# Patient Record
Sex: Female | Born: 1990 | Race: White | Hispanic: No | Marital: Single | Smoking: Former smoker
Health system: Southern US, Community
[De-identification: ages and names within clinical notes are randomized; demographics above are authoritative.]

## PROBLEM LIST (undated history)

## (undated) ENCOUNTER — Inpatient Hospital Stay (HOSPITAL_COMMUNITY): Payer: Self-pay

## (undated) DIAGNOSIS — G43909 Migraine, unspecified, not intractable, without status migrainosus: Secondary | ICD-10-CM

## (undated) DIAGNOSIS — N760 Acute vaginitis: Secondary | ICD-10-CM

## (undated) DIAGNOSIS — IMO0002 Reserved for concepts with insufficient information to code with codable children: Secondary | ICD-10-CM

## (undated) DIAGNOSIS — R87629 Unspecified abnormal cytological findings in specimens from vagina: Secondary | ICD-10-CM

## (undated) DIAGNOSIS — B9689 Other specified bacterial agents as the cause of diseases classified elsewhere: Secondary | ICD-10-CM

## (undated) DIAGNOSIS — A749 Chlamydial infection, unspecified: Secondary | ICD-10-CM

## (undated) DIAGNOSIS — N390 Urinary tract infection, site not specified: Secondary | ICD-10-CM

## (undated) DIAGNOSIS — Z8619 Personal history of other infectious and parasitic diseases: Secondary | ICD-10-CM

## (undated) HISTORY — DX: Urinary tract infection, site not specified: N39.0

## (undated) HISTORY — DX: Reserved for concepts with insufficient information to code with codable children: IMO0002

## (undated) HISTORY — DX: Chlamydial infection, unspecified: A74.9

## (undated) HISTORY — DX: Unspecified abnormal cytological findings in specimens from vagina: R87.629

---
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
## 2011-04-17 ENCOUNTER — Inpatient Hospital Stay (HOSPITAL_COMMUNITY)
Admission: AD | Admit: 2011-04-17 | Discharge: 2011-04-17 | Disposition: A | Discharge status: Home / Self Care | Payer: Medicaid Other | Source: Ambulatory Visit | Attending: Obstetrics & Gynecology | Admitting: Obstetrics & Gynecology

## 2011-04-17 ENCOUNTER — Encounter (HOSPITAL_COMMUNITY): Payer: Self-pay | Admitting: *Deleted

## 2011-04-17 ENCOUNTER — Inpatient Hospital Stay (HOSPITAL_COMMUNITY): Payer: Medicaid Other

## 2011-04-17 DIAGNOSIS — A499 Bacterial infection, unspecified: Secondary | ICD-10-CM

## 2011-04-17 DIAGNOSIS — B9689 Other specified bacterial agents as the cause of diseases classified elsewhere: Secondary | ICD-10-CM

## 2011-04-17 DIAGNOSIS — N760 Acute vaginitis: Secondary | ICD-10-CM

## 2011-04-17 DIAGNOSIS — O99891 Other specified diseases and conditions complicating pregnancy: Secondary | ICD-10-CM | POA: Insufficient documentation

## 2011-04-17 DIAGNOSIS — O98919 Unspecified maternal infectious and parasitic disease complicating pregnancy, unspecified trimester: Secondary | ICD-10-CM

## 2011-04-17 DIAGNOSIS — R109 Unspecified abdominal pain: Secondary | ICD-10-CM | POA: Insufficient documentation

## 2011-04-17 DIAGNOSIS — IMO0002 Reserved for concepts with insufficient information to code with codable children: Secondary | ICD-10-CM

## 2011-04-17 LAB — DIFFERENTIAL
Basophils Absolute: 0 10*3/uL (ref 0.0–0.1)
Basophils Relative: 0 % (ref 0–1)
Monocytes Relative: 10 % (ref 3–12)
Neutro Abs: 5.7 10*3/uL (ref 1.7–7.7)
Neutrophils Relative %: 54 % (ref 43–77)

## 2011-04-17 LAB — URINALYSIS, ROUTINE W REFLEX MICROSCOPIC
Bilirubin Urine: NEGATIVE
Ketones, ur: NEGATIVE mg/dL
Nitrite: NEGATIVE
pH: 6.5 (ref 5.0–8.0)

## 2011-04-17 LAB — HCG, QUANTITATIVE, PREGNANCY: hCG, Beta Chain, Quant, S: 17935 m[IU]/mL — ABNORMAL HIGH (ref <5)

## 2011-04-17 LAB — CBC
Hemoglobin: 14.2 g/dL (ref 12.0–15.0)
MCHC: 34.5 g/dL (ref 30.0–36.0)
RDW: 13.4 % (ref 11.5–15.5)

## 2011-04-17 LAB — WET PREP, GENITAL

## 2011-04-17 MED ORDER — METRONIDAZOLE 500 MG PO TABS: 500.0000 mg | ORAL_TABLET | Freq: Two times a day (BID) | ORAL | Status: AC

## 2011-04-17 MED ORDER — PROMETHAZINE HCL 25 MG PO TABS: 25.0000 mg | ORAL_TABLET | Freq: Four times a day (QID) | ORAL | Status: DC | PRN

## 2011-04-17 NOTE — Progress Notes (Signed)
Pt reports bleeding after intercourse 1 week ago. Pt also reports breast pain for 2 weeks and abdominal pain for 3 days.

## 2011-04-17 NOTE — ED Provider Notes (Signed)
History     CSN: [id number] Arrival date & time: 04/17/2011 12:16 AM  Chief Complaint  Patient presents with  . Abdominal Pain  . Breast Pain    HPI    HPI Courtney Strong is a 20 y.o. female who presents to [facility]U for abdominal pain in early pregnancy. She states she had a positive HPT and is concerned because of the amount of ETOH use she has had recently.  History reviewed. No pertinent past medical history.  Past Surgical History  Procedure Date  . Cesarean section     No family history on file.  History  Substance Use Topics  . Smoking status: Current Some Day Smoker -- 1.5 packs/day for 10 years    Types: Cigarettes  . Smokeless tobacco: Not on file  . Alcohol Use: Yes     social drinker, has been drinking recently - prior to knowing about pregnancy    OB History    Grav Para Term Preterm Abortions TAB SAB Ect Mult Living   2 1        1       Review of Systems  Review of Systems  Constitutional: Negative for fever, chills, diaphoresis and fatigue.  HENT: Negative for ear pain, congestion, sore throat, facial swelling, neck pain, neck stiffness, dental problem and sinus pressure.   Eyes: Negative for photophobia, pain and discharge.  Respiratory: Negative for cough, chest tightness and wheezing.   Gastrointestinal: Positive for nausea and abdominal pain. Negative for vomiting, diarrhea, constipation and abdominal distention.  Genitourinary: Positive for frequency and vaginal discharge. Negative for dysuria, flank pain, vaginal bleeding and difficulty urinating.  Musculoskeletal: Negative for myalgias, back pain and gait problem.  Skin: Negative for color change and rash.  Neurological: Negative for dizziness, speech difficulty, weakness, light-headedness, numbness and headaches.  Psychiatric/Behavioral: Negative for confusion and agitation.    Allergies  Review of patient's allergies indicates no known allergies.  Home Medications  No current outpatient  prescriptions on file.  Physical Exam    BP 131/65  Pulse 85  Temp(Src) 98.3 F (36.8 C) (Oral)  Resp 16  Ht 5\' 6"  (1.676 m)  Wt 150 lb 3.2 oz (68.13 kg)  BMI 24.24 kg/m2  LMP 01/10/2011  Breastfeeding? Unknown  Physical Exam  Nursing note and vitals reviewed. Constitutional: She is oriented to person, place, and time. She appears well-developed and well-nourished.  HENT:  Head: Normocephalic.  Eyes: EOM are normal.  Neck: Neck supple.  Pulmonary/Chest: Effort normal.  Abdominal: Soft. There is no tenderness.  Genitourinary:       Frothy vaginal discharge. Cervix closed, inflamed, no CMT, no adnexal tenderness. Uterus slightly enlarged.   Musculoskeletal: Normal range of motion.  Neurological: She is alert and oriented to person, place, and time. No cranial nerve deficit.  Skin: Skin is warm and dry.  Psychiatric: She has a normal mood and affect.    ED Course  Procedures  Results for orders placed during the hospital encounter of 04/17/11 (from the past 24 hour(s))  URINALYSIS, ROUTINE W REFLEX MICROSCOPIC     Status: Abnormal   Collection Time   04/17/11 12:53 AM      Component Value Range   Color, Urine YELLOW  YELLOW    Appearance CLEAR  CLEAR    Specific Gravity, Urine <1.005 (*) 1.005 - 1.030    pH 6.5  5.0 - 8.0    Glucose, UA NEGATIVE  NEGATIVE (mg/dL)   Hgb urine dipstick NEGATIVE  NEGATIVE  Bilirubin Urine NEGATIVE  NEGATIVE    Ketones, ur NEGATIVE  NEGATIVE (mg/dL)   Protein, ur NEGATIVE  NEGATIVE (mg/dL)   Urobilinogen, UA 0.2  0.0 - 1.0 (mg/dL)   Nitrite NEGATIVE  NEGATIVE    Leukocytes, UA NEGATIVE  NEGATIVE   POCT PREGNANCY, URINE     Status: Normal   Collection Time   04/17/11 12:55 AM      Component Value Range   Preg Test, Ur POSITIVE    CBC     Status: Abnormal   Collection Time   04/17/11  1:35 AM      Component Value Range   WBC 10.6 (*) 4.0 - 10.5 (K/uL)   RBC 4.71  3.87 - 5.11 (MIL/uL)   Hemoglobin 14.2  12.0 - 15.0 (g/dL)   HCT  16.1  09.6 - 04.5 (%)   MCV 87.3  78.0 - 100.0 (fL)   MCH 30.1  26.0 - 34.0 (pg)   MCHC 34.5  30.0 - 36.0 (g/dL)   RDW 40.9  81.1 - 91.4 (%)   Platelets 209  150 - 400 (K/uL)  DIFFERENTIAL     Status: Normal   Collection Time   04/17/11  1:35 AM      Component Value Range   Neutrophils Relative 54  43 - 77 (%)   Neutro Abs 5.7  1.7 - 7.7 (K/uL)   Lymphocytes Relative 34  12 - 46 (%)   Lymphs Abs 3.6  0.7 - 4.0 (K/uL)   Monocytes Relative 10  3 - 12 (%)   Monocytes Absolute 1.0  0.1 - 1.0 (K/uL)   Eosinophils Relative 2  0 - 5 (%)   Eosinophils Absolute 0.2  0.0 - 0.7 (K/uL)   Basophils Relative 0  0 - 1 (%)   Basophils Absolute 0.0  0.0 - 0.1 (K/uL)  HCG, QUANTITATIVE, PREGNANCY     Status: Abnormal   Collection Time   04/17/11  1:35 AM      Component Value Range   hCG, Beta Chain, Quant, S 17935 (*) <5 (mIU/mL)      *RADIOLOGY REPORT*  Clinical Data: Pelvic pain.  OBSTETRIC <14 WK [place] AND TRANSVAGINAL OB US  Technique: Both transabdominal and transvaginal ultrasound  examinations were performed for complete evaluation of the  gestation as well as the maternal uterus, adnexal regions, and  pelvic cul-de-sac. Transvaginal technique was performed to assess  early pregnancy.  Comparison: None.  Intrauterine gestational sac: Visualized/normal in shape.  Yolk sac: Yes  Embryo: Yes  Cardiac Activity: Yes  Heart Rate: 111 bpm  CRL: 4.4 mm 6 w 1 d [place] EDC: 12/10/2011  Maternal uterus/adnexae:  A small to moderate amount of subchorionic hemorrhage is noted.  The uterus otherwise unremarkable in appearance.  The ovaries are unremarkable in appearance. The right ovary  measures 2.9 x 1.9 x 1.7 cm, while the left ovary measures 2.8 x  2.3 x 1.9 cm. No suspicious adnexal masses are seen; there is no  evidence for ovarian torsion.  No free fluid is seen within the pelvic cul-de-sac.  IMPRESSION:  1. Single live intrauterine pregnancy, with a crown-rump length of  4.4 mm,  corresponding to a gestational age of [redacted] weeks 1 day. This  does not match the gestational age of [redacted] weeks 1 day by LMP, and  reflects a new estimated date of delivery of Dec 10, 2011.  2. Small to moderate amount of subchorionic hemorrhage noted.  Original Report Authenticated By: Tonia Ghent, M.D.  Assessment: Viable IUP  Plan:  Start prenatal care   Stop ETOH use          [place], [place] 04/17/11 [phone]

## 2011-04-17 NOTE — Progress Notes (Signed)
Pt G1 P1, unsure LMP, having lower abd x 4 days and breast pain 2-3 wks.

## 2011-04-18 LAB — GC/CHLAMYDIA PROBE AMP, GENITAL: Chlamydia, DNA Probe: NEGATIVE

## 2011-04-25 LAB — POCT URINALYSIS DIP (DEVICE)
Nitrite: NEGATIVE
Operator id: 29721
Protein, ur: 30 mg/dL — AB
Urobilinogen, UA: 0.2 mg/dL (ref 0.0–1.0)

## 2011-04-25 LAB — POCT PREGNANCY, URINE: Preg Test, Ur: NEGATIVE

## 2011-05-04 ENCOUNTER — Encounter (HOSPITAL_COMMUNITY): Payer: Self-pay | Admitting: *Deleted

## 2011-05-04 ENCOUNTER — Inpatient Hospital Stay (HOSPITAL_COMMUNITY)
Admission: AD | Admit: 2011-05-04 | Discharge: 2011-05-05 | Disposition: A | Discharge status: Home / Self Care | Payer: Medicaid Other | Source: Ambulatory Visit | Attending: Obstetrics & Gynecology | Admitting: Obstetrics & Gynecology

## 2011-05-04 DIAGNOSIS — M545 Low back pain: Secondary | ICD-10-CM

## 2011-05-04 DIAGNOSIS — T148XXA Other injury of unspecified body region, initial encounter: Secondary | ICD-10-CM

## 2011-05-04 DIAGNOSIS — O2340 Unspecified infection of urinary tract in pregnancy, unspecified trimester: Secondary | ICD-10-CM

## 2011-05-04 DIAGNOSIS — O2390 Unspecified genitourinary tract infection in pregnancy, unspecified trimester: Secondary | ICD-10-CM | POA: Insufficient documentation

## 2011-05-04 DIAGNOSIS — N390 Urinary tract infection, site not specified: Secondary | ICD-10-CM | POA: Insufficient documentation

## 2011-05-04 DIAGNOSIS — M549 Dorsalgia, unspecified: Secondary | ICD-10-CM | POA: Insufficient documentation

## 2011-05-04 MED ORDER — OXYCODONE-ACETAMINOPHEN 5-325 MG PO TABS
1.0000 | ORAL_TABLET | Freq: Once | ORAL | Status: AC
  Administered 2011-05-04: 1 via ORAL
  Filled 2011-05-04: qty 1

## 2011-05-04 MED ORDER — CYCLOBENZAPRINE HCL 10 MG PO TABS
10.0000 mg | ORAL_TABLET | Freq: Once | ORAL | Status: AC
  Administered 2011-05-04: 10 mg via ORAL
  Filled 2011-05-04: qty 1

## 2011-05-04 NOTE — Progress Notes (Signed)
PT SAYS  TODAY AT 1PM   WAS HELPING  HER DAD PICK UP A 75 LB TOOLBOX- MOVED AND NOW  LEFT LOWER BACK HURTS- HAS BEEN  USING HEATING PAD -  WITH NO RELIEF. NO PAIN MED.   WENT TO DR MARSHALL WITH LAST PREG-  PLANS TO MAKE APPOINTMENT  TOMORROW WITH [facility]

## 2011-05-04 NOTE — Progress Notes (Signed)
PT  WAS HERE IN SEPT- FOR PAIN- WE TOLD HER - PREG.

## 2011-05-04 NOTE — ED Provider Notes (Signed)
History     No chief complaint on file.  HPI G2P1 at [redacted]w[redacted]d. Picked up heavy tool box earlier, left lower back began hurting and twisting/bending movement became limited shortly thereafter. Pain seems to be directly related to lifting - no dysuria, fever, chills, nausea, vomiting.  OB History    Grav Para Term Preterm Abortions TAB SAB Ect Mult Living   2 1        1       History reviewed. No pertinent past medical history.  Past Surgical History  Procedure Date  . Cesarean section     History reviewed. No pertinent family history.  History  Substance Use Topics  . Smoking status: Former Smoker -- 1.5 packs/day for 10 years    Types: Cigarettes    Quit date: 05/03/2011  . Smokeless tobacco: Not on file  . Alcohol Use: Yes     social drinker, has been drinking in September - prior to knowing about pregnancy    Allergies: No Known Allergies  No prescriptions prior to admission    Review of Systems  Respiratory: Negative.   Cardiovascular: Negative.   Gastrointestinal: Negative.  Negative for nausea and vomiting.  Genitourinary: Negative.  Negative for dysuria, frequency and hematuria.  Musculoskeletal: Positive for back pain.  Neurological: Negative.   Psychiatric/Behavioral: Negative.    Physical Exam   Blood pressure 107/41, pulse 73, temperature 98.1 F (36.7 C), temperature source Oral, resp. rate 20, height 5\' 4"  (1.626 m), weight 155 lb 4 oz (70.421 kg), last menstrual period 01/10/2011, unknown if currently breastfeeding.  Physical Exam  Nursing note and vitals reviewed. Constitutional: She is oriented to person, place, and time. She appears well-developed and well-nourished. She appears distressed.  Cardiovascular: Normal rate.   Respiratory: Effort normal.  GI: There is no CVA tenderness.  Musculoskeletal:       Lumbar back: She exhibits decreased range of motion, tenderness, pain and spasm.       Back:  Neurological: She is alert and oriented to  person, place, and time.  Skin: Skin is warm and dry.  Psychiatric: She has a normal mood and affect.    MAU Course  Procedures  Results for orders placed during the hospital encounter of 05/04/11 (from the past 24 hour(s))  URINALYSIS, ROUTINE W REFLEX MICROSCOPIC     Status: Abnormal   Collection Time   05/04/11 11:25 PM      Component Value Range   Color, Urine YELLOW  YELLOW    Appearance HAZY (*) CLEAR    Specific Gravity, Urine 1.025  1.005 - 1.030    pH 6.0  5.0 - 8.0    Glucose, UA NEGATIVE  NEGATIVE (mg/dL)   Hgb urine dipstick NEGATIVE  NEGATIVE    Bilirubin Urine NEGATIVE  NEGATIVE    Ketones, ur NEGATIVE  NEGATIVE (mg/dL)   Protein, ur NEGATIVE  NEGATIVE (mg/dL)   Urobilinogen, UA 2.0 (*) 0.0 - 1.0 (mg/dL)   Nitrite POSITIVE (*) NEGATIVE    Leukocytes, UA NEGATIVE  NEGATIVE   URINE MICROSCOPIC-ADD ON     Status: Abnormal   Collection Time   05/04/11 11:25 PM      Component Value Range   Squamous Epithelial / LPF MANY (*) RARE    WBC, UA 3-6  <3 (WBC/hpf)   Bacteria, UA MANY (*) RARE    Urine-Other MUCOUS PRESENT      Improvement in pain after Flexeril, Percocet   Assessment and Plan  Back pain - no CVA  tenderness, likely due to muscle strain/spasm, rx flexeril and percocet, comfort measures rev'd UTI - rx Keflex, urine culture sent, precautions rev'd  Courtney Strong 05/05/2011, 12:11 AM

## 2011-05-05 LAB — URINE MICROSCOPIC-ADD ON

## 2011-05-05 LAB — URINALYSIS, ROUTINE W REFLEX MICROSCOPIC
Ketones, ur: NEGATIVE mg/dL
Leukocytes, UA: NEGATIVE
Nitrite: POSITIVE — AB
Protein, ur: NEGATIVE mg/dL

## 2011-05-05 MED ORDER — OXYCODONE-ACETAMINOPHEN 5-325 MG PO TABS: 1.0000 | ORAL_TABLET | Freq: Four times a day (QID) | ORAL | Status: DC | PRN

## 2011-05-05 MED ORDER — CYCLOBENZAPRINE HCL 10 MG PO TABS: 10.0000 mg | ORAL_TABLET | Freq: Three times a day (TID) | ORAL | Status: AC | PRN

## 2011-05-05 MED ORDER — CEPHALEXIN 500 MG PO CAPS: 500.0000 mg | ORAL_CAPSULE | Freq: Three times a day (TID) | ORAL | Status: AC

## 2011-05-05 NOTE — Progress Notes (Signed)
Pt  Admits to lifting heavy tool box earlier today. Pt states she has been laying in the bed on a heating pad. Since 1400. Pt states pain in back is "excruciating".

## 2011-05-06 NOTE — ED Provider Notes (Signed)
Agree with above note.  Courtney Strong H. 05/06/2011 6:49 AM

## 2011-08-07 ENCOUNTER — Inpatient Hospital Stay (HOSPITAL_COMMUNITY)
Admission: AD | Admit: 2011-08-07 | Discharge: 2011-08-08 | Disposition: A | Discharge status: Home / Self Care | Payer: Medicaid Other | Source: Ambulatory Visit | Attending: Obstetrics & Gynecology | Admitting: Obstetrics & Gynecology

## 2011-08-07 ENCOUNTER — Encounter (HOSPITAL_COMMUNITY): Payer: Self-pay | Admitting: *Deleted

## 2011-08-07 DIAGNOSIS — R109 Unspecified abdominal pain: Secondary | ICD-10-CM | POA: Insufficient documentation

## 2011-08-07 LAB — URINALYSIS, ROUTINE W REFLEX MICROSCOPIC
Bilirubin Urine: NEGATIVE
Nitrite: NEGATIVE
Specific Gravity, Urine: 1.015 (ref 1.005–1.030)
pH: 7 (ref 5.0–8.0)

## 2011-08-07 LAB — URINE MICROSCOPIC-ADD ON

## 2011-08-07 NOTE — Progress Notes (Signed)
Pt states she had a TAB at [facility] on Nov 27th,2012-states has been bleeding since then and has developed a sharp pain in her rt lower groin area

## 2011-08-08 NOTE — Progress Notes (Signed)
Pt called in lobby. No response 

## 2011-08-08 NOTE — Progress Notes (Signed)
Called pt name in lobby. No response.

## 2011-09-22 ENCOUNTER — Emergency Department (HOSPITAL_COMMUNITY)
Admission: EM | Admit: 2011-09-22 | Discharge: 2011-09-23 | Disposition: A | Discharge status: Home / Self Care | Payer: Medicaid Other | Attending: Emergency Medicine | Admitting: Emergency Medicine

## 2011-09-22 ENCOUNTER — Encounter (HOSPITAL_COMMUNITY): Payer: Self-pay | Admitting: *Deleted

## 2011-09-22 DIAGNOSIS — J029 Acute pharyngitis, unspecified: Secondary | ICD-10-CM | POA: Insufficient documentation

## 2011-09-22 DIAGNOSIS — R599 Enlarged lymph nodes, unspecified: Secondary | ICD-10-CM | POA: Insufficient documentation

## 2011-09-22 LAB — RAPID STREP SCREEN (MED CTR MEBANE ONLY): Streptococcus, Group A Screen (Direct): NEGATIVE

## 2011-09-22 NOTE — ED Notes (Signed)
Pt reports having a sore throat since yesterday.  Difficulty and painful swallowing.  Pt sleeping on RN assessment.  Throat noted to be red and inflammed.

## 2011-09-22 NOTE — ED Notes (Signed)
C/o sore throat, difficulty swallowing, R sided anterior neck pain, "like a golf ball" (noted/palpable), also bilateral ear pain, onset last night, (denies: fever, nvd). Has tried dayquil at 1100, aleve at 0600. 10/10 pain, throat moderately swollen and red, no obvious exudate.

## 2011-09-23 ENCOUNTER — Encounter (HOSPITAL_COMMUNITY): Payer: Self-pay | Admitting: *Deleted

## 2011-09-23 MED ORDER — CEPHALEXIN 500 MG PO CAPS: 500.0000 mg | ORAL_CAPSULE | Freq: Four times a day (QID) | ORAL | Status: AC

## 2011-09-23 MED ORDER — PREDNISONE 10 MG PO TABS: 20.0000 mg | ORAL_TABLET | Freq: Two times a day (BID) | ORAL | Status: DC

## 2011-09-23 NOTE — ED Provider Notes (Signed)
History     CSN: [id number]  Arrival date & time 09/22/11  2112   First MD Initiated Contact with Patient 09/23/11 0059      Chief Complaint  Patient presents with  . Sore Throat    (Consider location/radiation/quality/duration/timing/severity/associated sxs/prior treatment) Patient is a 21 y.o. female presenting with pharyngitis. The history is provided by the patient.  Sore Throat This is a new problem. The current episode started yesterday. The problem occurs constantly. The problem has been gradually worsening. The symptoms are aggravated by swallowing. The symptoms are relieved by nothing. She has tried nothing for the symptoms.    History reviewed. No pertinent past medical history.  Past Surgical History  Procedure Date  . Cesarean section     History reviewed. No pertinent family history.  History  Substance Use Topics  . Smoking status: Current Some Day Smoker -- 1.0 packs/day for 10 years    Types: Cigarettes    Last Attempt to Quit: 05/03/2011  . Smokeless tobacco: Not on file  . Alcohol Use: Yes     social drinker, has been drinking in September - prior to knowing about pregnancy    OB History    Grav Para Term Preterm Abortions TAB SAB Ect Mult Living   2 1        1       Review of Systems  All other systems reviewed and are negative.    Allergies  Review of patient's allergies indicates no known allergies.  Home Medications   Current Outpatient Rx  Name Route Sig Dispense Refill  . ASPIRIN-ACETAMINOPHEN-CAFFEINE 250-250-65 MG PO TABS Oral Take 1 tablet by mouth every 6 (six) hours as needed. For pain    . NAPROXEN SODIUM 220 MG PO TABS Oral Take 220 mg by mouth 2 (two) times daily with a meal. For pain      BP 117/68  Pulse 85  Temp(Src) 97.9 F (36.6 C) (Oral)  Resp 16  SpO2 98%  LMP 09/21/2011  Physical Exam  Nursing note and vitals reviewed. Constitutional: She is oriented to person, place, and time. She appears well-developed and  well-nourished. No distress.  HENT:  Head: Normocephalic and atraumatic.  Right Ear: External ear normal.  Left Ear: External ear normal.       The po is erythematous.  No exudates.  Neck: Normal range of motion. Neck supple.  Musculoskeletal: Normal range of motion.  Lymphadenopathy:    She has cervical adenopathy.  Neurological: She is alert and oriented to person, place, and time.  Skin: Skin is warm and dry. She is not diaphoretic.    ED Course  Procedures (including critical care time)   Labs Reviewed  RAPID STREP SCREEN   No results found.   No diagnosis found.    MDM          Geoffery Lyons, MD 09/23/11 [phone]

## 2012-01-02 ENCOUNTER — Inpatient Hospital Stay (HOSPITAL_COMMUNITY)
Admission: AD | Admit: 2012-01-02 | Discharge: 2012-01-03 | Disposition: A | Discharge status: Home / Self Care | Payer: Medicaid Other | Source: Ambulatory Visit | Attending: Family Medicine | Admitting: Family Medicine

## 2012-01-02 ENCOUNTER — Encounter (HOSPITAL_COMMUNITY): Payer: Self-pay

## 2012-01-02 DIAGNOSIS — O99891 Other specified diseases and conditions complicating pregnancy: Secondary | ICD-10-CM | POA: Insufficient documentation

## 2012-01-02 DIAGNOSIS — R197 Diarrhea, unspecified: Secondary | ICD-10-CM | POA: Insufficient documentation

## 2012-01-02 DIAGNOSIS — R109 Unspecified abdominal pain: Secondary | ICD-10-CM | POA: Insufficient documentation

## 2012-01-02 DIAGNOSIS — Z3490 Encounter for supervision of normal pregnancy, unspecified, unspecified trimester: Secondary | ICD-10-CM

## 2012-01-02 DIAGNOSIS — R1031 Right lower quadrant pain: Secondary | ICD-10-CM

## 2012-01-02 DIAGNOSIS — N912 Amenorrhea, unspecified: Secondary | ICD-10-CM | POA: Insufficient documentation

## 2012-01-02 LAB — URINE MICROSCOPIC-ADD ON

## 2012-01-02 LAB — URINALYSIS, ROUTINE W REFLEX MICROSCOPIC
Bilirubin Urine: NEGATIVE
Hgb urine dipstick: NEGATIVE
Ketones, ur: NEGATIVE mg/dL
Nitrite: NEGATIVE
pH: 6 (ref 5.0–8.0)

## 2012-01-02 NOTE — MAU Provider Note (Signed)
History     CSN: [id number]  Arrival date and time: 01/02/12 2249   First Provider Initiated Contact with Patient 01/02/12 2338      Chief Complaint  Patient presents with  . Diarrhea  . Abdominal Pain   HPI This is a 21 y.o. female at [redacted]w[redacted]d who presents with c/o abdominal pain, amenorrhea, and diarrhea for 2 weeks. States had a fever of 103 the other day but none since. Has not been evaluated for any of this.   OB History    Grav Para Term Preterm Abortions TAB SAB Ect Mult Living   3 1   1 1    1       History reviewed. No pertinent past medical history.  Past Surgical History  Procedure Date  . Cesarean section     No family history on file.  History  Substance Use Topics  . Smoking status: Current Some Day Smoker -- 1.0 packs/day for 10 years    Types: Cigarettes    Last Attempt to Quit: 05/03/2011  . Smokeless tobacco: Not on file  . Alcohol Use: Yes     social drinker, has been drinking in September - prior to knowing about pregnancy    Allergies: No Known Allergies  Prescriptions prior to admission  Medication Sig Dispense Refill  . loperamide (IMODIUM) 2 MG capsule Take 2 mg by mouth 4 (four) times daily as needed.      Marland Kitchen aspirin-acetaminophen-caffeine (EXCEDRIN MIGRAINE) 250-250-65 MG per tablet Take 1 tablet by mouth every 6 (six) hours as needed. For pain      . naproxen sodium (ANAPROX) 220 MG tablet Take 220 mg by mouth 2 (two) times daily with a meal. For pain      . predniSONE (DELTASONE) 10 MG tablet Take 2 tablets (20 mg total) by mouth 2 (two) times daily.  20 tablet  0    ROS As listed in HPI  Physical Exam   Blood pressure 123/65, pulse 73, temperature 98.5 F (36.9 C), temperature source Oral, resp. rate 18, height 5\' 6"  (1.676 m), weight 142 lb (64.411 kg), last menstrual period 11/20/2011, SpO2 98.00%.  Physical Exam  Constitutional: She is oriented to person, place, and time. She appears well-developed and well-nourished. No  distress.  HENT:  Head: Normocephalic.  Cardiovascular: Normal rate.   Respiratory: Effort normal.  GI: Soft. She exhibits no distension and no mass. There is no tenderness. There is no rebound and no guarding.  Genitourinary: Vagina normal and uterus normal. No vaginal discharge found.  Musculoskeletal: Normal range of motion.  Neurological: She is alert and oriented to person, place, and time.  Skin: Skin is warm and dry.  Psychiatric: She has a normal mood and affect.    MAU Course  Procedures  MDM Cultures done. Will check CBC and CMET due to prolonged diarrhea.  Results for orders placed during the hospital encounter of 01/02/12 (from the past 24 hour(s))  URINALYSIS, ROUTINE W REFLEX MICROSCOPIC     Status: Abnormal   Collection Time   01/02/12 11:10 PM      Component Value Range   Color, Urine YELLOW  YELLOW   APPearance CLEAR  CLEAR   Specific Gravity, Urine 1.020  1.005 - 1.030   pH 6.0  5.0 - 8.0   Glucose, UA NEGATIVE  NEGATIVE mg/dL   Hgb urine dipstick NEGATIVE  NEGATIVE   Bilirubin Urine NEGATIVE  NEGATIVE   Ketones, ur NEGATIVE  NEGATIVE mg/dL   Protein, ur  NEGATIVE  NEGATIVE mg/dL   Urobilinogen, UA 2.0 (*) 0.0 - 1.0 mg/dL   Nitrite NEGATIVE  NEGATIVE   Leukocytes, UA TRACE (*) NEGATIVE  URINE MICROSCOPIC-ADD ON     Status: Abnormal   Collection Time   01/02/12 11:10 PM      Component Value Range   Squamous Epithelial / LPF FEW (*) RARE   WBC, UA 3-6  <3 WBC/hpf   RBC / HPF 0-2  <3 RBC/hpf   Bacteria, UA RARE  RARE   Urine-Other MUCOUS PRESENT    POCT PREGNANCY, URINE     Status: Abnormal   Collection Time   01/02/12 11:23 PM      Component Value Range   Preg Test, Ur POSITIVE (*) NEGATIVE  WET PREP, GENITAL     Status: Abnormal   Collection Time   01/02/12 11:40 PM      Component Value Range   Yeast Wet Prep HPF POC NONE SEEN  NONE SEEN   Trich, Wet Prep NONE SEEN  NONE SEEN   Clue Cells Wet Prep HPF POC NONE SEEN  NONE SEEN   WBC, Wet Prep HPF POC  MODERATE (*) NONE SEEN  CBC     Status: Normal   Collection Time   01/02/12 11:50 PM      Component Value Range   WBC 8.9  4.0 - 10.5 K/uL   RBC 4.43  3.87 - 5.11 MIL/uL   Hemoglobin 13.2  12.0 - 15.0 g/dL   HCT 16.1  09.6 - 04.5 %   MCV 87.6  78.0 - 100.0 fL   MCH 29.8  26.0 - 34.0 pg   MCHC 34.0  30.0 - 36.0 g/dL   RDW 40.9  81.1 - 91.4 %   Platelets 221  150 - 400 K/uL  DIFFERENTIAL     Status: Normal   Collection Time   01/02/12 11:50 PM      Component Value Range   Neutrophils Relative 50  43 - 77 %   Neutro Abs 4.4  1.7 - 7.7 K/uL   Lymphocytes Relative 37  12 - 46 %   Lymphs Abs 3.2  0.7 - 4.0 K/uL   Monocytes Relative 10  3 - 12 %   Monocytes Absolute 0.9  0.1 - 1.0 K/uL   Eosinophils Relative 3  0 - 5 %   Eosinophils Absolute 0.3  0.0 - 0.7 K/uL   Basophils Relative 1  0 - 1 %   Basophils Absolute 0.0  0.0 - 0.1 K/uL  COMPREHENSIVE METABOLIC PANEL     Status: Abnormal   Collection Time   01/02/12 11:50 PM      Component Value Range   Sodium 137  135 - 145 mEq/L   Potassium 3.5  3.5 - 5.1 mEq/L   Chloride 103  96 - 112 mEq/L   CO2 27  19 - 32 mEq/L   Glucose, Bld 81  70 - 99 mg/dL   BUN 9  6 - 23 mg/dL   Creatinine, Ser 7.82  0.50 - 1.10 mg/dL   Calcium 9.5  8.4 - 95.6 mg/dL   Total Protein 6.8  6.0 - 8.3 g/dL   Albumin 3.9  3.5 - 5.2 g/dL   AST 14  0 - 37 U/L   ALT 11  0 - 35 U/L   Alkaline Phosphatase 57  39 - 117 U/L   Total Bilirubin 0.2 (*) 0.3 - 1.2 mg/dL   GFR calc non Af Amer >90  >  90 mL/min   GFR calc Af Amer >90  >90 mL/min  HCG, QUANTITATIVE, PREGNANCY     Status: Abnormal   Collection Time   01/02/12 11:50 PM      Component Value Range   hCG, Beta Chain, Quant, S 1975 (*) <5 mIU/mL   US Ob Comp Less 14 Wks  01/03/2012  *RADIOLOGY REPORT*  Clinical Data: Right lower quadrant pain/cramping.  OBSTETRIC <14 WK ULTRASOUND  Technique:  Transabdominal and transvaginal ultrasound was performed for evaluation of the gestation as well as the maternal  uterus and adnexal regions.  Comparison:  None.  Intrauterine gestational sac: Suggested/normal in shape. Yolk sac: Not identified Embryo: Not identified Cardiac Activity: Not applicable  MSD: 3.3 mm  4w  6d [place] EDC: 09/05/2012  Maternal uterus/Adnexae: Small subchorionic hemorrhage.  Normal sonographic appearance to the ovaries.    IMPRESSION: Suggestion of a single intrauterine gestational sac.  No yolk sac or embryo at this time which may be secondary to the early timing of the examination.  Recommend correlation with serial quantitative beta HCG and ultrasound follow-up as warranted.  There is a small subchorionic hemorrhage.  No free fluid or adnexal abnormality identified.  Original Report Authenticated By: Waneta Martins, M.D.     Assessment and Plan  A:  Pregnancy at 4.6 weeks       Unable to rule out ectopic yet       Diarrhea for 2 weeks, unknown etiology        P:  Discharge home      OBservation       Encouraged to seek further eval with her family doctor regarding diarrhea. Will need cultures.      Recommend repeat Quant in 48 hrs.       May need new [place] in 7-10 days.    [facility] 01/02/2012, 11:45 PM

## 2012-01-02 NOTE — MAU Note (Signed)
Pt reports painful diarrhea x 2 1/2 weeks, lower abd cramping. 2 weeks. LMP 11/20/2011

## 2012-01-03 ENCOUNTER — Inpatient Hospital Stay (HOSPITAL_COMMUNITY): Payer: Medicaid Other

## 2012-01-03 ENCOUNTER — Encounter (HOSPITAL_COMMUNITY): Payer: Self-pay

## 2012-01-03 DIAGNOSIS — R1031 Right lower quadrant pain: Secondary | ICD-10-CM

## 2012-01-03 LAB — DIFFERENTIAL
Basophils Absolute: 0 10*3/uL (ref 0.0–0.1)
Basophils Relative: 1 % (ref 0–1)
Eosinophils Absolute: 0.3 10*3/uL (ref 0.0–0.7)
Eosinophils Relative: 3 % (ref 0–5)
Lymphocytes Relative: 37 % (ref 12–46)
Lymphs Abs: 3.2 10*3/uL (ref 0.7–4.0)
Monocytes Absolute: 0.9 10*3/uL (ref 0.1–1.0)
Monocytes Relative: 10 % (ref 3–12)
Neutro Abs: 4.4 10*3/uL (ref 1.7–7.7)
Neutrophils Relative %: 50 % (ref 43–77)

## 2012-01-03 LAB — COMPREHENSIVE METABOLIC PANEL
ALT: 11 U/L (ref 0–35)
AST: 14 U/L (ref 0–37)
Albumin: 3.9 g/dL (ref 3.5–5.2)
Alkaline Phosphatase: 57 U/L (ref 39–117)
BUN: 9 mg/dL (ref 6–23)
CO2: 27 mEq/L (ref 19–32)
Calcium: 9.5 mg/dL (ref 8.4–10.5)
Chloride: 103 mEq/L (ref 96–112)
Creatinine, Ser: 0.76 mg/dL (ref 0.50–1.10)
GFR calc Af Amer: >90 mL/min (ref >90)
GFR calc non Af Amer: >90 mL/min (ref >90)
Glucose, Bld: 81 mg/dL (ref 70–99)
Potassium: 3.5 mEq/L (ref 3.5–5.1)
Sodium: 137 mEq/L (ref 135–145)
Total Bilirubin: 0.2 mg/dL — ABNORMAL LOW (ref 0.3–1.2)
Total Protein: 6.8 g/dL (ref 6.0–8.3)

## 2012-01-03 LAB — WET PREP, GENITAL
Clue Cells Wet Prep HPF POC: NONE SEEN
Trich, Wet Prep: NONE SEEN

## 2012-01-03 LAB — CBC
HCT: 38.8 % (ref 36.0–46.0)
Platelets: 221 10*3/uL (ref 150–400)
RDW: 12.9 % (ref 11.5–15.5)
WBC: 8.9 10*3/uL (ref 4.0–10.5)

## 2012-01-03 NOTE — MAU Provider Note (Signed)
Chart reviewed and agree with management and plan.  

## 2012-01-03 NOTE — Discharge Instructions (Signed)
Abdominal Pain During Pregnancy °Belly (abdominal) pain is common during pregnancy. Most of the time, it is not a serious problem. Other times, it can be a sign that something is wrong with the pregnancy. Always tell your doctor if you have belly pain. °HOME CARE °For mild pain: °· Do not have sex (intercourse) or put anything in your vagina until you feel better.  °· Rest until your pain stops. If your pain lasts longer than 1 hour, call your doctor.  °· Drink clear fluids if you feel sick to your stomach (nauseous).  °· Do not eat solid food until you feel better.  °· Only take medicine as told by your doctor.  °· Keep all doctor visits as told.  °GET HELP RIGHT AWAY IF:  °· You are bleeding, leaking fluid, or pieces of tissue come out of your vagina.  °· You have more pain or cramping.  °· You keep throwing up (vomiting).  °· You have pain when you pee (urinate) or have blood in your pee.  °· You have a fever.  °· You do not feel your baby moving as much.  °· You feel very weak or feel like passing out.  °· You have trouble breathing, with or without belly pain.  °· You have a very bad headache and belly pain.  °· You have fluid leaking from your vagina and belly pain.  °· You keep having watery poop (diarrhea).  °· Your belly pain does not go away after resting, or the pain gets worse.  °MAKE SURE YOU:  °· Understand these instructions.  °· Will watch your condition.  °· Will get help right away if you are not doing well or get worse.  °Document Released: 06/28/2009 Document Revised: 06/29/2011 Document Reviewed: 02/03/2011 °[employer]® Patient Information ©2012 [employer]. °

## 2012-01-06 ENCOUNTER — Inpatient Hospital Stay (HOSPITAL_COMMUNITY): Payer: Medicaid Other

## 2012-01-06 ENCOUNTER — Inpatient Hospital Stay (HOSPITAL_COMMUNITY)
Admission: AD | Admit: 2012-01-06 | Discharge: 2012-01-06 | Disposition: A | Discharge status: Home / Self Care | Payer: Medicaid Other | Source: Ambulatory Visit | Attending: Obstetrics & Gynecology | Admitting: Obstetrics & Gynecology

## 2012-01-06 DIAGNOSIS — O039 Complete or unspecified spontaneous abortion without complication: Secondary | ICD-10-CM | POA: Insufficient documentation

## 2012-01-06 LAB — CBC
Hemoglobin: 13.7 g/dL (ref 12.0–15.0)
MCH: 29.8 pg (ref 26.0–34.0)
MCHC: 33.7 g/dL (ref 30.0–36.0)
Platelets: 210 10*3/uL (ref 150–400)
RBC: 4.59 MIL/uL (ref 3.87–5.11)

## 2012-01-06 LAB — HCG, QUANTITATIVE, PREGNANCY: hCG, Beta Chain, Quant, S: 1573 m[IU]/mL — ABNORMAL HIGH (ref <5)

## 2012-01-06 NOTE — Discharge Instructions (Signed)
SOMEONE WILL CALL YOU FROM THE GYN OFFICE TO SCHEDULE YOU FOR A FOLLOW UP APPOINTMENT.  Miscarriage An early pregnancy loss or spontaneous abortion (miscarriage) is a common problem. This usually happens when the pregnancy is not developing normally. It is very unlikely that you or your partner did anything to cause this, although cigarette smoking, a sexually transmitted disease, excessive alcohol use, or drug abuse can increase the risk. Other causes are:  Abnormalities of the uterus.   Hormone or medical problems.   Trauma or genetic (chromosome) problems.  Having a miscarriage does not change your chances of having a normal pregnancy in the future. Your caregiver will advise you when it is safe to try to get pregnant again. AFTER A MISCARRIAGE  A miscarriage is inevitable when there is continual, heavy vaginal bleeding; cramping; dilation of the cervix; or passing of any pregnancy tissue. Bleeding and cramping will usually continue until all the tissue has been removed from the womb (uterus).   Often the uterus does not clean itself out completely. A medication or a D&C procedure is needed to loosen or remove the pregnancy tissue from the uterus. A D&C scrapes or suctions the tissue out.   If you are RH negative, you may need to have Rh immune globulin to avoid Rh problems.   You may be given medication to fight an infection if the miscarriage was due to an infection.  HOME CARE INSTRUCTIONS   You should rest in bed for the next 2 to 3 days.   Do not take tub baths or put anything in your vagina, including tampons or a douche.   Do not have sex until your caregiver approves.   Avoid exercise or heavy activities until directed by your caregiver.   Save any vaginal discharge that looks like tissue. Ask your caregiver if he or she wants to inspect the discharge.   If you and your partner are having problems with guilt or grieving, talk to your caregiver or get counseling to help you  understand and cope with your pregnancy loss.   Allow enough time to grieve before trying to get pregnant again.  SEEK IMMEDIATE MEDICAL CARE IF:   You have persistent heavy bleeding or a bad smelling vaginal discharge.   You have continued abdominal or pelvic pain.   You have an oral temperature above 102 F (38.9 C), not controlled by medicine.   You have severe weakness, fainting, or keep throwing up (vomiting).   You develop chills.   You are experiencing domestic violence.  MAKE SURE YOU:   Understand these instructions.   Will watch your condition.   Will get help right away if you are not doing well or get worse.  Document Released: 08/17/2004 Document Revised: 06/29/2011 Document Reviewed: 07/02/2008 [facility] Patient Information 2012 [place], [place].

## 2012-01-06 NOTE — MAU Note (Signed)
Onset of bleeding shortly before presenting to MAU small to moderate amount of bleeding noted patient states she had blood all in the toilet and cramping in lower abdomen.

## 2012-01-06 NOTE — MAU Note (Signed)
Pt states awoke about 30 mins ago and went to [facility]. Was having vag bleeding and just came straight to hosp. States blood filled the toilet. Pt teary. Cramping

## 2012-01-06 NOTE — MAU Provider Note (Signed)
History     CSN: [id number]  Arrival date and time: 01/06/12 0645   None     No chief complaint on file.  HPI Pt is G3 TAB1 SAB1 with vaginal bleeding.  She was seen on 6/11 with abd pain, diarrhea and amenorrhea with positive pregnancy test and US revealing possible IUGS [redacted]w[redacted]d.  She was to return in 2 to 3 days for repeat HCG, which pt was planning on doing this morning.  Pt is A positive from review of previous labs.Pt started bleeding shortly before admission to MAU filling the toilet with then some cramping. Pt has not passed any tissue or clots.  No past medical history on file.  Past Surgical History  Procedure Date  . Cesarean section     No family history on file.  History  Substance Use Topics  . Smoking status: Current Some Day Smoker -- 1.0 packs/day for 10 years    Types: Cigarettes    Last Attempt to Quit: 05/03/2011  . Smokeless tobacco: Not on file  . Alcohol Use: Yes     social drinker, has been drinking in September - prior to knowing about pregnancy    Allergies: No Known Allergies  Prescriptions prior to admission  Medication Sig Dispense Refill  . loperamide (IMODIUM) 2 MG capsule Take 2 mg by mouth 4 (four) times daily as needed.        Review of Systems  Constitutional: Negative for fever and chills.  Gastrointestinal: Positive for abdominal pain. Negative for nausea, vomiting, diarrhea and constipation.  Genitourinary: Negative for dysuria, urgency and frequency.  Neurological: Negative for headaches.   Physical Exam   Last menstrual period 11/20/2011.  Physical Exam  Vitals reviewed. Constitutional: She is oriented to person, place, and time. She appears well-developed and well-nourished.  HENT:  Head: Normocephalic.  Eyes: Pupils are equal, round, and reactive to light.  Neck: Normal range of motion. Neck supple.  Cardiovascular: Normal rate.   Respiratory: Effort normal.  GI: Soft. There is tenderness. There is no rebound and no  guarding.  Genitourinary:       Mod amount of bright red blood in vault; cervix closed; no tissue or clots  Musculoskeletal: Normal range of motion.  Neurological: She is alert and oriented to person, place, and time.  Skin: Skin is warm and dry.  Psychiatric: She has a normal mood and affect.    Study Result     *RADIOLOGY REPORT*  Clinical Data: Vaginal bleeding. Decreasing beta HCG.  TRANSVAGINAL OB ULTRASOUND  Technique: Transvaginal ultrasound was performed for evaluation of  the gestation as well as the maternal uterus and adnexal regions.  Comparison: 01/03/2012.  Findings: Tiny intrauterine cystic structure is again present. On  today's examination, this measures 5 mm x 3 mm x 6 mm. Mean sac  diameter is 4.4 mm.  There is no yolk sac, embryo or cardiac activity.  There is a small subchorionic hemorrhage present. Compared to the  prior images, the location of the suspected gestational sac is now  in the lower uterine segment, likely representing abortion in  progress, particularly in the setting of decreasing quantitative  beta HCG.  Ovaries appear within normal limits with corpus luteum cyst on the  right.  IMPRESSION:  Constellation of findings highly suggestive of abortion in  progress/failed early pregnancy. Migration of previously seen  gestational sac to the lower uterine segment.  Original Report Authenticated By: Andreas Newport, M.D.   Results for orders placed during the hospital  encounter of 01/06/12 (from the past 24 hour(s))  CBC     Status: Normal   Collection Time   01/06/12  7:01 AM      Component Value Range   WBC 8.6  4.0 - 10.5 K/uL   RBC 4.59  3.87 - 5.11 MIL/uL   Hemoglobin 13.7  12.0 - 15.0 g/dL   HCT 16.1  09.6 - 04.5 %   MCV 88.5  78.0 - 100.0 fL   MCH 29.8  26.0 - 34.0 pg   MCHC 33.7  30.0 - 36.0 g/dL   RDW 40.9  81.1 - 91.4 %   Platelets 210  150 - 400 K/uL  HCG, QUANTITATIVE, PREGNANCY     Status: Abnormal   Collection Time   01/06/12   7:10 AM      Component Value Range   hCG, Beta Chain, Quant, S 1573 (*) <5 mIU/mL   Bhcg on 6/11 was 1975 MAU Course  Procedures CBC HCG [place] Care turned over to [facility], NP Pamelia Hoit, NP Assessment and Plan  01/06/2012, 7:04 AM  SAB in progress  Discussed with Dr. Macon Large and will do expectant management and have patient follow up in the GYN Clinic in 2 to 4 weeks. Patient will return here as needed.

## 2012-01-08 ENCOUNTER — Encounter: Payer: Self-pay | Admitting: *Deleted

## 2012-01-15 ENCOUNTER — Telehealth: Payer: Self-pay | Admitting: Advanced Practice Midwife

## 2012-01-17 NOTE — Telephone Encounter (Signed)
See telephone note.

## 2012-01-31 ENCOUNTER — Encounter: Payer: Medicaid Other | Admitting: Obstetrics & Gynecology

## 2012-05-22 ENCOUNTER — Encounter (HOSPITAL_COMMUNITY): Payer: Self-pay

## 2012-05-22 ENCOUNTER — Inpatient Hospital Stay (HOSPITAL_COMMUNITY): Payer: Medicaid Other

## 2012-05-22 ENCOUNTER — Inpatient Hospital Stay (HOSPITAL_COMMUNITY)
Admission: AD | Admit: 2012-05-22 | Discharge: 2012-05-22 | Disposition: A | Discharge status: Home / Self Care | Payer: Medicaid Other | Source: Ambulatory Visit | Attending: Obstetrics | Admitting: Obstetrics

## 2012-05-22 DIAGNOSIS — O26899 Other specified pregnancy related conditions, unspecified trimester: Secondary | ICD-10-CM

## 2012-05-22 DIAGNOSIS — R1031 Right lower quadrant pain: Secondary | ICD-10-CM | POA: Insufficient documentation

## 2012-05-22 DIAGNOSIS — O99891 Other specified diseases and conditions complicating pregnancy: Secondary | ICD-10-CM | POA: Insufficient documentation

## 2012-05-22 DIAGNOSIS — R109 Unspecified abdominal pain: Secondary | ICD-10-CM

## 2012-05-22 LAB — URINALYSIS, ROUTINE W REFLEX MICROSCOPIC
Bilirubin Urine: NEGATIVE
Ketones, ur: NEGATIVE mg/dL
Leukocytes, UA: NEGATIVE
Nitrite: NEGATIVE
Urobilinogen, UA: 0.2 mg/dL (ref 0.0–1.0)
pH: 5.5 (ref 5.0–8.0)

## 2012-05-22 LAB — CBC
HCT: 38.5 % (ref 36.0–46.0)
Hemoglobin: 13.2 g/dL (ref 12.0–15.0)
MCHC: 34.3 g/dL (ref 30.0–36.0)
MCV: 87.9 fL (ref 78.0–100.0)
WBC: 8 10*3/uL (ref 4.0–10.5)

## 2012-05-22 LAB — WET PREP, GENITAL
Clue Cells Wet Prep HPF POC: NONE SEEN
Yeast Wet Prep HPF POC: NONE SEEN

## 2012-05-22 LAB — HCG, QUANTITATIVE, PREGNANCY: hCG, Beta Chain, Quant, S: 280 m[IU]/mL — ABNORMAL HIGH (ref <5)

## 2012-05-22 NOTE — MAU Note (Signed)
Patient states she has has positive pregnancy tests at home. Started having right lower abdominal pain last night and continues this am. Denies any bleeding, discharge or vomiting.

## 2012-05-22 NOTE — MAU Provider Note (Signed)
History     CSN: [id number]  Arrival date and time: 05/22/12 1156   First Provider Initiated Contact with Patient 05/22/12 1244      Chief Complaint  Patient presents with  . Possible Pregnancy  . Abdominal Pain   HPI Courtney Strong 21 y.o. [redacted]w[redacted]d Began having RLQ pain today.  Positive home pregnancy test this week.  Previous history of miscarriage.  Worried about this pregnancy.  Has not yet started prenatal care.  OB History    Grav Para Term Preterm Abortions TAB SAB Ect Mult Living   4 1   1 1    1       Past Medical History  Diagnosis Date  . No pertinent past medical history     Past Surgical History  Procedure Date  . Cesarean section     Family History  Problem Relation Age of Onset  . Other Neg Hx     History  Substance Use Topics  . Smoking status: Current Some Day Smoker -- 1.0 packs/day for 10 years    Types: Cigarettes    Last Attempt to Quit: 05/03/2011  . Smokeless tobacco: Not on file  . Alcohol Use: Yes     social drinker, has been drinking in September - prior to knowing about pregnancy    Allergies: No Known Allergies  Prescriptions prior to admission  Medication Sig Dispense Refill  . loperamide (IMODIUM) 2 MG capsule Take 2 mg by mouth 4 (four) times daily as needed.        Review of Systems  Constitutional: Negative for fever.  Gastrointestinal: Positive for abdominal pain. Negative for nausea, vomiting, diarrhea and constipation.  Genitourinary:       No vaginal discharge. No vaginal bleeding. No dysuria.   Physical Exam   Blood pressure 126/65, pulse 79, temperature 98 F (36.7 C), temperature source Oral, resp. rate 16, height 5\' 4"  (1.626 m), weight 67.767 kg (149 lb 6.4 oz), last menstrual period 04/13/2012, SpO2 100.00%, unknown if currently breastfeeding.  Physical Exam  Nursing note and vitals reviewed. Constitutional: She is oriented to person, place, and time. She appears well-developed and well-nourished.    HENT:  Head: Normocephalic.  Eyes: EOM are normal.  Neck: Neck supple.  GI: Soft. There is tenderness. There is no rebound and no guarding.  Genitourinary:       Speculum exam: Vagina - Small amount of creamy discharge, no odor Cervix - No contact bleeding Bimanual exam: Cervix closed Uterus mildly tender, normal size Adnexa non tender, no masses bilaterally GC/Chlam, wet prep done Chaperone present for exam.  Musculoskeletal: Normal range of motion.  Neurological: She is alert and oriented to person, place, and time.  Skin: Skin is warm and dry.  Psychiatric: She has a normal mood and affect.    MAU Course  Procedures Results for orders placed during the hospital encounter of 05/22/12 (from the past 24 hour(s))  URINALYSIS, ROUTINE W REFLEX MICROSCOPIC     Status: Normal   Collection Time   05/22/12 12:15 PM      Component Value Range   Color, Urine YELLOW  YELLOW   APPearance CLEAR  CLEAR   Specific Gravity, Urine 1.020  1.005 - 1.030   pH 5.5  5.0 - 8.0   Glucose, UA NEGATIVE  NEGATIVE mg/dL   Hgb urine dipstick NEGATIVE  NEGATIVE   Bilirubin Urine NEGATIVE  NEGATIVE   Ketones, ur NEGATIVE  NEGATIVE mg/dL   Protein, ur NEGATIVE  NEGATIVE mg/dL  Urobilinogen, UA 0.2  0.0 - 1.0 mg/dL   Nitrite NEGATIVE  NEGATIVE   Leukocytes, UA NEGATIVE  NEGATIVE  POCT PREGNANCY, URINE     Status: Abnormal   Collection Time   05/22/12 12:32 PM      Component Value Range   Preg Test, Ur POSITIVE (*) NEGATIVE  HCG, QUANTITATIVE, PREGNANCY     Status: Abnormal   Collection Time   05/22/12  1:00 PM      Component Value Range   hCG, Beta Chain, Quant, S 280 (*) <5 mIU/mL  CBC     Status: Normal   Collection Time   05/22/12  1:00 PM      Component Value Range   WBC 8.0  4.0 - 10.5 K/uL   RBC 4.38  3.87 - 5.11 MIL/uL   Hemoglobin 13.2  12.0 - 15.0 g/dL   HCT 14.7  82.9 - 56.2 %   MCV 87.9  78.0 - 100.0 fL   MCH 30.1  26.0 - 34.0 pg   MCHC 34.3  30.0 - 36.0 g/dL   RDW 13.0   86.5 - 78.4 %   Platelets 214  150 - 400 K/uL    MDM   Unable to stay for results of wet prep.  Needs to meet children after school.  Assessment and Plan  Early pregnancy, no documented IUP  Plan Return on Friday morning for repeat quant. Return sooner for severe pain or bleeding. Ectopic precautions.   Haru Shaff 05/22/2012, 12:50 PM

## 2012-05-23 LAB — GC/CHLAMYDIA PROBE AMP, GENITAL: Chlamydia, DNA Probe: NEGATIVE

## 2012-05-24 ENCOUNTER — Inpatient Hospital Stay (HOSPITAL_COMMUNITY): Payer: Medicaid Other

## 2012-05-24 ENCOUNTER — Other Ambulatory Visit: Payer: Self-pay | Admitting: Advanced Practice Midwife

## 2012-05-24 ENCOUNTER — Inpatient Hospital Stay (HOSPITAL_COMMUNITY)
Admission: AD | Admit: 2012-05-24 | Discharge: 2012-05-24 | Disposition: A | Discharge status: Home / Self Care | Payer: Medicaid Other | Source: Ambulatory Visit | Attending: Obstetrics & Gynecology | Admitting: Obstetrics & Gynecology

## 2012-05-24 ENCOUNTER — Encounter (HOSPITAL_COMMUNITY): Payer: Self-pay

## 2012-05-24 DIAGNOSIS — Z3201 Encounter for pregnancy test, result positive: Secondary | ICD-10-CM

## 2012-05-24 DIAGNOSIS — O99891 Other specified diseases and conditions complicating pregnancy: Secondary | ICD-10-CM | POA: Insufficient documentation

## 2012-05-24 DIAGNOSIS — R109 Unspecified abdominal pain: Secondary | ICD-10-CM | POA: Insufficient documentation

## 2012-05-24 DIAGNOSIS — O26899 Other specified pregnancy related conditions, unspecified trimester: Secondary | ICD-10-CM

## 2012-05-24 NOTE — MAU Provider Note (Signed)
Will get [place] in 7-10 days.

## 2012-05-24 NOTE — MAU Note (Signed)
Pt her for repeat BHCG, pain is on r lower pelvic area, is better than last MAU visit. Pt states pain is worse after voiding. Denies abnormal vaginal discharge or bleeding.

## 2012-05-24 NOTE — Addendum Note (Signed)
Addended by: Sharen Counter A on: 05/24/2012 03:22 PM   Modules accepted: Orders

## 2012-05-24 NOTE — MAU Provider Note (Signed)
S: 21 y.o. [id number] @[redacted]w[redacted]d  presents to MAU for f/u quantitative HCG.  She had quant drawn on 10/30 with results of 260.  [place] on that date showed possible gestational sac only.  She denies abdominal pain, vaginal bleeding, vaginal itching/burning, urinary symptoms, h/a, dizziness, n/v, or fever/chills today.  O: BP 117/74  Pulse 103  Temp 97.1 F (36.2 C) (Oral)  Resp 16  Ht 5\' 4"  (1.626 m)  Wt 67.586 kg (149 lb)  BMI 25.58 kg/m2  LMP 04/13/2012  Breastfeeding? No  Results for orders placed during the hospital encounter of 05/24/12 (from the past 24 hour(s))  HCG, QUANTITATIVE, PREGNANCY     Status: Abnormal   Collection Time   05/24/12 10:33 AM      Component Value Range   hCG, Beta Chain, Quant, S 907 (*) <5 mIU/mL    A: Positive pregnancy test Appropriate rise in quantitative hcg in 48 hours  P: D/C home Return to MAU for repeat U/S 14 days from previous U/S, on 06/05/12 Ectopic precautions given Return to MAU as needed  Sharen Counter Certified Nurse-Midwife

## 2012-05-29 ENCOUNTER — Encounter (HOSPITAL_COMMUNITY): Payer: Self-pay | Admitting: *Deleted

## 2012-05-29 ENCOUNTER — Inpatient Hospital Stay (HOSPITAL_COMMUNITY)
Admission: AD | Admit: 2012-05-29 | Discharge: 2012-05-29 | Disposition: A | Discharge status: Home / Self Care | Payer: Medicaid Other | Source: Ambulatory Visit | Attending: Obstetrics & Gynecology | Admitting: Obstetrics & Gynecology

## 2012-05-29 ENCOUNTER — Ambulatory Visit (HOSPITAL_COMMUNITY)
Admission: RE | Admit: 2012-05-29 | Discharge: 2012-05-29 | Disposition: A | Discharge status: Home / Self Care | Payer: Medicaid Other | Source: Ambulatory Visit | Attending: Advanced Practice Midwife | Admitting: Advanced Practice Midwife

## 2012-05-29 DIAGNOSIS — Z1389 Encounter for screening for other disorder: Secondary | ICD-10-CM

## 2012-05-29 DIAGNOSIS — O99891 Other specified diseases and conditions complicating pregnancy: Secondary | ICD-10-CM | POA: Insufficient documentation

## 2012-05-29 DIAGNOSIS — R109 Unspecified abdominal pain: Secondary | ICD-10-CM

## 2012-05-29 DIAGNOSIS — Z3689 Encounter for other specified antenatal screening: Secondary | ICD-10-CM | POA: Insufficient documentation

## 2012-05-29 DIAGNOSIS — O3680X0 Pregnancy with inconclusive fetal viability, not applicable or unspecified: Secondary | ICD-10-CM | POA: Insufficient documentation

## 2012-05-29 DIAGNOSIS — Z3201 Encounter for pregnancy test, result positive: Secondary | ICD-10-CM

## 2012-05-29 DIAGNOSIS — Z3490 Encounter for supervision of normal pregnancy, unspecified, unspecified trimester: Secondary | ICD-10-CM

## 2012-05-29 NOTE — MAU Provider Note (Signed)
  History     CSN: [id number]  Arrival date and time: 05/29/12 1040   First Provider Initiated Contact with Patient 05/29/12 1112      Chief Complaint  Patient presents with  . Follow-up   HPI  pt is here for FU ultrasound to verify IUP. She denies any pain or bleeding.   Past Medical History  Diagnosis Date  . No pertinent past medical history     Past Surgical History  Procedure Date  . Cesarean section     Family History  Problem Relation Age of Onset  . Other Neg Hx   . Hearing loss Neg Hx     History  Substance Use Topics  . Smoking status: Former Smoker -- 1.0 packs/day for 10 years    Types: Cigarettes  . Smokeless tobacco: Never Used     Comment: with + preg  . Alcohol Use: Yes     Comment: social drinker, has been drinking in September - prior to knowing about pregnancy    Allergies: No Known Allergies  No prescriptions prior to admission    ROS Physical Exam   Blood pressure 109/61, pulse 86, temperature 97.9 F (36.6 C), temperature source Oral, resp. rate 18, last menstrual period 04/13/2012.  Physical Exam  Nursing note and vitals reviewed. Constitutional: She appears well-developed and well-nourished. No distress.  Psychiatric: She has a normal mood and affect.    MAU Course  Procedures  Ultrasound reveals IUP.Too early to see fetal pole or cardiac activity.   Assessment and Plan  Early IUP Pt declines repeat ultrasound. States "I live too far away to come back again." Given appropriate rise in HCG, and interval growth on ultrasound OK to not repeat ultrasound 1st trimester danger signs reviewed Knows to return to MAU with any concerns Plans to start prenatal care with Dr. Gaynell Face.   Tawnya Crook 05/29/2012, 11:13 AM

## 2012-05-29 NOTE — MAU Note (Signed)
Had received call from Misty Stanley to come back today, wanting to make sure it wasn't an ectopic. Pt states is feeling much better,  Explained to pt is still to early to see anything on [place].

## 2012-06-28 ENCOUNTER — Inpatient Hospital Stay (HOSPITAL_COMMUNITY)
Admission: AD | Admit: 2012-06-28 | Discharge: 2012-06-28 | Disposition: A | Discharge status: Home / Self Care | Payer: Medicaid Other | Source: Ambulatory Visit | Attending: Obstetrics & Gynecology | Admitting: Obstetrics & Gynecology

## 2012-06-28 ENCOUNTER — Encounter (HOSPITAL_COMMUNITY): Payer: Self-pay | Admitting: Family

## 2012-06-28 DIAGNOSIS — G43909 Migraine, unspecified, not intractable, without status migrainosus: Secondary | ICD-10-CM | POA: Insufficient documentation

## 2012-06-28 DIAGNOSIS — O211 Hyperemesis gravidarum with metabolic disturbance: Secondary | ICD-10-CM | POA: Insufficient documentation

## 2012-06-28 DIAGNOSIS — O99891 Other specified diseases and conditions complicating pregnancy: Secondary | ICD-10-CM | POA: Insufficient documentation

## 2012-06-28 DIAGNOSIS — R42 Dizziness and giddiness: Secondary | ICD-10-CM | POA: Insufficient documentation

## 2012-06-28 DIAGNOSIS — E860 Dehydration: Secondary | ICD-10-CM | POA: Insufficient documentation

## 2012-06-28 DIAGNOSIS — O219 Vomiting of pregnancy, unspecified: Secondary | ICD-10-CM

## 2012-06-28 HISTORY — DX: Other specified bacterial agents as the cause of diseases classified elsewhere: B96.89

## 2012-06-28 HISTORY — DX: Acute vaginitis: N76.0

## 2012-06-28 LAB — URINALYSIS, ROUTINE W REFLEX MICROSCOPIC
Bilirubin Urine: NEGATIVE
Hgb urine dipstick: NEGATIVE
Protein, ur: NEGATIVE mg/dL
Urobilinogen, UA: 0.2 mg/dL (ref 0.0–1.0)

## 2012-06-28 MED ORDER — PROMETHAZINE HCL 25 MG PO TABS
12.5000 mg | ORAL_TABLET | Freq: Four times a day (QID) | ORAL | Status: DC | PRN
Start: 2012-06-28 — End: 2013-01-07

## 2012-06-28 MED ORDER — DEXAMETHASONE SODIUM PHOSPHATE 10 MG/ML IJ SOLN
10.0000 mg | INTRAMUSCULAR | Status: AC
  Administered 2012-06-28: 10 mg via INTRAVENOUS
  Filled 2012-06-28: qty 1

## 2012-06-28 MED ORDER — PROMETHAZINE HCL 25 MG PO TABS: 12.5000 mg | ORAL_TABLET | Freq: Four times a day (QID) | ORAL | Status: DC | PRN

## 2012-06-28 MED ORDER — LACTATED RINGERS IV BOLUS (SEPSIS)
1000.0000 mL | Freq: Once | INTRAVENOUS | Status: AC
  Administered 2012-06-28: 1000 mL via INTRAVENOUS

## 2012-06-28 MED ORDER — DIPHENHYDRAMINE HCL 50 MG/ML IJ SOLN
25.0000 mg | INTRAMUSCULAR | Status: AC
  Administered 2012-06-28: 25 mg via INTRAVENOUS
  Filled 2012-06-28: qty 1

## 2012-06-28 MED ORDER — METOCLOPRAMIDE HCL 5 MG/ML IJ SOLN
10.0000 mg | INTRAMUSCULAR | Status: AC
  Administered 2012-06-28: 10 mg via INTRAVENOUS
  Filled 2012-06-28: qty 2

## 2012-06-28 NOTE — MAU Provider Note (Signed)
History     CSN: [id number]  Arrival date and time: 06/28/12 3086   First Provider Initiated Contact with Patient 06/28/12 0935      Chief Complaint  Patient presents with  . Dizziness   HPI Comments: DWANDA TUFANO is a 21 y.o. G60P1021 female with an estimated gestational age of [redacted]w[redacted]d estimated by LMP 04/13/12 complaining of a migraine like headache, dizziness and suprapubic pressure. She woke up this morning at 5:30 am and ate breakfast, went back to sleep and woke back up around 8 am feeling dizzy, with blurry vision and a severe headache. Got so dizzy she was falling over, but was caught by husband, did not fall, no trauma. Headache is behind her eyes, throbbing around her temples and in the back of her head. Has not taken anything for her headache. Now reports feeling nauseas. No vomiting. She also reports a few day history of suprapubic pressure and feeling uncomfortable, worse when sitting up and lying on her back. Better when she is on her side in the fetal position. Denies vaginal or abdominal pain or vaginal bleeding.  No personal history of low blood pressure, anemia or migraines. Had a c-section with first child, but an uncomplicated pregnancy.     Past Medical History  Diagnosis Date  . No pertinent past medical history   . Bacterial vaginosis     Past Surgical History  Procedure Date  . Cesarean section     Family History  Problem Relation Age of Onset  . Other Neg Hx   . Hearing loss Neg Hx     History  Substance Use Topics  . Smoking status: Former Smoker -- 1.0 packs/day for 10 years    Types: Cigarettes  . Smokeless tobacco: Never Used     Comment: with + preg  . Alcohol Use: Yes     Comment: social drinker, has been drinking in September - prior to knowing about pregnancy    Allergies: No Known Allergies  No prescriptions prior to admission    Review of Systems  Constitutional: Negative for fever and chills.  HENT:       Temples  throbbing, pain behind eyes and in back of head   Eyes: Positive for blurred vision. Negative for double vision.       Positive for "seeing stars"  Gastrointestinal: Positive for nausea. Negative for heartburn, vomiting, abdominal pain, diarrhea and constipation.  Genitourinary: Negative for dysuria and hematuria.       Negative for pelvic pain, vaginal bleeding Suprapubic "pressure" and tender to palpation in that area  Neurological: Positive for dizziness, weakness and headaches.   Physical Exam   Blood pressure 105/50, pulse 78, temperature 97.6 F (36.4 C), temperature source Oral, resp. rate 16, height 5\' 4"  (1.626 m), weight 70.398 kg (155 lb 3.2 oz), last menstrual period 04/13/2012.  Physical Exam  Constitutional: She is oriented to person, place, and time. She appears well-developed and well-nourished. No distress.  GI: Soft. She exhibits no distension. There is tenderness (central suprapubic tenderness to light palpation) in the suprapubic area.    Musculoskeletal: Normal range of motion.  Neurological: She is alert and oriented to person, place, and time.  Skin: Skin is warm and dry.  Psychiatric: She has a normal mood and affect. Her behavior is normal.    Results for orders placed during the hospital encounter of 06/28/12 (from the past 24 hour(s))  URINALYSIS, ROUTINE W REFLEX MICROSCOPIC     Status: Abnormal  Collection Time   06/28/12  9:00 AM      Component Value Range   Color, Urine YELLOW  YELLOW   APPearance CLEAR  CLEAR   Specific Gravity, Urine >1.030 (*) 1.005 - 1.030   pH 5.5  5.0 - 8.0   Glucose, UA NEGATIVE  NEGATIVE mg/dL   Hgb urine dipstick NEGATIVE  NEGATIVE   Bilirubin Urine NEGATIVE  NEGATIVE   Ketones, ur NEGATIVE  NEGATIVE mg/dL   Protein, ur NEGATIVE  NEGATIVE mg/dL   Urobilinogen, UA 0.2  0.0 - 1.0 mg/dL   Nitrite NEGATIVE  NEGATIVE   Leukocytes, UA NEGATIVE  NEGATIVE    MAU Course  Procedures  Given IV fluid, benadryl, decadron,  reglan    Assessment and Plan  KERLY RIGSBEE is a [id number] 21 y.o. female with an estimated gestational age of [redacted]w[redacted]d by LMP 04/13/12: 1. Migraine/dehydration   Plan: LR Bolus  Benadryl 25 mg  Decadron 10 mg  Reglan 10 mg    Marnee Spring 06/28/2012, 9:47 AM   I have seen this patient and agree with the above PA student's note with the following additions. Pt reported complete relief of all symptoms following IV fluids and medication.   1. Episode of dizziness   2. Nausea/vomiting in pregnancy    Prescription for phenergan 12.5 mg PO Q6 hours sent to pharmacy.   Pt has initial appointment next week with Dr Gaynell Face.  Return to MAU as needed.  LEFTWICH-KIRBY, Efosa Treichler Certified Nurse-Midwife

## 2012-06-28 NOTE — MAU Provider Note (Signed)
Attestation of Attending Supervision of Advanced Practitioner (CNM/NP): Evaluation and management procedures were performed by the Advanced Practitioner under my supervision and collaboration.  I have reviewed the Advanced Practitioner's note and chart, and I agree with the management and plan.  UGONNA  ANYANWU, MD, FACOG Attending Obstetrician & Gynecologist Faculty Practice, [facility]  

## 2012-06-28 NOTE — MAU Note (Signed)
Pt reports awoke to eat and felt fine; went back to sleep and when she got up at 0800, started to feel dizzy and seeing bright stars. Now reports having migraine behind eyes and at temples.

## 2012-07-10 ENCOUNTER — Encounter (HOSPITAL_COMMUNITY): Payer: Self-pay

## 2012-07-24 NOTE — L&D Delivery Note (Signed)
Delivery Note At 6:08 PM a viable female was delivered via Vaginal, Spontaneous Delivery (Presentation: ; Occiput Anterior).  APGAR: 9, 9; weight - pending.  Placenta status: Intact, Spontaneous.  Cord: 3 vessels with the following complications: None.   Anesthesia: Epidural  Episiotomy: None Lacerations: 1st degree perineal; periurethral abrasion. Suture Repair: 3.0 vicryl rapide Est. Blood Loss (mL): 450 mL  Mom to postpartum.  Baby to nursery-stable.  [name] Other 01/19/2013, 6:25 PM

## 2012-07-24 NOTE — L&D Delivery Note (Signed)
I was present for delivery and agree with above resident note. Napoleon Form, MD

## 2012-08-19 ENCOUNTER — Encounter (HOSPITAL_COMMUNITY): Payer: Self-pay

## 2012-08-19 ENCOUNTER — Inpatient Hospital Stay (HOSPITAL_COMMUNITY)
Admission: AD | Admit: 2012-08-19 | Discharge: 2012-08-19 | Disposition: A | Discharge status: Home / Self Care | Payer: Medicaid Other | Source: Ambulatory Visit | Attending: Obstetrics | Admitting: Obstetrics

## 2012-08-19 DIAGNOSIS — O26859 Spotting complicating pregnancy, unspecified trimester: Secondary | ICD-10-CM | POA: Insufficient documentation

## 2012-08-19 DIAGNOSIS — O209 Hemorrhage in early pregnancy, unspecified: Secondary | ICD-10-CM

## 2012-08-19 DIAGNOSIS — R109 Unspecified abdominal pain: Secondary | ICD-10-CM | POA: Insufficient documentation

## 2012-08-19 DIAGNOSIS — N949 Unspecified condition associated with female genital organs and menstrual cycle: Secondary | ICD-10-CM

## 2012-08-19 LAB — URINALYSIS, ROUTINE W REFLEX MICROSCOPIC
Bilirubin Urine: NEGATIVE
Glucose, UA: NEGATIVE mg/dL
Hgb urine dipstick: NEGATIVE
Ketones, ur: NEGATIVE mg/dL
pH: 6.5 (ref 5.0–8.0)

## 2012-08-19 LAB — WET PREP, GENITAL

## 2012-08-19 NOTE — MAU Provider Note (Signed)
History     CSN: [id number]  Arrival date and time: 08/19/12 1323    Chief Complaint  Patient presents with  . Vaginal Discharge  . Abdominal Pain   HPI Ms. NICOYA FRIEL is a 22 y.o. [id number] at [redacted]w[redacted]d who presents to MAU today with complaint of vaginal spotting. The patient states that she had intercourse last night and afterwards noticed a small amount of blood when she wiped. She wore a pad to bed and did not have anymore bleeding. This morning she noticed some bleeding when she wiped again and came in for evaluation. The patient states that she has had some very mild lower abdominal pain that she rates at a 2/10 at its worst. It is not present right now. The patient denies abnormal discharge, contractions or fever. She has had N/V but this has been consistent throughout her pregnancy.   OB History    Grav Para Term Preterm Abortions TAB SAB Ect Mult Living   4 1 1  2 1 1   1       Past Medical History  Diagnosis Date  . No pertinent past medical history   . Bacterial vaginosis     Past Surgical History  Procedure Date  . Cesarean section     Family History  Problem Relation Age of Onset  . Other Neg Hx   . Hearing loss Neg Hx     History  Substance Use Topics  . Smoking status: Former Smoker -- 1.0 packs/day for 10 years    Types: Cigarettes  . Smokeless tobacco: Never Used     Comment: with + preg  . Alcohol Use: Yes     Comment: social drinker, has been drinking in September - prior to knowing about pregnancy    Allergies: No Known Allergies  Prescriptions prior to admission  Medication Sig Dispense Refill  . promethazine (PHENERGAN) 25 MG tablet Take 1 tablet (25 mg total) by mouth every 6 (six) hours as needed for nausea.  15 tablet  0  . promethazine (PHENERGAN) 25 MG tablet Take 0.5 tablets (12.5 mg total) by mouth every 6 (six) hours as needed for nausea.  30 tablet  2    ROS All negative unless otherwise noted in HPI Physical Exam   Blood  pressure 112/72, pulse 83, temperature 97.8 F (36.6 C), temperature source Oral, resp. rate 16, height 5\' 4"  (1.626 m), weight 163 lb 9.6 oz (74.208 kg), last menstrual period 04/13/2012, SpO2 100.00%.  Physical Exam  Constitutional: She is oriented to person, place, and time. She appears well-developed and well-nourished.  HENT:  Head: Normocephalic.  Cardiovascular: Normal rate, regular rhythm and normal heart sounds.   Respiratory: Breath sounds normal. No respiratory distress.  GI: Soft. Bowel sounds are normal. She exhibits no distension and no mass. There is no tenderness. There is no rebound and no guarding.  Genitourinary: Vagina normal. Uterus is enlarged (appropriate for gestational age). Uterus is not tender. Cervix exhibits friability. Cervix exhibits no motion tenderness and no discharge (physiologic discharge, no blood noted on exam). Right adnexum displays no mass and no tenderness. Left adnexum displays no mass and no tenderness.  Neurological: She is alert and oriented to person, place, and time.  Skin: Skin is warm and dry. No erythema.  Psychiatric: She has a normal mood and affect.   FHT - 160 bpm  Results for orders placed during the hospital encounter of 08/19/12 (from the past 24 hour(s))  URINALYSIS, ROUTINE W REFLEX MICROSCOPIC  Status: Normal   Collection Time   08/19/12  1:45 PM      Component Value Range   Color, Urine YELLOW  YELLOW   APPearance CLEAR  CLEAR   Specific Gravity, Urine 1.025  1.005 - 1.030   pH 6.5  5.0 - 8.0   Glucose, UA NEGATIVE  NEGATIVE mg/dL   Hgb urine dipstick NEGATIVE  NEGATIVE   Bilirubin Urine NEGATIVE  NEGATIVE   Ketones, ur NEGATIVE  NEGATIVE mg/dL   Protein, ur NEGATIVE  NEGATIVE mg/dL   Urobilinogen, UA 0.2  0.0 - 1.0 mg/dL   Nitrite NEGATIVE  NEGATIVE   Leukocytes, UA NEGATIVE  NEGATIVE  WET PREP, GENITAL     Status: Abnormal   Collection Time   08/19/12  2:35 PM      Component Value Range   Yeast Wet Prep HPF POC  NONE SEEN  NONE SEEN   Trich, Wet Prep NONE SEEN  NONE SEEN   Clue Cells Wet Prep HPF POC NONE SEEN  NONE SEEN   WBC, Wet Prep HPF POC FEW (*) NONE SEEN    MAU Course  Procedures None  MDM Patient is most likely experiencing some mild round ligament pain and spotting secondary to cervical friability and intercourse.   Assessment and Plan  A: Round ligament pain Vaginal bleeding in pregnancy  P: Discharge home Bleeding precautions discussed Patient will keep follow-up appointment with Dr. Gaynell Face as scheduled Patient may return to MAU as needed or if condition should change or worsen  Freddi Starr, PA-C 08/19/2012, 2:16 PM

## 2012-08-19 NOTE — MAU Note (Signed)
Patient states she has slight spotting yesterday after intercourse. Had one spot today with slight cramping. Not wearing a pad.

## 2012-08-20 LAB — GC/CHLAMYDIA PROBE AMP
CT Probe RNA: POSITIVE — AB
GC Probe RNA: NEGATIVE

## 2012-10-02 ENCOUNTER — Other Ambulatory Visit (HOSPITAL_COMMUNITY): Payer: Self-pay | Admitting: Nurse Practitioner

## 2012-10-02 LAB — OB RESULTS CONSOLE RPR: RPR: NONREACTIVE

## 2012-10-02 LAB — HEMOGLOBIN: Hemoglobin: 12.5 g/dL (ref 12.0–16.0)

## 2012-10-02 LAB — OB RESULTS CONSOLE GC/CHLAMYDIA
Chlamydia: NEGATIVE
Gonorrhea: NEGATIVE

## 2012-10-02 LAB — OB RESULTS CONSOLE ANTIBODY SCREEN: Antibody Screen: POSITIVE

## 2012-10-02 LAB — OB RESULTS CONSOLE HEPATITIS B SURFACE ANTIGEN: Hepatitis B Surface Ag: NEGATIVE

## 2012-10-02 LAB — CULTURE, OB URINE: Urine Culture, OB: NO GROWTH

## 2012-10-03 ENCOUNTER — Ambulatory Visit (HOSPITAL_COMMUNITY)
Admission: RE | Admit: 2012-10-03 | Discharge: 2012-10-03 | Disposition: A | Discharge status: Home / Self Care | Payer: Medicaid Other | Source: Ambulatory Visit | Attending: Nurse Practitioner | Admitting: Nurse Practitioner

## 2012-10-03 DIAGNOSIS — O09299 Supervision of pregnancy with other poor reproductive or obstetric history, unspecified trimester: Secondary | ICD-10-CM | POA: Insufficient documentation

## 2012-10-03 DIAGNOSIS — Z363 Encounter for antenatal screening for malformations: Secondary | ICD-10-CM | POA: Insufficient documentation

## 2012-10-03 DIAGNOSIS — O358XX0 Maternal care for other (suspected) fetal abnormality and damage, not applicable or unspecified: Secondary | ICD-10-CM | POA: Insufficient documentation

## 2012-10-03 DIAGNOSIS — O34219 Maternal care for unspecified type scar from previous cesarean delivery: Secondary | ICD-10-CM | POA: Insufficient documentation

## 2012-10-22 LAB — PRENATAL ANTIBODY IDENTIFICATION: Antibody Identification: NEGATIVE

## 2012-11-18 ENCOUNTER — Encounter: Payer: Self-pay | Admitting: *Deleted

## 2012-11-20 DIAGNOSIS — Z23 Encounter for immunization: Secondary | ICD-10-CM | POA: Insufficient documentation

## 2012-11-20 DIAGNOSIS — O0930 Supervision of pregnancy with insufficient antenatal care, unspecified trimester: Secondary | ICD-10-CM | POA: Insufficient documentation

## 2012-11-20 DIAGNOSIS — IMO0002 Reserved for concepts with insufficient information to code with codable children: Secondary | ICD-10-CM | POA: Insufficient documentation

## 2012-11-20 DIAGNOSIS — O34219 Maternal care for unspecified type scar from previous cesarean delivery: Secondary | ICD-10-CM | POA: Insufficient documentation

## 2012-11-20 DIAGNOSIS — O0932 Supervision of pregnancy with insufficient antenatal care, second trimester: Secondary | ICD-10-CM

## 2012-11-20 DIAGNOSIS — A568 Sexually transmitted chlamydial infection of other sites: Secondary | ICD-10-CM
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
## 2012-11-21 ENCOUNTER — Ambulatory Visit (INDEPENDENT_AMBULATORY_CARE_PROVIDER_SITE_OTHER): Payer: Medicaid Other | Admitting: Obstetrics & Gynecology

## 2012-11-21 ENCOUNTER — Encounter: Payer: Self-pay | Admitting: Obstetrics & Gynecology

## 2012-11-21 VITALS — BP 112/56 | Wt 173.8 lb

## 2012-11-21 DIAGNOSIS — O0932 Supervision of pregnancy with insufficient antenatal care, second trimester: Secondary | ICD-10-CM

## 2012-11-21 DIAGNOSIS — O0930 Supervision of pregnancy with insufficient antenatal care, unspecified trimester: Secondary | ICD-10-CM

## 2012-11-21 DIAGNOSIS — R894 Abnormal immunological findings in specimens from other organs, systems and tissues: Secondary | ICD-10-CM

## 2012-11-21 DIAGNOSIS — O34219 Maternal care for unspecified type scar from previous cesarean delivery: Secondary | ICD-10-CM

## 2012-11-21 DIAGNOSIS — R760 Raised antibody titer: Secondary | ICD-10-CM | POA: Insufficient documentation

## 2012-11-21 LAB — POCT URINALYSIS DIP (DEVICE)
Hgb urine dipstick: NEGATIVE
Nitrite: NEGATIVE
Protein, ur: NEGATIVE mg/dL
Specific Gravity, Urine: 1.025 (ref 1.005–1.030)
Urobilinogen, UA: 0.2 mg/dL (ref 0.0–1.0)
pH: 6 (ref 5.0–8.0)

## 2012-11-21 NOTE — Patient Instructions (Signed)
Vaginal Birth After Cesarean Delivery  Vaginal birth after Cesarean delivery (VBAC) is giving birth vaginally after previously delivering a baby by a cesarean. In the past, if a woman had a Cesarean delivery, all births afterwards would be done by Cesarean delivery. This is no longer true. It can be safe for the mother to try a vaginal delivery after having a Cesarean. The final decision to have a VBAC or repeat Cesarean delivery should be between the patient and her caregiver. The risks and benefits can be discussed relative to the reason for, and the type of the previous Cesarean delivery.  WOMEN WHO PLAN TO HAVE A VBAC SHOULD CHECK WITH THEIR DOCTOR TO BE SURE THAT:  · The previous Cesarean was done with a low transverse uterine incision (not a vertical classical incision).  · The birth canal is big enough for the baby.  · There were no other operations on the uterus.  · They will have an electronic fetal monitor (EFM) on at all times during labor.  · An operating room would be available and ready in case an emergency Cesarean is needed.  · A doctor and surgical nursing staff would be available at all times during labor to be ready to do an emergency Cesarean if necessary.  · An anesthesiologist would be present in case an emergency Cesarean is needed.  · The nursery is prepared and has adequate personnel and necessary equipment available to care for the baby in case of an emergency Cesarean.  BENEFITS OF VBAC:  · Shorter stay in the hospital.  · Lower delivery, nursery and hospital costs.  · Less blood loss and need for blood transfusions.  · Less fever and discomfort from major surgery.  · Lower risk of blood clots.  · Lower risk of infection.  · Shorter recovery after going home.  · Lower risk of other surgical complications, such as opening of the incision or hernia in the incision.  · Decreased risk of injury to other organs.  · Decreased risk for having to remove the uterus (hysterectomy).  · Decreased risk  for the placenta to completely or partially cover the opening of the uterus (placenta previa) with a future pregnancy.  · Ability to have a larger family if desired.  RISKS OF A VBAC:  · Rupture of the uterus.  · Having to remove the uterus (hysterectomy) if it ruptures.  · All the complications of major surgery and/or injury to other organs.  · Excessive bleeding, blood clots and infection.  · Lower Apgar scores (method to evaluate the newborn based on appearance, pulse, grimace, activity, and respiration) and more risks to the baby.  · There is a higher risk of uterine rupture if you induce or augment labor.  · There is a higher risk of uterine rupture if you use medications to ripen the cervix.  VBAC SHOULD NOT BE DONE IF:  · The previous Cesarean was done with a vertical (classical) or T-shaped incision, or you do not know what kind of an incision was made.  · You had a ruptured uterus.  · You had surgery on your uterus.  · You have medical or obstetrical problems.  · There are problems with the baby.  · There were two previous Cesarean deliveries and no vaginal deliveries.  OTHER FACTS TO KNOW ABOUT VBAC:  · It is safe to have an epidural anesthetic with VBAC.  · It is safe to turn the baby from a breech position (attempt an external   cephalic version).  · It is safe to try a VBAC with twins.  · Pregnancies later than 40 weeks have not been successful with VBAC.  · There is an increased failure rate of a VBAC in obese pregnant women.  · There is an increased failure rate with VABC if the baby weighs 8.8 pounds (4000 grams) or more.  · There is an increased failure rate if the time between the Cesarean and VBAC is less than 19 months.  · There is an increased failure rate if pre-eclampsia is present (high blood pressure, protein in the urine and swelling of face and extremities).  · VBAC is very successful if there was a previous vaginal birth.  · VBAC is very successful when the labor starts spontaneously before  the due date.  · Delivery of VBAC is similar to having a normal spontaneous vaginal delivery.  It is important to discuss VBAC with your caregiver early in the pregnancy so you can understand the risks, benefits and options. It will give you time to decide what is best in your particular case relevant to the reason for your previous Cesarean delivery. It should be understood that medical changes in the mother or pregnancy may occur during the pregnancy, which make it necessary to change you or your caregiver's initial decision. The counseling, concerns and decisions should be documented in the medical record and signed by all parties.  Document Released: 12/31/2006 Document Revised: 10/02/2011 Document Reviewed: 08/21/2008  Exit[employer]® Patient Information ©2013 [employer].

## 2012-11-21 NOTE — Progress Notes (Signed)
Nutrition note: 1st visit consult Pt has gained 33.8# @ [redacted]w[redacted]d, which is > expected. Pt reports eating 3 meals & 3-5 snacks/d.  Pt is taking PNV.  Pt reports no N&V but has some heartburn. NKFA. Pt received verbal & written education on general nutrition during pregnancy. Disc tips to decrease heartburn. Encouraged healthy snacks. Disc wt gain goals of 25-35# or 1#/wk. Pt agrees to cont taking PNV. Pt has WIC & plans to BF. F/u if referred Blondell Reveal, MS, RD, LDN

## 2012-11-21 NOTE — Progress Notes (Signed)
Transferred from [facility] with anti c antibody on 4/2. EDC 7/7 by early [place], considering repeat CS and BTL, but is too young for BTL. Counseled re VBAC  Or RCS, will sign consent next visit. Repeat titer today

## 2012-11-21 NOTE — Progress Notes (Signed)
P=88, Here for initial ob.  C/o trace edema in feet, pelvic pressure, c/o cramping in her sides when she walks a lot, Given new patient information and discussed appropriate weight gain- will see nutritionist today. Declines TDAp.

## 2012-11-22 NOTE — Progress Notes (Deleted)
Transferred

## 2012-11-22 NOTE — Progress Notes (Signed)
transferrred from [facility] with positive antibody screen, anti c, titer 4. No h/o transfusion. Prenatal record reviewed. Will check titers today and if continued low then [place], MCA dopplers not needed.  Explained to patient.

## 2012-11-22 NOTE — Addendum Note (Signed)
Addended by: Adam Phenix on: 11/22/2012 02:57 PM   Modules accepted: Level of Service

## 2012-11-25 ENCOUNTER — Encounter: Payer: Self-pay | Admitting: *Deleted

## 2012-12-05 ENCOUNTER — Ambulatory Visit (INDEPENDENT_AMBULATORY_CARE_PROVIDER_SITE_OTHER): Payer: Medicaid Other | Admitting: Family Medicine

## 2012-12-05 VITALS — BP 122/74 | Temp 96.9°F | Wt 174.6 lb

## 2012-12-05 DIAGNOSIS — O0933 Supervision of pregnancy with insufficient antenatal care, third trimester: Secondary | ICD-10-CM

## 2012-12-05 DIAGNOSIS — O34219 Maternal care for unspecified type scar from previous cesarean delivery: Secondary | ICD-10-CM

## 2012-12-05 DIAGNOSIS — O0930 Supervision of pregnancy with insufficient antenatal care, unspecified trimester: Secondary | ICD-10-CM

## 2012-12-05 LAB — POCT URINALYSIS DIP (DEVICE)
Bilirubin Urine: NEGATIVE
Glucose, UA: NEGATIVE mg/dL
Hgb urine dipstick: NEGATIVE
Ketones, ur: NEGATIVE mg/dL
pH: 6 (ref 5.0–8.0)

## 2012-12-05 NOTE — Progress Notes (Signed)
Ok for [employer]. Had some contractions

## 2012-12-05 NOTE — Progress Notes (Signed)
Patient reports painful contractions.

## 2012-12-12 ENCOUNTER — Inpatient Hospital Stay (HOSPITAL_COMMUNITY)
Admission: AD | Admit: 2012-12-12 | Discharge: 2012-12-12 | Disposition: A | Discharge status: Home / Self Care | Payer: Medicaid Other | Source: Ambulatory Visit | Attending: Obstetrics & Gynecology | Admitting: Obstetrics & Gynecology

## 2012-12-12 ENCOUNTER — Telehealth: Payer: Self-pay | Admitting: *Deleted

## 2012-12-12 ENCOUNTER — Encounter (HOSPITAL_COMMUNITY): Payer: Self-pay

## 2012-12-12 DIAGNOSIS — O2390 Unspecified genitourinary tract infection in pregnancy, unspecified trimester: Secondary | ICD-10-CM | POA: Insufficient documentation

## 2012-12-12 DIAGNOSIS — O99891 Other specified diseases and conditions complicating pregnancy: Secondary | ICD-10-CM | POA: Insufficient documentation

## 2012-12-12 DIAGNOSIS — B9689 Other specified bacterial agents as the cause of diseases classified elsewhere: Secondary | ICD-10-CM | POA: Insufficient documentation

## 2012-12-12 DIAGNOSIS — A499 Bacterial infection, unspecified: Secondary | ICD-10-CM

## 2012-12-12 DIAGNOSIS — N760 Acute vaginitis: Secondary | ICD-10-CM

## 2012-12-12 DIAGNOSIS — O4700 False labor before 37 completed weeks of gestation, unspecified trimester: Secondary | ICD-10-CM | POA: Insufficient documentation

## 2012-12-12 LAB — URINALYSIS, ROUTINE W REFLEX MICROSCOPIC
Bilirubin Urine: NEGATIVE
Hgb urine dipstick: NEGATIVE
Nitrite: POSITIVE — AB
Specific Gravity, Urine: 1.01 (ref 1.005–1.030)
Urobilinogen, UA: 0.2 mg/dL (ref 0.0–1.0)
pH: 6 (ref 5.0–8.0)

## 2012-12-12 LAB — URINE MICROSCOPIC-ADD ON

## 2012-12-12 MED ORDER — METRONIDAZOLE 500 MG PO TABS: 500.0000 mg | ORAL_TABLET | Freq: Once | ORAL | Status: DC

## 2012-12-12 MED ORDER — METRONIDAZOLE 500 MG PO TABS: 500.0000 mg | ORAL_TABLET | Freq: Two times a day (BID) | ORAL | Status: DC

## 2012-12-12 NOTE — MAU Provider Note (Signed)
Attestation of Attending Supervision of Advanced Practitioner (PA/CNM/NP): Evaluation and management procedures were performed by the Advanced Practitioner under my supervision and collaboration.  I have reviewed the Advanced Practitioner's note and chart, and I agree with the management and plan.  Moneka Mcquinn, MD, FACOG Attending Obstetrician & Gynecologist Faculty Practice, [facility]  

## 2012-12-12 NOTE — Telephone Encounter (Signed)
Patient called and stated that she is having fluid leaking for the past few days. She said when she sat down and got back up there was a watery place on her bed. I advised her to go to MAU to be evaluated.

## 2012-12-12 NOTE — MAU Provider Note (Signed)
None     Chief Complaint:  Rupture of Membranes   Courtney Strong is  22 y.o. [id number] at [redacted]w[redacted]d presents complaining of Rupture of Membranes .  She states none contractions are associated with none vaginal bleeding.  Pt states that she has been having fluid leaking for the past few days.  She said that when she sat down and got back up there was a wet spot on the bed.  Obstetrical/Gynecological History: Menstrual History: OB History   Grav Para Term Preterm Abortions TAB SAB Ect Mult Living   4 1 1  2 1 1   1        Patient's last menstrual period was 04/12/2012.     Past Medical History: Past Medical History  Diagnosis Date  . No pertinent past medical history   . Bacterial vaginosis   . UTI (lower urinary tract infection)   . Chlamydia     x3 , treated   . Abnormal Pap smear 2007    ASC-US     Past Surgical History: Past Surgical History  Procedure Laterality Date  . Cesarean section      Family History: Family History  Problem Relation Age of Onset  . Other Neg Hx   . Hearing loss Neg Hx     Social History: History  Substance Use Topics  . Smoking status: Former Smoker -- 1.00 packs/day for 10 years    Types: Cigarettes  . Smokeless tobacco: Never Used     Comment: with + preg  . Alcohol Use: Yes     Comment: social drinker, has been drinking in September - prior to knowing about pregnancy    Allergies: No Known Allergies  Meds:  Prescriptions prior to admission  Medication Sig Dispense Refill  . acetaminophen (TYLENOL) 500 MG tablet Take 500 mg by mouth every 6 (six) hours as needed for pain.      Marland Kitchen PRENATAL VITAMINS PO Take 1 tablet by mouth daily.      . promethazine (PHENERGAN) 25 MG tablet Take 0.5 tablets (12.5 mg total) by mouth every 6 (six) hours as needed for nausea.  30 tablet  2    Review of Systems   Constitutional: Negative for fever and chills Eyes: Negative for visual disturbances Respiratory: Negative for shortness of  breath, dyspnea Cardiovascular: Negative for chest pain or palpitations  Gastrointestinal: Negative for vomiting, diarrhea and constipation Genitourinary: Negative for dysuria and urgency Musculoskeletal: Negative for back pain, joint pain, myalgias  Neurological: Negative for dizziness and headaches       Physical Exam  Height 5\' 5"  (1.651 m), weight 79.833 kg (176 lb), last menstrual period 04/12/2012. GENERAL: Well-developed, well-nourished female in no acute distress.  LUNGS: Clear to auscultation bilaterally.  HEART: Regular rate and rhythm. ABDOMEN: Soft, nontender, nondistended, gravid.  EXTREMITIES: Nontender, no edema, 2+ distal pulses. SSE:  No pooling; mucus discharge along with some thin white discharge with an amine odor.  Fern and valsalva negative.  Wet Prep:  Moderate clue cells, no WBC or Trich CERVICAL EXAM: Dilatation 0cm   Effacement 0%   Station -3   Presentation: cephalic FHT:  Baseline rate 140 bpm   Variability moderate  Accelerations present   Decelerations none Contractions: Every 0 mins   Labs: No results found for this or any previous visit (from the past 24 hour(s)). Imaging Studies:  No results found.  Assessment: Courtney Strong is  22 y.o. [phone] at [redacted]w[redacted]d presents with BV, no evidence of ROM.  Plan: Flagyl 500mg  PO BID X 7  CRESENZO-DISHMAN,Dazja Houchin 5/22/20147:32 PM

## 2012-12-12 NOTE — MAU Note (Signed)
Pt states she noticed some watery discharge and thinks her water may have broken. Denies vaginal bleeding. States good fetal movement.

## 2012-12-19 ENCOUNTER — Encounter: Payer: Medicaid Other | Admitting: Obstetrics & Gynecology

## 2012-12-22 ENCOUNTER — Inpatient Hospital Stay (HOSPITAL_COMMUNITY)
Admission: AD | Admit: 2012-12-22 | Discharge: 2012-12-23 | Disposition: A | Discharge status: Home / Self Care | Payer: Medicaid Other | Source: Ambulatory Visit | Attending: Obstetrics and Gynecology | Admitting: Obstetrics and Gynecology

## 2012-12-22 DIAGNOSIS — Z0371 Encounter for suspected problem with amniotic cavity and membrane ruled out: Secondary | ICD-10-CM

## 2012-12-22 DIAGNOSIS — O4290 Premature rupture of membranes, unspecified as to length of time between rupture and onset of labor, unspecified weeks of gestation: Secondary | ICD-10-CM | POA: Insufficient documentation

## 2012-12-23 ENCOUNTER — Encounter (HOSPITAL_COMMUNITY): Payer: Self-pay

## 2012-12-23 DIAGNOSIS — O4290 Premature rupture of membranes, unspecified as to length of time between rupture and onset of labor, unspecified weeks of gestation: Secondary | ICD-10-CM

## 2012-12-23 LAB — WET PREP, GENITAL: Yeast Wet Prep HPF POC: NONE SEEN

## 2012-12-23 NOTE — MAU Note (Signed)
Pt states at pool today and noticed a gush of fluid. Came home and noticed that it continued to leak out. Denies vaginal bleeding and UC. States good FM.

## 2012-12-23 NOTE — MAU Provider Note (Signed)
History     CSN: [id number]  Arrival date and time: 12/22/12 2349   None     Chief Complaint  Patient presents with  . Rupture of Membranes   HPI  Courtney Strong is a 22 y.o. [id number] at [redacted]w[redacted]d who presents today with ?LOF. She states that she was in the pool today and felt something warm, and has noticed an increased vaginal discahrge since then. She states that she has not needed to wear a pad or pantiliner. She denies vaginal bleeding. She denies any UCs.   Past Medical History  Diagnosis Date  . No pertinent past medical history   . Bacterial vaginosis   . UTI (lower urinary tract infection)   . Chlamydia     x3 , treated   . Abnormal Pap smear 2007    ASC-US     Past Surgical History  Procedure Laterality Date  . Cesarean section      Family History  Problem Relation Age of Onset  . Other Neg Hx   . Hearing loss Neg Hx     History  Substance Use Topics  . Smoking status: Former Smoker -- 1.00 packs/day for 10 years    Types: Cigarettes  . Smokeless tobacco: Never Used     Comment: with + preg  . Alcohol Use: Yes     Comment: social drinker, has been drinking in September - prior to knowing about pregnancy    Allergies: No Known Allergies  Prescriptions prior to admission  Medication Sig Dispense Refill  . acetaminophen (TYLENOL) 500 MG tablet Take 500 mg by mouth every 6 (six) hours as needed for pain.      . metroNIDAZOLE (FLAGYL) 500 MG tablet Take 1 tablet (500 mg total) by mouth every 12 (twelve) hours.  14 tablet  0  . naproxen sodium (ANAPROX) 220 MG tablet Take 220 mg by mouth 2 (two) times daily with a meal.      . PRENATAL VITAMINS PO Take 1 tablet by mouth daily.      . promethazine (PHENERGAN) 25 MG tablet Take 0.5 tablets (12.5 mg total) by mouth every 6 (six) hours as needed for nausea.  30 tablet  2    Review of Systems  Constitutional: Negative for fever.  Eyes: Negative for blurred vision.  Gastrointestinal: Negative for nausea,  vomiting, abdominal pain, diarrhea and constipation.  Genitourinary: Negative for dysuria, urgency and frequency.  Neurological: Negative for headaches.   Physical Exam   Blood pressure 110/54, pulse 97, temperature 97.8 F (36.6 C), temperature source Oral, resp. rate 18, height 5\' 5"  (1.651 m), weight 79.561 kg (175 lb 6.4 oz), last menstrual period 04/12/2012, SpO2 100.00%.  Physical Exam  Nursing note and vitals reviewed. Constitutional: She is oriented to person, place, and time. She appears well-developed and well-nourished. No distress.  Cardiovascular: Normal rate.   Respiratory: Effort normal.  GI: Soft. There is no tenderness.  Genitourinary:   External: no lesion, dry Vagina: no pooling, small amount of white discharge Cervix: no fluid seen with valsalva, closed/50/-3 Fern: negative Uterus: AGA  Neurological: She is alert and oriented to person, place, and time.  Skin: Skin is warm and dry.  Psychiatric: She has a normal mood and affect.   FHT: 140, moderate with 15x 15 accels, no decels Toco: No UCs  MAU Course  Procedures  Results for orders placed during the hospital encounter of 12/22/12 (from the past 24 hour(s))  WET PREP, GENITAL  Status: Abnormal   Collection Time    12/23/12 12:17 AM      Result Value Range   Yeast Wet Prep HPF POC NONE SEEN  NONE SEEN   Trich, Wet Prep NONE SEEN  NONE SEEN   Clue Cells Wet Prep HPF POC FEW (*) NONE SEEN   WBC, Wet Prep HPF POC FEW (*) NONE SEEN     Assessment and Plan   1. Encounter for suspected premature rupture of membranes, with rupture of membranes not found    Labor precautions FU as scheduled with the clinic  Tawnya Crook 12/23/2012, 12:18 AM

## 2012-12-26 ENCOUNTER — Ambulatory Visit (INDEPENDENT_AMBULATORY_CARE_PROVIDER_SITE_OTHER): Payer: Medicaid Other | Admitting: Obstetrics and Gynecology

## 2012-12-26 ENCOUNTER — Encounter: Payer: Self-pay | Admitting: Obstetrics and Gynecology

## 2012-12-26 VITALS — BP 95/66 | Wt 175.4 lb

## 2012-12-26 DIAGNOSIS — O0930 Supervision of pregnancy with insufficient antenatal care, unspecified trimester: Secondary | ICD-10-CM

## 2012-12-26 DIAGNOSIS — Z9149 Other personal history of psychological trauma, not elsewhere classified: Secondary | ICD-10-CM

## 2012-12-26 DIAGNOSIS — IMO0002 Reserved for concepts with insufficient information to code with codable children: Secondary | ICD-10-CM

## 2012-12-26 DIAGNOSIS — O34219 Maternal care for unspecified type scar from previous cesarean delivery: Secondary | ICD-10-CM

## 2012-12-26 DIAGNOSIS — Z23 Encounter for immunization: Secondary | ICD-10-CM

## 2012-12-26 LAB — POCT URINALYSIS DIP (DEVICE)
Glucose, UA: NEGATIVE mg/dL
Hgb urine dipstick: NEGATIVE
Ketones, ur: NEGATIVE mg/dL
Specific Gravity, Urine: 1.02 (ref 1.005–1.030)
Urobilinogen, UA: 0.2 mg/dL (ref 0.0–1.0)

## 2012-12-26 NOTE — Progress Notes (Signed)
Pulse: 109 Pt having lots of pelvic pain and pressure. Also has contractions. Feels like baby is low.

## 2012-12-26 NOTE — Progress Notes (Signed)
Patient doing well. Cultures collected today. FM/PTL precautions reviewed

## 2012-12-27 LAB — GC/CHLAMYDIA PROBE AMP
CT Probe RNA: NEGATIVE
GC Probe RNA: NEGATIVE

## 2012-12-29 LAB — CULTURE, BETA STREP (GROUP B ONLY)

## 2012-12-30 ENCOUNTER — Encounter: Payer: Self-pay | Admitting: Obstetrics and Gynecology

## 2012-12-31 NOTE — MAU Provider Note (Signed)
Attestation of Attending Supervision of Advanced Practitioner: Evaluation and management procedures were performed by the PA/NP/CNM/OB Fellow under my supervision/collaboration. Chart reviewed and agree with management and plan.  Kateri Balch V 12/31/2012 5:19 PM

## 2013-01-02 ENCOUNTER — Encounter: Payer: Self-pay | Admitting: Obstetrics & Gynecology

## 2013-01-02 ENCOUNTER — Ambulatory Visit (INDEPENDENT_AMBULATORY_CARE_PROVIDER_SITE_OTHER): Payer: Medicaid Other | Admitting: Obstetrics & Gynecology

## 2013-01-02 VITALS — BP 101/69 | Temp 97.3°F | Wt 175.6 lb

## 2013-01-02 DIAGNOSIS — O0930 Supervision of pregnancy with insufficient antenatal care, unspecified trimester: Secondary | ICD-10-CM

## 2013-01-02 DIAGNOSIS — O0933 Supervision of pregnancy with insufficient antenatal care, third trimester: Secondary | ICD-10-CM

## 2013-01-02 DIAGNOSIS — O34219 Maternal care for unspecified type scar from previous cesarean delivery: Secondary | ICD-10-CM

## 2013-01-02 LAB — POCT URINALYSIS DIP (DEVICE)
Bilirubin Urine: NEGATIVE
Glucose, UA: NEGATIVE mg/dL
Ketones, ur: NEGATIVE mg/dL
Nitrite: NEGATIVE

## 2013-01-02 NOTE — Patient Instructions (Signed)

## 2013-01-02 NOTE — Progress Notes (Signed)
Pulse-  108 Patient reports lower abdominal/pelvic pressure and some vaginal pain also reports contractions every day

## 2013-01-02 NOTE — Progress Notes (Signed)
Cervix no change. [place] confirms cephalic presentation

## 2013-01-07 ENCOUNTER — Encounter (HOSPITAL_COMMUNITY): Payer: Self-pay | Admitting: *Deleted

## 2013-01-07 ENCOUNTER — Inpatient Hospital Stay (HOSPITAL_COMMUNITY)
Admission: AD | Admit: 2013-01-07 | Discharge: 2013-01-08 | Disposition: A | Discharge status: Home / Self Care | Payer: Medicaid Other | Source: Ambulatory Visit | Attending: Family Medicine | Admitting: Family Medicine

## 2013-01-07 DIAGNOSIS — O34219 Maternal care for unspecified type scar from previous cesarean delivery: Secondary | ICD-10-CM

## 2013-01-07 DIAGNOSIS — O99891 Other specified diseases and conditions complicating pregnancy: Secondary | ICD-10-CM | POA: Insufficient documentation

## 2013-01-07 DIAGNOSIS — N949 Unspecified condition associated with female genital organs and menstrual cycle: Secondary | ICD-10-CM

## 2013-01-07 DIAGNOSIS — O0932 Supervision of pregnancy with insufficient antenatal care, second trimester: Secondary | ICD-10-CM

## 2013-01-07 DIAGNOSIS — R109 Unspecified abdominal pain: Secondary | ICD-10-CM | POA: Insufficient documentation

## 2013-01-07 DIAGNOSIS — A568 Sexually transmitted chlamydial infection of other sites: Secondary | ICD-10-CM

## 2013-01-07 DIAGNOSIS — Z23 Encounter for immunization: Secondary | ICD-10-CM

## 2013-01-07 DIAGNOSIS — IMO0002 Reserved for concepts with insufficient information to code with codable children: Secondary | ICD-10-CM

## 2013-01-07 NOTE — MAU Note (Signed)
Pt reports "period like cramps"  X 2 days. Denies dysuria.

## 2013-01-08 DIAGNOSIS — N949 Unspecified condition associated with female genital organs and menstrual cycle: Secondary | ICD-10-CM

## 2013-01-08 LAB — CBC
MCHC: 34.7 g/dL (ref 30.0–36.0)
Platelets: 171 10*3/uL (ref 150–400)
RDW: 14.3 % (ref 11.5–15.5)

## 2013-01-08 LAB — URINALYSIS, ROUTINE W REFLEX MICROSCOPIC
Ketones, ur: NEGATIVE mg/dL
Leukocytes, UA: NEGATIVE
Nitrite: NEGATIVE
Protein, ur: NEGATIVE mg/dL

## 2013-01-08 NOTE — MAU Provider Note (Signed)
  History     CSN: [id number]  Arrival date and time: 01/07/13 2245    Chief Complaint  Patient presents with  . Abdominal Pain   HPI  22 y.o. [id number] [redacted]w[redacted]d presents with right and left sided groin pain (right worse than left) for a two day duration.  The patient states that she otherwise feels well.  She denies any vaginal bleeding, vaginal discharge, or fluid leakage.  She reports good fetal movement.     Past Medical History  Diagnosis Date  . No pertinent past medical history   . Bacterial vaginosis   . UTI (lower urinary tract infection)   . Chlamydia     x3 , treated   . Abnormal Pap smear 2007    ASC-US     Past Surgical History  Procedure Laterality Date  . Cesarean section      Family History  Problem Relation Age of Onset  . Other Neg Hx   . Hearing loss Neg Hx     History  Substance Use Topics  . Smoking status: Former Smoker -- 1.00 packs/day for 10 years    Types: Cigarettes  . Smokeless tobacco: Never Used     Comment: with + preg  . Alcohol Use: Yes     Comment: social drinker, has been drinking in September - prior to knowing about pregnancy    Allergies: No Known Allergies  Prescriptions prior to admission  Medication Sig Dispense Refill  . PRENATAL VITAMINS PO Take 1 tablet by mouth daily.        Review of Systems  Constitutional: Negative for fever and chills.  Respiratory: Negative.   Cardiovascular: Negative.   Gastrointestinal: Positive for abdominal pain.  Genitourinary: Negative.   Skin: Negative.    Physical Exam   Blood pressure 119/61, pulse 84, resp. rate 20, height 5\' 5"  (1.651 m), weight 80.287 kg (177 lb), last menstrual period 04/12/2012, SpO2 100.00%.  Physical Exam  Constitutional: She is oriented to person, place, and time. She appears well-developed and well-nourished.  HENT:  Head: Normocephalic and atraumatic.  Neck: Neck supple.  GI: Soft. There is tenderness (Mild tenderness noted with deep palpation of  groin on L and R).  Genitourinary:  FHT - 140 bpm, moderate variability, + acels, no decels present  UC - Occasional  Neurological: She is alert and oriented to person, place, and time.  Skin: Skin is warm and dry.    MAU Course  Procedures  UA negative.  CBC within normal limits.     Assessment and Plan  22 y.o. [id number] [redacted]w[redacted]d presents with right and left sided groin pain (right worse than left) for a two day duration.  Infectious work-up negative.  Patient's symptoms most consistent with round ligament pain.  Patient counseled regarding the use of a maternity support belt which may assist in alleviating some of her pain symptoms.  Keep next scheduled visit at [facility].    Holly Stegall 01/08/2013, 1:21 AM   I have seen and examined this patient and I agree with the above. Cam Hai 1:55 AM 01/08/2013

## 2013-01-08 NOTE — MAU Provider Note (Signed)
Chart reviewed and agree with management and plan.  

## 2013-01-09 ENCOUNTER — Ambulatory Visit (INDEPENDENT_AMBULATORY_CARE_PROVIDER_SITE_OTHER): Payer: Medicaid Other | Admitting: Family

## 2013-01-09 VITALS — BP 116/69 | Temp 97.7°F | Wt 175.2 lb

## 2013-01-09 DIAGNOSIS — O0930 Supervision of pregnancy with insufficient antenatal care, unspecified trimester: Secondary | ICD-10-CM

## 2013-01-09 LAB — POCT URINALYSIS DIP (DEVICE)
Glucose, UA: NEGATIVE mg/dL
Nitrite: NEGATIVE

## 2013-01-09 NOTE — Progress Notes (Signed)
Pt left prior to getting anti-C antibody drawn; please obtain at next visit.

## 2013-01-09 NOTE — Progress Notes (Addendum)
I examined pt and agree with documentation above and PA-S plan of care. Courtney Strong,[name]  

## 2013-01-09 NOTE — Progress Notes (Signed)
Pulse- 102  Pain/pressure-"all over" Pt went to MAU on Tuesday

## 2013-01-09 NOTE — Progress Notes (Signed)
No questions or concerns. Reviewed signs of labor. Denies bleeding, leakage of fluids. Reports some irregular contractions.

## 2013-01-16 ENCOUNTER — Other Ambulatory Visit: Payer: Self-pay | Admitting: Family

## 2013-01-16 ENCOUNTER — Ambulatory Visit (INDEPENDENT_AMBULATORY_CARE_PROVIDER_SITE_OTHER): Payer: Medicaid Other | Admitting: Family

## 2013-01-16 VITALS — BP 118/70 | Temp 97.0°F | Wt 177.3 lb

## 2013-01-16 DIAGNOSIS — O0930 Supervision of pregnancy with insufficient antenatal care, unspecified trimester: Secondary | ICD-10-CM

## 2013-01-16 DIAGNOSIS — Z9149 Other personal history of psychological trauma, not elsewhere classified: Secondary | ICD-10-CM

## 2013-01-16 DIAGNOSIS — IMO0002 Reserved for concepts with insufficient information to code with codable children: Secondary | ICD-10-CM

## 2013-01-16 LAB — POCT URINALYSIS DIP (DEVICE)
Bilirubin Urine: NEGATIVE
Glucose, UA: NEGATIVE mg/dL
Leukocytes, UA: NEGATIVE
Nitrite: NEGATIVE
pH: 6 (ref 5.0–8.0)

## 2013-01-16 NOTE — Progress Notes (Signed)
Reports a lot of round ligament pain; no bleeding or leaking of fluid; membranes swept today; reviewed labor precautions. Obtained anti-c antibody today.

## 2013-01-16 NOTE — Progress Notes (Signed)
Pulse- 94  Pain/pressure- "it hurts all over" Pt requesting to have membranes scraped

## 2013-01-17 ENCOUNTER — Inpatient Hospital Stay (EMERGENCY_DEPARTMENT_HOSPITAL)
Admission: AD | Admit: 2013-01-17 | Discharge: 2013-01-18 | Disposition: A | Discharge status: Home / Self Care | Payer: Medicaid Other | Source: Ambulatory Visit | Attending: Obstetrics & Gynecology | Admitting: Obstetrics & Gynecology

## 2013-01-17 DIAGNOSIS — O479 False labor, unspecified: Secondary | ICD-10-CM

## 2013-01-17 DIAGNOSIS — O34219 Maternal care for unspecified type scar from previous cesarean delivery: Secondary | ICD-10-CM | POA: Diagnosis present

## 2013-01-17 LAB — ANTIBODY TITER (PRENATAL TITER): Ab Titer: 8

## 2013-01-17 NOTE — MAU Note (Signed)
Patient is in for labor eval and spotting (patient states that her membranes was stripped yesterday 2-3cm). Reports good fetal movement and denies lof.

## 2013-01-18 ENCOUNTER — Encounter (HOSPITAL_COMMUNITY): Payer: Self-pay | Admitting: Obstetrics and Gynecology

## 2013-01-18 ENCOUNTER — Inpatient Hospital Stay (HOSPITAL_COMMUNITY)
Admission: AD | Admit: 2013-01-18 | Discharge: 2013-01-19 | Disposition: A | Discharge status: Home / Self Care | Payer: Medicaid Other | Source: Ambulatory Visit | Attending: Obstetrics and Gynecology | Admitting: Obstetrics and Gynecology

## 2013-01-18 ENCOUNTER — Inpatient Hospital Stay (HOSPITAL_COMMUNITY)
Admission: AD | Admit: 2013-01-18 | Discharge: 2013-01-18 | Disposition: A | Discharge status: Home / Self Care | Payer: Medicaid Other | Source: Ambulatory Visit | Attending: Obstetrics and Gynecology | Admitting: Obstetrics and Gynecology

## 2013-01-18 DIAGNOSIS — O479 False labor, unspecified: Secondary | ICD-10-CM

## 2013-01-18 DIAGNOSIS — A568 Sexually transmitted chlamydial infection of other sites: Secondary | ICD-10-CM

## 2013-01-18 DIAGNOSIS — Z23 Encounter for immunization: Secondary | ICD-10-CM

## 2013-01-18 DIAGNOSIS — IMO0002 Reserved for concepts with insufficient information to code with codable children: Secondary | ICD-10-CM

## 2013-01-18 DIAGNOSIS — O34219 Maternal care for unspecified type scar from previous cesarean delivery: Secondary | ICD-10-CM

## 2013-01-18 DIAGNOSIS — O0932 Supervision of pregnancy with insufficient antenatal care, second trimester: Secondary | ICD-10-CM

## 2013-01-18 MED ORDER — MORPHINE SULFATE 4 MG/ML IJ SOLN
2.0000 mg | Freq: Once | INTRAMUSCULAR | Status: AC
  Administered 2013-01-18: 2 mg via INTRAMUSCULAR
  Filled 2013-01-18: qty 1

## 2013-01-18 MED ORDER — MORPHINE SULFATE 4 MG/ML IJ SOLN
4.0000 mg | Freq: Once | INTRAMUSCULAR | Status: AC
  Administered 2013-01-18: 4 mg via INTRAVENOUS
  Filled 2013-01-18: qty 1

## 2013-01-18 MED ORDER — ZOLPIDEM TARTRATE 5 MG PO TABS
5.0000 mg | ORAL_TABLET | Freq: Once | ORAL | Status: AC
  Administered 2013-01-18: 5 mg via ORAL
  Filled 2013-01-18: qty 1

## 2013-01-18 MED ORDER — OXYCODONE-ACETAMINOPHEN 5-325 MG PO TABS
1.0000 | ORAL_TABLET | Freq: Once | ORAL | Status: AC
  Administered 2013-01-18: 1 via ORAL
  Filled 2013-01-18: qty 1

## 2013-01-18 NOTE — Progress Notes (Signed)
Will monitor patient for an additional hour, and recheck her cervix.

## 2013-01-18 NOTE — MAU Note (Signed)
Pt reports she had had increasing ctx  Since yesterday. 1cm yesterday. Denies SROM or bleeding . Mucus plug came out. Good fetal movement reported.

## 2013-01-18 NOTE — MAU Provider Note (Signed)
  History     CSN: [id number]  Arrival date and time: 01/17/13 2335   None     Chief Complaint  Patient presents with  . Contractions  . Vaginal Bleeding   HPI Ms Massing is a 22yo [id number] at 38.5wks who presents for eval of contractions. Denies leak or bldg. Reports +FM. Her preg has been followed by the [facility] and has been remarkable for prev C/S with desire for VBAC this delivery.  OB History   Grav Para Term Preterm Abortions TAB SAB Ect Mult Living   4 1 1  2 1 1   1       Past Medical History  Diagnosis Date  . No pertinent past medical history   . Bacterial vaginosis   . UTI (lower urinary tract infection)   . Chlamydia     x3 , treated   . Abnormal Pap smear 2007    ASC-US     Past Surgical History  Procedure Laterality Date  . Cesarean section      Family History  Problem Relation Age of Onset  . Other Neg Hx   . Hearing loss Neg Hx     History  Substance Use Topics  . Smoking status: Former Smoker -- 1.00 packs/day for 10 years    Types: Cigarettes  . Smokeless tobacco: Never Used     Comment: with + preg  . Alcohol Use: Yes     Comment: social drinker, has been drinking in September - prior to knowing about pregnancy    Allergies: No Known Allergies  Prescriptions prior to admission  Medication Sig Dispense Refill  . PRENATAL VITAMINS PO Take 1 tablet by mouth daily.        ROS Physical Exam   Height 5\' 5"  (1.651 m), weight 80.91 kg (178 lb 6 oz), last menstrual period 04/12/2012.  Physical Exam  Constitutional: She is oriented to person, place, and time. She appears well-developed.  HENT:  Head: Normocephalic.  Neck: Normal range of motion.  Cardiovascular: Normal rate.   Respiratory: Effort normal.  GI:  FHR 130s +accels, no decels Ctx irreg, no pattern  Genitourinary: Vagina normal.  Musculoskeletal: Normal range of motion.  Neurological: She is alert and oriented to person, place, and time.  Skin: Skin is warm and dry.   Psychiatric: She has a normal mood and affect. Her behavior is normal. Thought content normal.    MAU Course  Procedures  Extended monitoring/ambulation- first exam at 2350 (1.5/50%), second at 0235 (unchanged)  Assessment and Plan  IUP at 38.5wks Prev C/S with desire for VBAC Braxton Hicks ctx  D/C home with labor precautions F/U as scheduled for next visit on 7/3 or sooner prn  SHAW, KIMBERLY 01/18/2013, 2:40 AM

## 2013-01-18 NOTE — Progress Notes (Signed)
Philipp Deputy cnm notified of patient, her tracing, ctx pattern and sve result. Plan to monitor for an hour and recheck.

## 2013-01-18 NOTE — MAU Note (Signed)
Pt presents with increasing contractions since 1900 this pm.  Pt was seen in MAU last evening.  Pt was 2.5 cm at that time.   + fm.  Pt denies pih symptoms ot srom.  Bloody show noted on tissues.

## 2013-01-19 ENCOUNTER — Encounter (HOSPITAL_COMMUNITY): Payer: Self-pay | Admitting: Anesthesiology

## 2013-01-19 ENCOUNTER — Inpatient Hospital Stay (HOSPITAL_COMMUNITY)
Admission: AD | Admit: 2013-01-19 | Discharge: 2013-01-21 | DRG: 775 | Disposition: A | Discharge status: Home / Self Care | Payer: Medicaid Other | Source: Ambulatory Visit | Attending: Obstetrics & Gynecology | Admitting: Obstetrics & Gynecology

## 2013-01-19 ENCOUNTER — Encounter: Payer: Self-pay | Admitting: Family

## 2013-01-19 ENCOUNTER — Encounter (HOSPITAL_COMMUNITY): Payer: Self-pay | Admitting: *Deleted

## 2013-01-19 ENCOUNTER — Inpatient Hospital Stay (HOSPITAL_COMMUNITY): Payer: Medicaid Other | Admitting: Anesthesiology

## 2013-01-19 DIAGNOSIS — O34219 Maternal care for unspecified type scar from previous cesarean delivery: Secondary | ICD-10-CM

## 2013-01-19 DIAGNOSIS — O0933 Supervision of pregnancy with insufficient antenatal care, third trimester: Secondary | ICD-10-CM

## 2013-01-19 DIAGNOSIS — IMO0002 Reserved for concepts with insufficient information to code with codable children: Secondary | ICD-10-CM

## 2013-01-19 DIAGNOSIS — A568 Sexually transmitted chlamydial infection of other sites: Secondary | ICD-10-CM

## 2013-01-19 DIAGNOSIS — Z23 Encounter for immunization: Secondary | ICD-10-CM

## 2013-01-19 LAB — CBC
MCH: 31 pg (ref 26.0–34.0)
MCV: 88.4 fL (ref 78.0–100.0)
Platelets: 155 10*3/uL (ref 150–400)
RBC: 4.55 MIL/uL (ref 3.87–5.11)

## 2013-01-19 LAB — PREPARE RBC (CROSSMATCH)

## 2013-01-19 MED ORDER — LIDOCAINE HCL (PF) 1 % IJ SOLN: 30.0000 mL | INTRAMUSCULAR | Status: DC | PRN

## 2013-01-19 MED ORDER — IBUPROFEN 600 MG PO TABS
600.0000 mg | ORAL_TABLET | Freq: Four times a day (QID) | ORAL | Status: DC
  Administered 2013-01-20 – 2013-01-21 (×7): 600 mg via ORAL
  Filled 2013-01-19 (×7): qty 1

## 2013-01-19 MED ORDER — LIDOCAINE HCL (PF) 1 % IJ SOLN
INTRAMUSCULAR | Status: DC | PRN
  Administered 2013-01-19 (×4): 4 mL

## 2013-01-19 MED ORDER — OXYTOCIN BOLUS FROM INFUSION: 500.0000 mL | INTRAVENOUS | Status: DC

## 2013-01-19 MED ORDER — CITRIC ACID-SODIUM CITRATE 334-500 MG/5ML PO SOLN: 30.0000 mL | ORAL | Status: DC | PRN

## 2013-01-19 MED ORDER — DIPHENHYDRAMINE HCL 25 MG PO CAPS: 25.0000 mg | ORAL_CAPSULE | Freq: Four times a day (QID) | ORAL | Status: DC | PRN

## 2013-01-19 MED ORDER — NALBUPHINE SYRINGE 5 MG/0.5 ML
10.0000 mg | INJECTION | INTRAMUSCULAR | Status: DC | PRN
  Administered 2013-01-19: 10 mg via INTRAVENOUS
  Filled 2013-01-19 (×2): qty 1

## 2013-01-19 MED ORDER — LACTATED RINGERS IV SOLN
500.0000 mL | INTRAVENOUS | Status: DC | PRN
Start: 2013-01-19 — End: 2013-01-19

## 2013-01-19 MED ORDER — OXYCODONE-ACETAMINOPHEN 5-325 MG PO TABS: 1.0000 | ORAL_TABLET | ORAL | Status: DC | PRN

## 2013-01-19 MED ORDER — ZOLPIDEM TARTRATE 5 MG PO TABS: 5.0000 mg | ORAL_TABLET | Freq: Every evening | ORAL | Status: DC | PRN

## 2013-01-19 MED ORDER — FLEET ENEMA 7-19 GM/118ML RE ENEM: 1.0000 | ENEMA | RECTAL | Status: DC | PRN

## 2013-01-19 MED ORDER — SENNOSIDES-DOCUSATE SODIUM 8.6-50 MG PO TABS
2.0000 | ORAL_TABLET | Freq: Every day | ORAL | Status: DC
  Administered 2013-01-19 – 2013-01-20 (×2): 2 via ORAL

## 2013-01-19 MED ORDER — ONDANSETRON HCL 4 MG PO TABS: 4.0000 mg | ORAL_TABLET | ORAL | Status: DC | PRN

## 2013-01-19 MED ORDER — IBUPROFEN 600 MG PO TABS: 600.0000 mg | ORAL_TABLET | Freq: Four times a day (QID) | ORAL | Status: DC | PRN

## 2013-01-19 MED ORDER — LACTATED RINGERS IV SOLN
INTRAVENOUS | Status: DC
  Administered 2013-01-19 (×2): via INTRAVENOUS

## 2013-01-19 MED ORDER — TETANUS-DIPHTH-ACELL PERTUSSIS 5-2.5-18.5 LF-MCG/0.5 IM SUSP: 0.5000 mL | Freq: Once | INTRAMUSCULAR | Status: DC

## 2013-01-19 MED ORDER — PHENYLEPHRINE 40 MCG/ML (10ML) SYRINGE FOR IV PUSH (FOR BLOOD PRESSURE SUPPORT)
80.0000 ug | PREFILLED_SYRINGE | INTRAVENOUS | Status: DC | PRN
  Filled 2013-01-19: qty 5
  Filled 2013-01-19: qty 2

## 2013-01-19 MED ORDER — OXYTOCIN 40 UNITS IN LACTATED RINGERS INFUSION - SIMPLE MED
1.0000 m[IU]/min | INTRAVENOUS | Status: DC
  Administered 2013-01-19: 1 m[IU]/min via INTRAVENOUS

## 2013-01-19 MED ORDER — PRENATAL MULTIVITAMIN CH
1.0000 | ORAL_TABLET | Freq: Every day | ORAL | Status: DC
  Administered 2013-01-20 – 2013-01-21 (×2): 1 via ORAL
  Filled 2013-01-19 (×2): qty 1

## 2013-01-19 MED ORDER — LACTATED RINGERS IV SOLN: 500.0000 mL | INTRAVENOUS | Status: DC | PRN

## 2013-01-19 MED ORDER — LACTATED RINGERS IV SOLN: INTRAVENOUS | Status: DC

## 2013-01-19 MED ORDER — FENTANYL 2.5 MCG/ML BUPIVACAINE 1/10 % EPIDURAL INFUSION (WH - ANES)
14.0000 mL/h | INTRAMUSCULAR | Status: DC | PRN
  Administered 2013-01-19 (×2): 14 mL/h via EPIDURAL
  Filled 2013-01-19 (×2): qty 125

## 2013-01-19 MED ORDER — EPHEDRINE 5 MG/ML INJ
10.0000 mg | INTRAVENOUS | Status: DC | PRN
  Filled 2013-01-19: qty 2
  Filled 2013-01-19: qty 4

## 2013-01-19 MED ORDER — ACETAMINOPHEN 325 MG PO TABS: 650.0000 mg | ORAL_TABLET | ORAL | Status: DC | PRN

## 2013-01-19 MED ORDER — OXYTOCIN 40 UNITS IN LACTATED RINGERS INFUSION - SIMPLE MED: 62.5000 mL/h | INTRAVENOUS | Status: DC

## 2013-01-19 MED ORDER — SIMETHICONE 80 MG PO CHEW: 80.0000 mg | CHEWABLE_TABLET | ORAL | Status: DC | PRN

## 2013-01-19 MED ORDER — LIDOCAINE HCL (PF) 1 % IJ SOLN
30.0000 mL | INTRAMUSCULAR | Status: DC | PRN
  Filled 2013-01-19: qty 30

## 2013-01-19 MED ORDER — ONDANSETRON HCL 4 MG/2ML IJ SOLN: 4.0000 mg | Freq: Four times a day (QID) | INTRAMUSCULAR | Status: DC | PRN

## 2013-01-19 MED ORDER — OXYTOCIN 40 UNITS IN LACTATED RINGERS INFUSION - SIMPLE MED
62.5000 mL/h | INTRAVENOUS | Status: DC
  Filled 2013-01-19: qty 1000

## 2013-01-19 MED ORDER — ONDANSETRON HCL 4 MG/2ML IJ SOLN
4.0000 mg | INTRAMUSCULAR | Status: DC | PRN
Start: 2013-01-19 — End: 2013-01-21

## 2013-01-19 MED ORDER — OXYCODONE-ACETAMINOPHEN 5-325 MG PO TABS
1.0000 | ORAL_TABLET | ORAL | Status: DC | PRN
  Administered 2013-01-20 – 2013-01-21 (×6): 1 via ORAL
  Filled 2013-01-19 (×6): qty 1

## 2013-01-19 MED ORDER — LANOLIN HYDROUS EX OINT: TOPICAL_OINTMENT | CUTANEOUS | Status: DC | PRN

## 2013-01-19 MED ORDER — BUPIVACAINE HCL (PF) 0.25 % IJ SOLN
INTRAMUSCULAR | Status: DC | PRN
  Administered 2013-01-19: 5 mL via EPIDURAL

## 2013-01-19 MED ORDER — BENZOCAINE-MENTHOL 20-0.5 % EX AERO
1.0000 "application " | INHALATION_SPRAY | CUTANEOUS | Status: DC | PRN
  Filled 2013-01-19: qty 56

## 2013-01-19 MED ORDER — EPHEDRINE 5 MG/ML INJ
10.0000 mg | INTRAVENOUS | Status: DC | PRN
  Filled 2013-01-19: qty 2

## 2013-01-19 MED ORDER — LACTATED RINGERS IV SOLN: 500.0000 mL | Freq: Once | INTRAVENOUS | Status: DC

## 2013-01-19 MED ORDER — ONDANSETRON HCL 4 MG/2ML IJ SOLN
4.0000 mg | Freq: Four times a day (QID) | INTRAMUSCULAR | Status: DC | PRN
  Administered 2013-01-19: 4 mg via INTRAVENOUS
  Filled 2013-01-19: qty 2

## 2013-01-19 MED ORDER — WITCH HAZEL-GLYCERIN EX PADS: 1.0000 "application " | MEDICATED_PAD | CUTANEOUS | Status: DC | PRN

## 2013-01-19 MED ORDER — PHENYLEPHRINE 40 MCG/ML (10ML) SYRINGE FOR IV PUSH (FOR BLOOD PRESSURE SUPPORT)
80.0000 ug | PREFILLED_SYRINGE | INTRAVENOUS | Status: DC | PRN
  Filled 2013-01-19: qty 2

## 2013-01-19 MED ORDER — TERBUTALINE SULFATE 1 MG/ML IJ SOLN: 0.2500 mg | Freq: Once | INTRAMUSCULAR | Status: DC | PRN

## 2013-01-19 MED ORDER — DIPHENHYDRAMINE HCL 50 MG/ML IJ SOLN: 12.5000 mg | INTRAMUSCULAR | Status: DC | PRN

## 2013-01-19 MED ORDER — DIBUCAINE 1 % RE OINT: 1.0000 "application " | TOPICAL_OINTMENT | RECTAL | Status: DC | PRN

## 2013-01-19 NOTE — MAU Note (Signed)
Pt reprots ctx stronger. Unable to sleep last night reports bloody show and good fetal movement

## 2013-01-19 NOTE — H&P (Signed)
Courtney Strong is a 22 y.o. female presenting for onset of labor. Pt has been contracting for a week, but ctx became more intense last night. Has been seen several times recently for labor checks.  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 Ultrasounds normal. Previous c-section for oligo and non-reassuring fetal heart tones (did not labor per note) at 37 wk. VBAC consent signed and on chart.  Maternal Medical History:  Reason for admission: Contractions.   Contractions: Onset was more than 2 days ago.   Frequency: regular.   Perceived severity is strong.    Fetal activity: Perceived fetal activity is normal.   Last perceived fetal movement was within the past hour.    Prenatal complications: no prenatal complications Prenatal Complications - Diabetes: none.    OB History   Grav Para Term Preterm Abortions TAB SAB Ect Mult Living   4 1 1  2 1 1   1      Past Medical History  Diagnosis Date  . No pertinent past medical history   . Bacterial vaginosis   . UTI (lower urinary tract infection)   . Chlamydia     x3 , treated   . Abnormal Pap smear 2007    ASC-US    Past Surgical History  Procedure Laterality Date  . Cesarean section     Family History: family history is negative for Other and Hearing loss. Social History:  reports that she has quit smoking. Her smoking use included Cigarettes. She has a 10 pack-year smoking history. She has never used smokeless tobacco. She reports that  drinks alcohol. She reports that she does not use illicit drugs.   Prenatal Transfer Tool  Maternal Diabetes: No Genetic Screening: Declined Maternal Ultrasounds/Referrals: Normal Fetal Ultrasounds or other Referrals:  None Maternal Substance Abuse:  No Significant Maternal Medications:  None Significant Maternal Lab Results:  Lab values include: Group B Strep negative Other Comments:  Anti-C antibodies with increasing titer  ROS  See HPI  Dilation: 5 Effacement (%):  80 Station: -2;-1 Exam by:: dr Gailya Tauer Height 5\' 5"  (1.651 m), weight 80.74 kg (178 lb), last menstrual period 04/12/2012.  Exam Physical Exam   GEN:  WNWD, no distress HEENT:  NCAT, EOMI, conjunctiva clear CV: Regular rate RESP:  No distress ABD:  Soft, non-tender, no guarding or rebound EXTREM:  Warm, well perfused, no edema or tenderness NEURO:  Alert, oriented, no focal deficits  FHTS:  140, mod var, accels present, no decels  Prenatal labs: ABO, Rh:   Antibody: POS (06/26 1021) Rubella: Immune (03/12 0000) RPR: Nonreactive (03/12 0000)  HBsAg: Negative (03/12 0000)  HIV: Non-reactive (03/12 0000)  GBS: Negative (06/08 0000)   Assessment/Plan: 21 y.o. [id number] at [redacted]w[redacted]d with SOL - GBS neg - TOLAC desired, consent signed and on chart - Epidural when desired - Anticipate successful VBAC   Napoleon Form 01/19/2013, 9:07 AM

## 2013-01-19 NOTE — Anesthesia Preprocedure Evaluation (Signed)
Anesthesia Evaluation  Patient identified by MRN, date of birth, ID band Patient awake    Reviewed: Allergy & Precautions, H&P , NPO status , Patient's Chart, lab work & pertinent test results, reviewed documented beta blocker date and time   History of Anesthesia Complications Negative for: history of anesthetic complications  Airway Mallampati: I TM Distance: >3 FB Neck ROM: full    Dental  (+) Teeth Intact   Pulmonary former smoker,  breath sounds clear to auscultation        Cardiovascular negative cardio ROS  Rhythm:regular Rate:Normal     Neuro/Psych negative neurological ROS  negative psych ROS   GI/Hepatic negative GI ROS, Neg liver ROS,   Endo/Other  negative endocrine ROS  Renal/GU negative Renal ROS     Musculoskeletal   Abdominal   Peds  Hematology negative hematology ROS (+)   Anesthesia Other Findings   Reproductive/Obstetrics (+) Pregnancy (h/o c/s x1, attempting VBAC)                           Anesthesia Physical Anesthesia Plan  ASA: II  Anesthesia Plan: Epidural   Post-op Pain Management:    Induction:   Airway Management Planned:   Additional Equipment:   Intra-op Plan:   Post-operative Plan:   Informed Consent: I have reviewed the patients History and Physical, chart, labs and discussed the procedure including the risks, benefits and alternatives for the proposed anesthesia with the patient or authorized representative who has indicated his/her understanding and acceptance.     Plan Discussed with:   Anesthesia Plan Comments:         Anesthesia Quick Evaluation

## 2013-01-19 NOTE — Anesthesia Procedure Notes (Signed)
Epidural Patient location during procedure: OB Start time: 01/19/2013 9:42 AM  Staffing Performed by: anesthesiologist   Preanesthetic Checklist Completed: patient identified, site marked, surgical consent, pre-op evaluation, timeout performed, IV checked, risks and benefits discussed and monitors and equipment checked  Epidural Patient position: sitting Prep: site prepped and draped and DuraPrep Patient monitoring: continuous pulse ox and blood pressure Approach: midline Injection technique: LOR air  Needle:  Needle type: Tuohy  Needle gauge: 17 G Needle length: 9 cm and 9 Needle insertion depth: 5 cm cm Catheter type: closed end flexible Catheter size: 19 Gauge Catheter at skin depth: 10 cm Test dose: negative  Assessment Events: blood not aspirated, injection not painful, no injection resistance, negative IV test and no paresthesia  Additional Notes Discussed risk of headache, infection, bleeding, nerve injury and failed or incomplete block.  Patient voices understanding and wishes to proceed.  Epidural placed easily on first attempt.  No paresthesia.  Patient tolerated procedure well with no apparent complications.  Jasmine December, MDReason for block:procedure for pain

## 2013-01-19 NOTE — Progress Notes (Signed)
Courtney Strong is a 22 y.o. [phone] at [redacted]w[redacted]d by ultrasound (05/29/12) admitted for active labor  Subjective: Comfortable with epidural.   Objective: BP 112/76  Pulse 96  Temp(Src) 98.1 F (36.7 C) (Oral)  Resp 18  Ht 5\' 5"  (1.651 m)  Wt 80.74 kg (178 lb)  BMI 29.62 kg/m2  SpO2 97%  LMP 04/12/2012      FHT:  FHR: 125 bpm, variability: moderate,  accelerations:  Present,  decelerations:  Absent UC:   Irregular, every 5 + mins SVE:   Dilation: 7 Effacement (%): 80 Station: -1 Exam by:: dr Adriana Simas  Labs: Lab Results  Component Value Date   WBC 15.5* 01/19/2013   HGB 14.1 01/19/2013   HCT 40.2 01/19/2013   MCV 88.4 01/19/2013   PLT 155 01/19/2013    Assessment / Plan: Spontaneous labor, progressing normally Making cervical change but slightly less than expected. Irregular contraction pattern. Will augment with pitocin.  Labor: Progressing normally; Will augment with pitocin Fetal Wellbeing:  Category I Pain Control:  Epidural Anticipated MOD:  NSVD  Courtney Strong Other 01/19/2013, 3:14 PM

## 2013-01-19 NOTE — Progress Notes (Addendum)
Courtney Strong is a 22 y.o. [phone] at [redacted]w[redacted]d by ultrasound (05/29/12) admitted for active labor  Subjective: Comfortable with epidural  Objective: BP 106/44  Pulse 115  Temp(Src) 98.1 F (36.7 C) (Oral)  Resp 20  Ht 5\' 5"  (1.651 m)  Wt 80.74 kg (178 lb)  BMI 29.62 kg/m2  SpO2 98%  LMP 04/12/2012      FHT:  FHR: 130 bpm, variability: moderate,  accelerations:  Present,  decelerations:  Absent UC:   Every 4-5 mins SVE:   Dilation: 5.5 Effacement (%): 80 Station: -1 Exam by:: dr Adriana Simas  Labs: Lab Results  Component Value Date   WBC 15.5* 01/19/2013   HGB 14.1 01/19/2013   HCT 40.2 01/19/2013   MCV 88.4 01/19/2013   PLT 155 01/19/2013    Assessment / Plan: Spontaneous labor, progressing normally AROM @ 1209  Labor: Progressing normally Fetal Wellbeing:  Category I Pain Control:  Epidural Anticipated MOD:  NSVD  [name] Other 01/19/2013, 12:12 PM

## 2013-01-20 LAB — CBC
MCH: 30.5 pg (ref 26.0–34.0)
MCHC: 34.5 g/dL (ref 30.0–36.0)
Platelets: 159 10*3/uL (ref 150–400)

## 2013-01-20 NOTE — Anesthesia Postprocedure Evaluation (Signed)
  Anesthesia Post-op Note  Patient: Courtney Strong  Procedure(s) Performed: * No procedures listed *  Patient Location: PACU and Mother/Baby  Anesthesia Type:Epidural  Level of Consciousness: awake  Airway and Oxygen Therapy: Patient Spontanous Breathing  Post-op Pain: 0 /10  Post-op Assessment: Patient's Cardiovascular Status Stable, Respiratory Function Stable, Patent Airway, No signs of Nausea or vomiting, Adequate PO intake, Pain level controlled, No headache, No backache, No residual numbness and No residual motor weakness  Post-op Vital Signs: Reviewed and stable  Complications: No apparent anesthesia complications

## 2013-01-20 NOTE — Progress Notes (Signed)
Ur chart review completed.  

## 2013-01-20 NOTE — Progress Notes (Signed)
Post Partum Day 1 Subjective: no complaints, up ad lib, voiding and tolerating PO  Objective: Blood pressure 101/63, pulse 78, temperature 97.4 F (36.3 C), temperature source Oral, resp. rate 18, height 5\' 5"  (1.651 m), weight 178 lb (80.74 kg), last menstrual period 04/12/2012, SpO2 97.00%, unknown if currently breastfeeding.  Physical Exam:  General: alert, cooperative and no distress Lochia: appropriate Uterine Fundus: firm  DVT Evaluation: No evidence of DVT seen on physical exam.    Recent Labs  01/19/13 0840 01/20/13 0552  HGB 14.1 12.4  HCT 40.2 35.9*    Assessment/Plan: Plan for discharge tomorrow, Breastfeeding and Contraception Plans on BTL in September, [place] as a bridge   LOS: 1 day   Gareld Obrecht H 01/20/2013, 7:24 AM

## 2013-01-21 LAB — TYPE AND SCREEN
DAT, IgG: NEGATIVE
PT AG Type: NEGATIVE
Unit division: 0

## 2013-01-21 LAB — POCT TRANSCUTANEOUS BILIRUBIN (TCB)
Age (hours): 20 hours
POCT Transcutaneous Bilirubin (TcB): 11.4

## 2013-01-21 MED ORDER — IBUPROFEN 600 MG PO TABS: 600.0000 mg | ORAL_TABLET | Freq: Four times a day (QID) | ORAL | Status: DC

## 2013-01-21 NOTE — Discharge Summary (Signed)
Attestation of Attending Supervision of Advanced Practitioner (CNM/NP): Evaluation and management procedures were performed by the Advanced Practitioner under my supervision and collaboration.  I have reviewed the Advanced Practitioner's note and chart, and I agree with the management and plan.  Jedediah Noda 01/21/2013 7:59 AM

## 2013-01-21 NOTE — Discharge Summary (Signed)
Obstetric Discharge Summary Reason for Admission: onset of labor Prenatal Procedures: ultrasound Intrapartum Procedures: spontaneous vaginal delivery Postpartum Procedures: none Complications-Operative and Postpartum: none Hemoglobin  Date Value Range Status  01/20/2013 12.4  12.0 - 15.0 g/dL Final     HCT  Date Value Range Status  01/20/2013 35.9* 36.0 - 46.0 % Final  10/02/2012 37   Final    Physical Exam:  General: alert, cooperative and no distress Lochia: appropriate Uterine Fundus: firm Incision: NA DVT Evaluation: No evidence of DVT seen on physical exam.  Discharge Diagnoses: Term Pregnancy-delivered  Discharge Information: Date: 01/21/2013 Activity: unrestricted Diet: routine Medications: Ibuprofen Condition: stable Instructions: refer to practice specific booklet Discharge to: home Follow-up Information   Follow up with [facility] In 6 weeks.   Contact information:   [address] [place] [address]       Newborn Data: Live born female  Birth Weight: 7 lb 11.5 oz (3500 g) APGAR: 9, 9  Home with mother.  Courtney Strong 01/21/2013, 7:46 AM

## 2013-01-23 ENCOUNTER — Encounter: Payer: Medicaid Other | Admitting: Advanced Practice Midwife

## 2013-06-01 ENCOUNTER — Emergency Department (HOSPITAL_COMMUNITY)
Admission: EM | Admit: 2013-06-01 | Discharge: 2013-06-01 | Disposition: A | Discharge status: Home / Self Care | Payer: Medicaid Other | Attending: Emergency Medicine | Admitting: Emergency Medicine

## 2013-06-01 ENCOUNTER — Encounter (HOSPITAL_COMMUNITY): Payer: Self-pay | Admitting: Emergency Medicine

## 2013-06-01 DIAGNOSIS — R05 Cough: Secondary | ICD-10-CM | POA: Insufficient documentation

## 2013-06-01 DIAGNOSIS — R059 Cough, unspecified: Secondary | ICD-10-CM | POA: Insufficient documentation

## 2013-06-01 DIAGNOSIS — H6690 Otitis media, unspecified, unspecified ear: Secondary | ICD-10-CM | POA: Insufficient documentation

## 2013-06-01 DIAGNOSIS — J069 Acute upper respiratory infection, unspecified: Secondary | ICD-10-CM

## 2013-06-01 DIAGNOSIS — R0989 Other specified symptoms and signs involving the circulatory and respiratory systems: Secondary | ICD-10-CM | POA: Insufficient documentation

## 2013-06-01 DIAGNOSIS — F17200 Nicotine dependence, unspecified, uncomplicated: Secondary | ICD-10-CM | POA: Insufficient documentation

## 2013-06-01 DIAGNOSIS — J3489 Other specified disorders of nose and nasal sinuses: Secondary | ICD-10-CM | POA: Insufficient documentation

## 2013-06-01 MED ORDER — ALBUTEROL SULFATE HFA 108 (90 BASE) MCG/ACT IN AERS
2.0000 | INHALATION_SPRAY | Freq: Four times a day (QID) | RESPIRATORY_TRACT | Status: DC
  Administered 2013-06-01: 2 via RESPIRATORY_TRACT
  Filled 2013-06-01: qty 6.7

## 2013-06-01 MED ORDER — ACETAMINOPHEN-CODEINE 120-12 MG/5ML PO SOLN: 10.0000 mL | ORAL | Status: DC | PRN

## 2013-06-01 MED ORDER — PREDNISONE 50 MG PO TABS: 50.0000 mg | ORAL_TABLET | Freq: Every day | ORAL | Status: DC

## 2013-06-01 MED ORDER — GUAIFENESIN ER 1200 MG PO TB12: 1.0000 | ORAL_TABLET | Freq: Two times a day (BID) | ORAL | Status: DC

## 2013-06-01 MED ORDER — AZITHROMYCIN 250 MG PO TABS: ORAL_TABLET | ORAL | Status: DC

## 2013-06-01 NOTE — ED Provider Notes (Signed)
Medical screening examination/treatment/procedure(s) were performed by non-physician practitioner and as supervising physician I was immediately available for consultation/collaboration.  EKG Interpretation   None         Ronique Simerly, MD 06/01/13 2233 

## 2013-06-01 NOTE — ED Provider Notes (Signed)
CSN: [id number]     Arrival date & time 06/01/13  1824 History   None     This chart was scribed for non-physician practitioner, Ebbie Ridge PA-C, working with Rolan Bucco, MD by Arlan Organ, ED Scribe. This patient was seen in room WTR8/WTR8 and the patient's care was started at 7:49 PM.   No chief complaint on file.  The history is provided by the patient. No language interpreter was used.   HPI Comments: Courtney Strong is a 22 y.o. female who presents to the Emergency Department complaining of gradual onset, gradually worsening, constant left ear pain that started 2 weeks ago. She also reports sinus pressure, chest congestion, and a productive cough consisting of yellow sputum. Pt describes the ear pain as "throbbing", and reports not taking anything OTC for the discomfort. Pt is currently a smoker.   Past Medical History  Diagnosis Date  . No pertinent past medical history   . Bacterial vaginosis   . UTI (lower urinary tract infection)   . Chlamydia     x3 , treated   . Abnormal Pap smear 2007    ASC-US    Past Surgical History  Procedure Laterality Date  . Cesarean section     Family History  Problem Relation Age of Onset  . Other Neg Hx   . Hearing loss Neg Hx    History  Substance Use Topics  . Smoking status: Former Smoker -- 1.00 packs/day for 10 years    Types: Cigarettes  . Smokeless tobacco: Never Used     Comment: with + preg  . Alcohol Use: Yes     Comment: social drinker, has been drinking in September - prior to knowing about pregnancy   OB History   Grav Para Term Preterm Abortions TAB SAB Ect Mult Living   4 2 2  2 1 1   2      Review of Systems A complete 10 system review of systems was obtained and all systems are negative except as noted in the HPI and PMH.   Allergies  Review of patient's allergies indicates no known allergies.  Home Medications   Current Outpatient Rx  Name  Route  Sig  Dispense  Refill  . ibuprofen  (ADVIL,MOTRIN) 600 MG tablet   Oral   Take 1 tablet (600 mg total) by mouth every 6 (six) hours.   30 tablet   0   . PRENATAL VITAMINS PO   Oral   Take 1 tablet by mouth daily.          BP 118/65  Pulse 103  Temp(Src) 99.1 F (37.3 C) (Oral)  Resp 18  SpO2 99% Physical Exam  Nursing note and vitals reviewed. Constitutional: She is oriented to person, place, and time. She appears well-developed and well-nourished.  HENT:  Head: Normocephalic and atraumatic.  Right Ear: Tympanic membrane normal.  Left Ear: Tympanic membrane is erythematous and bulging. A middle ear effusion is present. Decreased hearing is noted.  Nose: Mucosal edema and rhinorrhea present.  Mouth/Throat: Uvula is midline and oropharynx is clear and moist. No uvula swelling.  Eyes: EOM are normal. Pupils are equal, round, and reactive to light.  Neck: Normal range of motion.  Cardiovascular: Normal rate and normal heart sounds.  Exam reveals no gallop and no friction rub.   No murmur heard. Pulmonary/Chest: Effort normal. No respiratory distress. She has rhonchi.  Musculoskeletal: Normal range of motion.  Neurological: She is alert and oriented to person, place,  and time.  Skin: Skin is warm and dry.  Psychiatric: She has a normal mood and affect. Her behavior is normal.    ED Course  Procedures (including critical care time)  DIAGNOSTIC STUDIES: Oxygen Saturation is 99% on RA, Normal by my interpretation.    COORDINATION OF CARE: 7:49 PM-Discussed treatment plan with pt at bedside and pt agreed to plan.     I personally performed the services described in this documentation, which was scribed in my presence. The recorded information has been reviewed and is accurate.   Carlyle Dolly, PA-C 06/01/13 1955

## 2013-06-01 NOTE — ED Notes (Addendum)
Pt complain of left earache  For 2 weeks.  Sinus pressure and chest congestion with productive cough. Sputum is yellow

## 2013-07-24 DIAGNOSIS — Z8619 Personal history of other infectious and parasitic diseases: Secondary | ICD-10-CM

## 2013-07-24 HISTORY — DX: Personal history of other infectious and parasitic diseases: Z86.19

## 2013-08-21 ENCOUNTER — Encounter (HOSPITAL_COMMUNITY): Payer: Self-pay | Admitting: Emergency Medicine

## 2013-08-21 ENCOUNTER — Emergency Department (HOSPITAL_COMMUNITY)
Admission: EM | Admit: 2013-08-21 | Discharge: 2013-08-21 | Disposition: A | Discharge status: Home / Self Care | Payer: Medicaid Other | Attending: Emergency Medicine | Admitting: Emergency Medicine

## 2013-08-21 DIAGNOSIS — Z87440 Personal history of urinary (tract) infections: Secondary | ICD-10-CM | POA: Insufficient documentation

## 2013-08-21 DIAGNOSIS — Z87891 Personal history of nicotine dependence: Secondary | ICD-10-CM | POA: Insufficient documentation

## 2013-08-21 DIAGNOSIS — Z8619 Personal history of other infectious and parasitic diseases: Secondary | ICD-10-CM | POA: Insufficient documentation

## 2013-08-21 DIAGNOSIS — R6889 Other general symptoms and signs: Secondary | ICD-10-CM

## 2013-08-21 DIAGNOSIS — J111 Influenza due to unidentified influenza virus with other respiratory manifestations: Secondary | ICD-10-CM | POA: Insufficient documentation

## 2013-08-21 MED ORDER — OSELTAMIVIR PHOSPHATE 75 MG PO CAPS: 75.0000 mg | ORAL_CAPSULE | Freq: Two times a day (BID) | ORAL | Status: DC

## 2013-08-21 MED ORDER — IBUPROFEN 800 MG PO TABS: 800.0000 mg | ORAL_TABLET | Freq: Four times a day (QID) | ORAL | Status: DC | PRN

## 2013-08-21 MED ORDER — ACETAMINOPHEN 325 MG PO TABS
650.0000 mg | ORAL_TABLET | Freq: Once | ORAL | Status: AC
  Administered 2013-08-21: 650 mg via ORAL
  Filled 2013-08-21: qty 2

## 2013-08-21 MED ORDER — ACETAMINOPHEN 325 MG PO TABS: 650.0000 mg | ORAL_TABLET | Freq: Four times a day (QID) | ORAL | Status: DC | PRN

## 2013-08-21 NOTE — ED Provider Notes (Signed)
CSN: [id number]     Arrival date & time 08/21/13  3710 History   First MD Initiated Contact with Patient 08/21/13 1010    This chart was scribed for Baron Sane PA-C, a non-physician practitioner working with No att. providers found by Denice Bors, ED Scribe. This patient was seen in room TR09C/TR09C and the patient's care was started at 10:23 AM     Chief Complaint  Patient presents with  . Influenza   (Consider location/radiation/quality/duration/timing/severity/associated sxs/prior Treatment) The history is provided by the patient. No language interpreter was used.   HPI Comments: BRIANNE MAINA is a 23 y.o. female who presents to the Emergency Department complaining of constant worsening generalized myalgias onset last night. Reports associated headaches, sore throat, low grade fever, and chills. Denies trying any alleviated factors. Denies any aggravating factors. Denies associated nausea, emesis, and diarrhea. Denies sick contacts.  Past Medical History  Diagnosis Date  . No pertinent past medical history   . Bacterial vaginosis   . UTI (lower urinary tract infection)   . Chlamydia     x3 , treated   . Abnormal Pap smear 2007    ASC-US    Past Surgical History  Procedure Laterality Date  . Cesarean section     Family History  Problem Relation Age of Onset  . Other Neg Hx   . Hearing loss Neg Hx    History  Substance Use Topics  . Smoking status: Former Smoker -- 1.00 packs/day for 10 years    Types: Cigarettes  . Smokeless tobacco: Never Used     Comment: with + preg  . Alcohol Use: Yes     Comment: social drinker, has been drinking in September - prior to knowing about pregnancy   OB History   Grav Para Term Preterm Abortions TAB SAB Ect Mult Living   4 2 2  2 1 1   2      Review of Systems  Constitutional: Positive for fever (subjective) and chills (subjective).  HENT: Positive for sore throat.   Gastrointestinal: Negative for nausea,  vomiting and diarrhea.  Musculoskeletal: Positive for myalgias.  Neurological: Positive for headaches.  Psychiatric/Behavioral: Negative for confusion.    Allergies  Review of patient's allergies indicates no known allergies.  Home Medications   Current Outpatient Rx  Name  Route  Sig  Dispense  Refill  . acetaminophen (TYLENOL) 325 MG tablet   Oral   Take 2 tablets (650 mg total) by mouth every 6 (six) hours as needed for mild pain, moderate pain or fever.   30 tablet   0   . ibuprofen (ADVIL,MOTRIN) 800 MG tablet   Oral   Take 1 tablet (800 mg total) by mouth every 6 (six) hours as needed for fever, mild pain or moderate pain.   21 tablet   0   . oseltamivir (TAMIFLU) 75 MG capsule   Oral   Take 1 capsule (75 mg total) by mouth every 12 (twelve) hours.   10 capsule   0    BP 117/73  Pulse 94  Temp(Src) 100.2 F (37.9 C) (Oral)  Resp 18  SpO2 97% Physical Exam  Constitutional: She is oriented to person, place, and time. She appears well-developed and well-nourished. No distress.  HENT:  Head: Normocephalic and atraumatic.  Right Ear: External ear normal.  Left Ear: External ear normal.  Nose: Nose normal.  Mouth/Throat: Uvula is midline and mucous membranes are normal. No trismus in the jaw. Posterior oropharyngeal erythema  present. No oropharyngeal exudate, posterior oropharyngeal edema or tonsillar abscesses.  Eyes: Conjunctivae are normal.  Neck: Normal range of motion. Neck supple.  Cardiovascular: Normal rate, regular rhythm and normal heart sounds.   Pulmonary/Chest: Effort normal and breath sounds normal.  Abdominal: Soft. There is no tenderness.  Musculoskeletal: Normal range of motion.  Neurological: She is alert and oriented to person, place, and time. Gait normal. GCS eye subscore is 4. GCS verbal subscore is 5. GCS motor subscore is 6.  Moves all extremities without difficulty.   Skin: Skin is warm and dry. She is not diaphoretic.  Psychiatric: She  has a normal mood and affect.    ED Course  Procedures  COORDINATION OF CARE:  Nursing notes reviewed. Vital signs reviewed. Initial pt interview and examination performed.   10:57 AM-Discussed treatment plan with pt at bedside. Pt agrees with plan.   Treatment plan initiated: Medications  acetaminophen (TYLENOL) tablet 650 mg (650 mg Oral Given 08/21/13 1104)     Initial diagnostic testing ordered.     Labs Review Labs Reviewed - No data to display Imaging Review No results found.  EKG Interpretation   None       MDM   1. Flu-like symptoms     Filed Vitals:   08/21/13 1005  BP: 117/73  Pulse: 94  Temp: 100.2 F (37.9 C)  Resp: 18    Patient with symptoms consistent with influenza.  Vitals are stable, low-grade fever.  No signs of dehydration, tolerating PO's.  Lungs are clear. Due to patient's presentation and physical exam a chest x-ray was not ordered bc likely diagnosis of flu.  The patient understands that symptoms are within the recommended 24-48 hour window of treatment will write prescription .  Patient will be discharged with instructions to orally hydrate, rest, and use over-the-counter medications such as anti-inflammatories ibuprofen and Aleve for muscle aches and Tylenol for fever.  Return precautions discussed. Patient is agreeable to plan. Patient is stable at time of discharge    I personally performed the services described in this documentation, which was scribed in my presence. The recorded information has been reviewed and is accurate.     Harlow Mares, PA-C 08/21/13 1134

## 2013-08-21 NOTE — ED Provider Notes (Signed)
Medical screening examination/treatment/procedure(s) were performed by non-physician practitioner and as supervising physician I was immediately available for consultation/collaboration.  EKG Interpretation   None         Johm Pfannenstiel B. Draxton Luu, MD 08/21/13 1327 

## 2013-08-21 NOTE — Discharge Instructions (Signed)
Please follow up with your primary care physician in 1-2 days. If you do not have one please call the [facility] number listed above. Please alternate between Motrin and Tylenol every three hours for fevers, aches, and pains. Please take Tamiflu as prescribed. Please read all discharge instructions and return precautions.    Influenza, Adult Influenza ("the flu") is a viral infection of the respiratory tract. It occurs more often in winter months because people spend more time in close contact with one another. Influenza can make you feel very sick. Influenza easily spreads from person to person (contagious). CAUSES  Influenza is caused by a virus that infects the respiratory tract. You can catch the virus by breathing in droplets from an infected person's cough or sneeze. You can also catch the virus by touching something that was recently contaminated with the virus and then touching your mouth, nose, or eyes. SYMPTOMS  Symptoms typically last 4 to 10 days and may include:  Fever.  Chills.  Headache, body aches, and muscle aches.  Sore throat.  Chest discomfort and cough.  Poor appetite.  Weakness or feeling tired.  Dizziness.  Nausea or vomiting. DIAGNOSIS  Diagnosis of influenza is often made based on your history and a physical exam. A nose or throat swab test can be done to confirm the diagnosis. RISKS AND COMPLICATIONS You may be at risk for a more severe case of influenza if you smoke cigarettes, have diabetes, have chronic heart disease (such as heart failure) or lung disease (such as asthma), or if you have a weakened immune system. Elderly people and pregnant women are also at risk for more serious infections. The most common complication of influenza is a lung infection (pneumonia). Sometimes, this complication can require emergency medical care and may be life-threatening. PREVENTION  An annual influenza vaccination (flu shot) is the best way to avoid  getting influenza. An annual flu shot is now routinely recommended for all adults in the [place] TREATMENT  In mild cases, influenza goes away on its own. Treatment is directed at relieving symptoms. For more severe cases, your caregiver may prescribe antiviral medicines to shorten the sickness. Antibiotic medicines are not effective, because the infection is caused by a virus, not by bacteria. HOME CARE INSTRUCTIONS  Only take over-the-counter or prescription medicines for pain, discomfort, or fever as directed by your caregiver.  Use a cool mist humidifier to make breathing easier.  Get plenty of rest until your temperature returns to normal. This usually takes 3 to 4 days.  Drink enough fluids to keep your urine clear or pale yellow.  Cover your mouth and nose when coughing or sneezing, and wash your hands well to avoid spreading the virus.  Stay home from work or school until your fever has been gone for at least 1 full day. SEEK MEDICAL CARE IF:   You have chest pain or a deep cough that worsens or produces more mucus.  You have nausea, vomiting, or diarrhea. SEEK IMMEDIATE MEDICAL CARE IF:   You have difficulty breathing, shortness of breath, or your skin or nails turn bluish.  You have severe neck pain or stiffness.  You have a severe headache, facial pain, or earache.  You have a worsening or recurring fever.  You have nausea or vomiting that cannot be controlled. MAKE SURE YOU:  Understand these instructions.  Will watch your condition.  Will get help right away if you are not doing well or get worse. Document Released:  07/07/2000 Document Revised: 01/09/2012 Document Reviewed: 10/09/2011 Exit[employer] Patient Information 2014 [place].

## 2013-08-21 NOTE — ED Notes (Signed)
Reports having bodyaches, headache, sore throat, fever/chills that started last night. Denies n/v/d. Mask on pt at triage.

## 2014-01-16 ENCOUNTER — Other Ambulatory Visit: Payer: Self-pay

## 2014-01-19 LAB — CYTOLOGY - PAP

## 2014-02-07 ENCOUNTER — Encounter (HOSPITAL_COMMUNITY): Payer: Self-pay | Admitting: *Deleted

## 2014-02-07 ENCOUNTER — Inpatient Hospital Stay (HOSPITAL_COMMUNITY)
Admission: AD | Admit: 2014-02-07 | Discharge: 2014-02-08 | Disposition: A | Discharge status: Home / Self Care | Payer: Medicaid Other | Source: Ambulatory Visit | Attending: Obstetrics and Gynecology | Admitting: Obstetrics and Gynecology

## 2014-02-07 DIAGNOSIS — N949 Unspecified condition associated with female genital organs and menstrual cycle: Secondary | ICD-10-CM | POA: Diagnosis present

## 2014-02-07 DIAGNOSIS — D281 Benign neoplasm of vagina: Secondary | ICD-10-CM | POA: Diagnosis not present

## 2014-02-07 DIAGNOSIS — N9089 Other specified noninflammatory disorders of vulva and perineum: Secondary | ICD-10-CM

## 2014-02-07 DIAGNOSIS — N907 Vulvar cyst: Secondary | ICD-10-CM

## 2014-02-07 DIAGNOSIS — Z87891 Personal history of nicotine dependence: Secondary | ICD-10-CM | POA: Diagnosis not present

## 2014-02-07 LAB — POCT PREGNANCY, URINE: PREG TEST UR: NEGATIVE

## 2014-02-07 NOTE — MAU Note (Signed)
Feels a "lump" inside of her vagina, feels like it's the size of a quarter. First noticed lump 3 weeks ago, has grown & become more painful since then. Denies vaginal bleeding or discharge.

## 2014-02-08 LAB — URINALYSIS, ROUTINE W REFLEX MICROSCOPIC
Bilirubin Urine: NEGATIVE
Glucose, UA: NEGATIVE mg/dL
Hgb urine dipstick: NEGATIVE
Ketones, ur: NEGATIVE mg/dL
LEUKOCYTES UA: NEGATIVE
NITRITE: NEGATIVE
PH: 6 (ref 5.0–8.0)
Protein, ur: NEGATIVE mg/dL
SPECIFIC GRAVITY, URINE: 1.02 (ref 1.005–1.030)
Urobilinogen, UA: 0.2 mg/dL (ref 0.0–1.0)

## 2014-02-08 MED ORDER — DOXYCYCLINE HYCLATE 100 MG PO CAPS: 100.0000 mg | ORAL_CAPSULE | Freq: Two times a day (BID) | ORAL | Status: DC

## 2014-02-08 NOTE — MAU Provider Note (Signed)
History     CSN: [id number]  Arrival date and time: 02/07/14 2327   First Provider Initiated Contact with Patient 02/08/14 0015      Chief Complaint  Patient presents with  . Vaginal Pain   HPI Pt is not pregnant and was seen recently at [facility] for routine GYN and contraception with pap and screening for infections.  Pt states that about 6 days after her visit she noticed a small sore bump on her vagina.  Pt states that is has progressively gotten more painful.  Pt states she has not had this before.  Feels a "lump" inside of her vagina, feels like it's the size of a quarter. First noticed lump 3 weeks ago, has grown & become more painful since then. Denies vaginal bleeding or discharge  Past Medical History  Diagnosis Date  . No pertinent past medical history   . Bacterial vaginosis   . UTI (lower urinary tract infection)   . Chlamydia     x3 , treated   . Abnormal Pap smear 2007    ASC-US     Past Surgical History  Procedure Laterality Date  . Cesarean section      Family History  Problem Relation Age of Onset  . Other Neg Hx   . Hearing loss Neg Hx     History  Substance Use Topics  . Smoking status: Former Smoker -- 1.00 packs/day for 10 years    Types: Cigarettes  . Smokeless tobacco: Never Used     Comment: with + preg  . Alcohol Use: Yes     Comment: social drinker, has been drinking in September - prior to knowing about pregnancy    Allergies: No Known Allergies  Prescriptions prior to admission  Medication Sig Dispense Refill  . desogestrel-ethinyl estradiol (KARIVA,AZURETTE,MIRCETTE) 0.15-0.02/0.01 MG (21/5) tablet Take 1 tablet by mouth daily.      Marland Kitchen acetaminophen (TYLENOL) 325 MG tablet Take 2 tablets (650 mg total) by mouth every 6 (six) hours as needed for mild pain, moderate pain or fever.  30 tablet  0  . ibuprofen (ADVIL,MOTRIN) 800 MG tablet Take 1 tablet (800 mg total) by mouth every 6 (six) hours as needed for fever, mild pain  or moderate pain.  21 tablet  0  . oseltamivir (TAMIFLU) 75 MG capsule Take 1 capsule (75 mg total) by mouth every 12 (twelve) hours.  10 capsule  0    Review of Systems  Constitutional: Negative for fever and chills.  Gastrointestinal: Negative for nausea, vomiting, abdominal pain, diarrhea and constipation.  Genitourinary: Negative for dysuria.   Physical Exam   Blood pressure 124/81, pulse 72, temperature 98.8 F (37.1 C), temperature source Oral, resp. rate 16, height 5\' 5"  (1.651 m), weight 175 lb 12.8 oz (79.742 kg), last menstrual period 01/20/2014, SpO2 100.00%, not currently breastfeeding.  Physical Exam  Nursing note and vitals reviewed. Constitutional: She is oriented to person, place, and time. She appears well-developed and well-nourished. No distress.  HENT:  Head: Normocephalic.  Eyes: Pupils are equal, round, and reactive to light.  Neck: Normal range of motion. Neck supple.  Cardiovascular: Normal rate.   Respiratory: Effort normal.  GI: Soft.  Genitourinary:  Very small ~31mm round symmetrical tender cystic area between labia minora and majora on right side at 11 o'clock position- without erythema or drainage- no ulceration but very painful to touch  Musculoskeletal: Normal range of motion.  Neurological: She is alert and oriented to person, place, and  time.  Skin: Skin is warm and dry.  Psychiatric: She has a normal mood and affect.    MAU Course  Procedures Pt reassured this was an inclusion cyst - not cancer or contagious Advised warm soaks for 20 minutes 3 times daily Because of tenderness/pain- will Rx Doxycycline 100mg  BId for 10 days   Assessment and Plan  Inclusion cyst- warm compresses 75min TID Doxycycline 100mg  BID for 10 days F/u with [facility] if sx persist or worsen  Deshara Rossi 02/08/2014, 12:16 AM

## 2014-03-21 ENCOUNTER — Emergency Department (HOSPITAL_COMMUNITY): Payer: Medicaid Other

## 2014-03-21 ENCOUNTER — Emergency Department (HOSPITAL_COMMUNITY)
Admission: EM | Admit: 2014-03-21 | Discharge: 2014-03-21 | Disposition: A | Discharge status: Home / Self Care | Payer: Medicaid Other | Attending: Emergency Medicine | Admitting: Emergency Medicine

## 2014-03-21 ENCOUNTER — Encounter (HOSPITAL_COMMUNITY): Payer: Self-pay | Admitting: Emergency Medicine

## 2014-03-21 DIAGNOSIS — Z8619 Personal history of other infectious and parasitic diseases: Secondary | ICD-10-CM | POA: Diagnosis not present

## 2014-03-21 DIAGNOSIS — Z87891 Personal history of nicotine dependence: Secondary | ICD-10-CM | POA: Insufficient documentation

## 2014-03-21 DIAGNOSIS — S0993XA Unspecified injury of face, initial encounter: Secondary | ICD-10-CM | POA: Insufficient documentation

## 2014-03-21 DIAGNOSIS — S0990XA Unspecified injury of head, initial encounter: Secondary | ICD-10-CM | POA: Diagnosis not present

## 2014-03-21 DIAGNOSIS — Z3202 Encounter for pregnancy test, result negative: Secondary | ICD-10-CM | POA: Diagnosis not present

## 2014-03-21 DIAGNOSIS — Z87440 Personal history of urinary (tract) infections: Secondary | ICD-10-CM | POA: Insufficient documentation

## 2014-03-21 DIAGNOSIS — R6884 Jaw pain: Secondary | ICD-10-CM

## 2014-03-21 DIAGNOSIS — S199XXA Unspecified injury of neck, initial encounter: Principal | ICD-10-CM

## 2014-03-21 DIAGNOSIS — Z79899 Other long term (current) drug therapy: Secondary | ICD-10-CM | POA: Insufficient documentation

## 2014-03-21 LAB — POC URINE PREG, ED: PREG TEST UR: NEGATIVE

## 2014-03-21 MED ORDER — OXYCODONE-ACETAMINOPHEN 5-325 MG PO TABS: 1.0000 | ORAL_TABLET | Freq: Three times a day (TID) | ORAL | Status: DC | PRN

## 2014-03-21 MED ORDER — IBUPROFEN 400 MG PO TABS: 400.0000 mg | ORAL_TABLET | Freq: Four times a day (QID) | ORAL | Status: DC | PRN

## 2014-03-21 MED ORDER — OXYCODONE-ACETAMINOPHEN 5-325 MG PO TABS
1.0000 | ORAL_TABLET | Freq: Once | ORAL | Status: AC
  Administered 2014-03-21: 1 via ORAL
  Filled 2014-03-21: qty 1

## 2014-03-21 NOTE — Discharge Instructions (Signed)
Please call your doctor for a followup appointment within 24-48 hours. When you talk to your doctor please let them know that you were seen in the emergency department and have them acquire all of your records so that they can discuss the findings with you and formulate a treatment plan to fully care for your new and ongoing problems. Please call and set up an appointment with ear, nose, throat physician Please rest and stay hydrated Please apply ice to the face Please stay hydrated Please take medications as prescribed - while on pain medications there is to be no drinking alcohol, driving, operating any heavy machinery. If extra please dispose in a proper manner. Please do not take any extra Tylenol with this medication for this can lead to Tylenol overdose and liver issues.  Please continue to monitor symptoms closely and if symptoms are to worsen or change (fever greater than 101, chills, sweating, nausea, vomiting, chest pain, shortness of breathe, difficulty breathing, weakness, numbness, tingling, worsening or changes to pain pattern, blurred vision, sudden loss of vision, neck pain, neck stiffness, inability to swallow, fall, injury) please report back to the Emergency Department immediately.    Assault, General Assault includes any behavior, whether intentional or reckless, which results in bodily injury to another person and/or damage to property. Included in this would be any behavior, intentional or reckless, that by its nature would be understood (interpreted) by a reasonable person as intent to harm another person or to damage his/her property. Threats may be oral or written. They may be communicated through regular mail, computer, fax, or phone. These threats may be direct or implied. FORMS OF ASSAULT INCLUDE:  Physically assaulting a person. This includes physical threats to inflict physical harm as well  as:  Slapping.  Hitting.  Poking.  Kicking.  Punching.  Pushing.  Arson.  Sabotage.  Equipment vandalism.  Damaging or destroying property.  Throwing or hitting objects.  Displaying a weapon or an object that appears to be a weapon in a threatening manner.  Carrying a firearm of any kind.  Using a weapon to harm someone.  Using greater physical size/strength to intimidate another.  Making intimidating or threatening gestures.  Bullying.  Hazing.  Intimidating, threatening, hostile, or abusive language directed toward another person.  It communicates the intention to engage in violence against that person. And it leads a reasonable person to expect that violent behavior may occur.  Stalking another person. IF IT HAPPENS AGAIN:  Immediately call for emergency help (911 in [place]  If someone poses clear and immediate danger to you, seek legal authorities to have a protective or restraining order put in place.  Less threatening assaults can at least be reported to authorities. STEPS TO TAKE IF A SEXUAL ASSAULT HAS HAPPENED  Go to an area of safety. This may include a shelter or staying with a friend. Stay away from the area where you have been attacked. A large percentage of sexual assaults are caused by a friend, relative or associate.  If medications were given by your caregiver, take them as directed for the full length of time prescribed.  Only take over-the-counter or prescription medicines for pain, discomfort, or fever as directed by your caregiver.  If you have come in contact with a sexual disease, find out if you are to be tested again. If your caregiver is concerned about the HIV/AIDS virus, he/she may require you to have continued testing for several months.  For the protection of your privacy, test  results can not be given over the phone. Make sure you receive the results of your test. If your test results are not back during your visit, make an  appointment with your caregiver to find out the results. Do not assume everything is normal if you have not heard from your caregiver or the medical facility. It is important for you to follow up on all of your test results.  File appropriate papers with authorities. This is important in all assaults, even if it has occurred in a family or by a friend. SEEK MEDICAL CARE IF:  You have new problems because of your injuries.  You have problems that may be because of the medicine you are taking, such as:  Rash.  Itching.  Swelling.  Trouble breathing.  You develop belly (abdominal) pain, feel sick to your stomach (nausea) or are vomiting.  You begin to run a temperature.  You need supportive care or referral to a rape crisis center. These are centers with trained personnel who can help you get through this ordeal. SEEK IMMEDIATE MEDICAL CARE IF:  You are afraid of being threatened, beaten, or abused. In [place]., call 911.  You receive new injuries related to abuse.  You develop severe pain in any area injured in the assault or have any change in your condition that concerns you.  You faint or lose consciousness.  You develop chest pain or shortness of breath. Document Released: 07/10/2005 Document Revised: 10/02/2011 Document Reviewed: 02/26/2008 [facility] Patient Information 2015 [place], [place]. This information is not intended to replace advice given to you by your health care provider. Make sure you discuss any questions you have with your health care provider.   Emergency Department Resource Guide 1) Find a Doctor and Pay Out of Pocket Although you won't have to find out who is covered by your insurance plan, it is a good idea to ask around and get recommendations. You will then need to call the office and see if the doctor you have chosen will accept you as a new patient and what types of options they offer for patients who are self-pay. Some doctors offer discounts or will set  up payment plans for their patients who do not have insurance, but you will need to ask so you aren't surprised when you get to your appointment.  2) Contact Your Local Health Department Not all health departments have doctors that can see patients for sick visits, but many do, so it is worth a call to see if yours does. If you don't know where your local health department is, you can check in your phone book. The CDC also has a tool to help you locate your state's health department, and many state websites also have listings of all of their local health departments.  3) Find a [facility] If your illness is not likely to be very severe or complicated, you may want to try a walk in clinic. These are popping up all over the country in pharmacies, drugstores, and shopping centers. They're usually staffed by nurse practitioners or physician assistants that have been trained to treat common illnesses and complaints. They're usually fairly quick and inexpensive. However, if you have serious medical issues or chronic medical problems, these are probably not your best option.  No Primary Care Doctor: - Call Health Connect at  [phone] - they can help you locate a primary care doctor that  accepts your insurance, provides certain services, etc. - Physician Referral Service- [phone]  Chronic  Pain Problems: Organization         Address  Phone   Notes  [facility]  [phone] Patients need to be referred by their primary care doctor.   Medication Assistance: Organization         Address  Phone   Notes  [facility] Medication [facility] [place] [place], [place] [address] [phone] --Must be a resident of [facility] -- Must have NO insurance coverage whatsoever (no Medicaid/ Medicare, etc.) -- The pt. MUST have a primary care doctor that directs their care regularly and follows them in the community   MedAssist  [phone]    [employer]  [phone]    Agencies that provide inexpensive medical care: Organization         Address  Phone   Notes  [place]  [phone]   [facility]    [phone]   [facility] [place], [place] [address] [phone]   [place] [address] [place] [phone]   Planned Parenthood    [phone]   [facility]    [phone]   [place] and [address], [place] Phone:  [phone], Fax:  [phone] Hours of Operation:  9 am - 6 pm, M-F.  Also accepts Medicaid/Medicare and self-pay.  [facility] [place], [address], [place] Phone: [phone], Fax: [phone]. Hours of Operation:  8:30 am - 5:30 pm, M-F.  Also accepts Medicaid and self-pay.  [facility] [address] [place] Phone: [phone]   [place], [place], [place] [phone], Ext. [phone] Mondays & Thursdays: 7-9 AM.  First 15 patients are seen on a first come, first serve basis.    [place] Providers:  Organization         Address  Phone   Notes  [facility] [address], [place] [phone] Also accepts self-pay patients.  [facility] [address], [place]  [phone]   [place], [address], [place] [phone]   [facility] [address] [place] [phone]   Lucianne Lei [address], [place]   [phone] Only accepts [place] Access [place] patients after they have their name applied to their card.   Self-Pay (no insurance) in [facility]:  Organization         Address  Phone   Notes  Sickle Cell Patients, [facility] Internal Medicine [place] [phone]   [facility] [place] [phone]   [facility] [place]  [place], [place], [place] [phone]   [facility]/Dr. Osei-Bonsu  [address], [place] or [address], [place] [phone] Phone number for both [place] and [place] locations is the same.  Urgent Medical and [facility] [address] [place] [phone]   [facility] [address], [place] or [address] [phone]   [facility] [address] [place] [phone], phone; [phone], fax Sees patients  1st and 3rd Saturday of every month.  Must not qualify for public or private insurance (i.e. Medicaid, Medicare, [place] Health Choice, Veterans' Benefits)  Household income should be no more than 200% of the poverty level The clinic cannot treat you if you are pregnant or think you are pregnant  Sexually transmitted diseases are not treated at the clinic.    Dental Care: Organization         Address  Phone  Notes  [facility] Department of [facility] [place] [phone] Accepts children up to age 40 who are enrolled in [place] or [place]; pregnant women with a Medicaid card; and children who have applied for Medicaid or [place] Health Choice, but were declined, whose parents can pay a reduced fee at time of service.  [facility] Department of [facility]  [address], [place] [phone] Accepts children up to age 43 who are enrolled in [place] or [place]; pregnant women with a Medicaid card; and children who have applied for Medicaid or [place] Health Choice, but were declined, whose parents can pay a reduced fee at time of service.  [place] Adult Dental Access PROGRAM  [place] [phone] Patients are seen by appointment only. Walk-ins are not accepted. [place] will see patients 50 years of age and older. Monday - Tuesday (8am-5pm) Most Wednesdays (8:30-5pm) $30 per visit, cash only  [facility] Adult Dental Access PROGRAM  [address], [place] [facility] [phone] Patients are seen by appointment only. Walk-ins are not accepted. [place] will see patients 68 years of age and older. One Wednesday Evening (Monthly: Volunteer Based).  $30 per visit, cash only  [place]  [phone] for adults; Children under age 43, call Graduate Pediatric Dentistry at [phone]. Children aged 14-14, please call [phone] to request a pediatric application.  Dental services are provided in all areas of dental care including fillings, crowns and bridges, complete and partial dentures, implants, gum treatment, root canals, and extractions. Preventive care is also provided. Treatment is provided to both adults and children. Patients are selected via a lottery and there is often a waiting list.   [facility] [address], [place]  [phone] [email or web address]   [facility] [address] [place], [place] [phone], Ext. [phone] Second and Fourth Thursday of each month, opens at 6:30 AM; Clinic ends at 9 AM.  Patients are seen on a first-come first-served basis, and a limited number are seen during each clinic.   [facility]  [address] Hillard Danker [place], [place] [phone]   Eligibility Requirements You must have lived in [place], [place], or [place] counties for at least the last three months.   You cannot be eligible for state or federal sponsored [employer], including [employer], [place], or [employer].   You generally cannot be eligible for healthcare insurance through your employer.    How to apply: Eligibility screenings are held every Tuesday and Wednesday afternoon  from 1:00 pm until 4:00 pm. You do not need an appointment for the interview!  [facility] [address], [place], [place]   [place]  [place] Department  [place]  [phone]    Behavioral Health Resources in the Community: Intensive Outpatient Programs Organization  Address  Phone  Notes  [place] [address], [place], [place] [phone]   [facility] Outpatient [address], [place], [place]   ADS: Alcohol & Drug Svcs [address] [place], [place]   [place] [address]  [place], [place] or [phone]   Substance Abuse Resources Organization         Address  Phone  Notes  Alcohol and Drug Services  [phone]   [place]  [phone]   [facility] [place]   Chinita Pester  [phone]   Residential & Outpatient Substance Abuse Program  [phone]   Psychological Services Organization         Address  Phone  Notes  [facility] Roundup  [place]  [phone]   [place] [address] [place] or [phone]    Mobile Crisis Teams Organization         Address  Phone  Notes  [facility], Mobile Crisis Care Unit  [phone]   [facility]  [address] [place], [place]   [place] [address] [place] [phone]    Self-Help/Support Groups Organization         Address  Phone             Notes  [place]. of [place] - variety of support groups  [place] Call for more information  Narcotics Anonymous (NA), [facility] [address], [employer] [place]  2 meetings at this location   Materials engineer         Address  Phone  Notes  ASAP Residential Treatment [place],    [place]  [phone]   [facility]  [address] [place] [phone], [place], [place]   [place], [place] [phone] Admissions: 8am-3pm M-F  Incentives Substance [place] [address],    [place], [place] [phone]   [facility] [address] [place], [place], [place]   [facility] [address]  [place], [place]   [facility]ight Programs - Intensive Outpatient [address], [place], [place]   [facility] ([place].,  [place], [place] [phone] or [phone]   [facility]idential Treatment Services (RTS) [address], [place], [place] Accepts Medicaid  [facility] [place] [address]  [place] [phone] Substance Abuse/Addiction Treatment   [facility] Organization         Address  Phone  Notes  [facility]  [phone]   Domenic Schwab, PhD [address] Arlis Porta [place], [place]   [phone] or [phone]   [place], [place] [phone]   [place] [address] [place], [place] [phone] Insurance/Medicaid/sponsorship through [facility] [address], [address]                                    [place], [place] [phone] [place] [address] [place] [phone]    Dr. Adele Schilder  [phone]   [facility]  [place] Dept. 1) [address], [place] 2) [place] 3)  [place] [address], [place] [phone]  [phone]   [place] [phone] or [phone] (After Hours)
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
## 2014-03-21 NOTE — ED Notes (Signed)
Pt ambulated approximately 100 ft. In hallway. Pt tolerated well.
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
## 2014-03-22 NOTE — ED Provider Notes (Signed)
Medical screening examination/treatment/procedure(s) were performed by non-physician practitioner and as supervising physician I was immediately available for consultation/collaboration.   EKG Interpretation None        [name] Balls, MD 03/22/14 1723

## 2014-04-09 ENCOUNTER — Inpatient Hospital Stay (HOSPITAL_COMMUNITY)
Admission: AD | Admit: 2014-04-09 | Discharge: 2014-04-09 | Disposition: A | Discharge status: Home / Self Care | Payer: Medicaid Other | Source: Ambulatory Visit | Attending: Obstetrics | Admitting: Obstetrics

## 2014-04-09 ENCOUNTER — Encounter (HOSPITAL_COMMUNITY): Payer: Self-pay | Admitting: *Deleted

## 2014-04-09 DIAGNOSIS — A599 Trichomoniasis, unspecified: Secondary | ICD-10-CM | POA: Diagnosis present

## 2014-04-09 DIAGNOSIS — F17200 Nicotine dependence, unspecified, uncomplicated: Secondary | ICD-10-CM | POA: Diagnosis not present

## 2014-04-09 DIAGNOSIS — N390 Urinary tract infection, site not specified: Secondary | ICD-10-CM

## 2014-04-09 DIAGNOSIS — A5901 Trichomonal vulvovaginitis: Secondary | ICD-10-CM | POA: Insufficient documentation

## 2014-04-09 DIAGNOSIS — R109 Unspecified abdominal pain: Secondary | ICD-10-CM | POA: Diagnosis present

## 2014-04-09 LAB — URINE MICROSCOPIC-ADD ON

## 2014-04-09 LAB — CBC
HEMATOCRIT: 39.6 % (ref 36.0–46.0)
HEMOGLOBIN: 13.6 g/dL (ref 12.0–15.0)
MCH: 30.6 pg (ref 26.0–34.0)
MCHC: 34.3 g/dL (ref 30.0–36.0)
MCV: 89.2 fL (ref 78.0–100.0)
Platelets: 228 10*3/uL (ref 150–400)
RBC: 4.44 MIL/uL (ref 3.87–5.11)
RDW: 12.7 % (ref 11.5–15.5)
WBC: 8.4 10*3/uL (ref 4.0–10.5)

## 2014-04-09 LAB — WET PREP, GENITAL
Clue Cells Wet Prep HPF POC: NONE SEEN
YEAST WET PREP: NONE SEEN

## 2014-04-09 LAB — COMPREHENSIVE METABOLIC PANEL
ALBUMIN: 3.8 g/dL (ref 3.5–5.2)
ALK PHOS: 68 U/L (ref 39–117)
ALT: 26 U/L (ref 0–35)
ANION GAP: 12 (ref 5–15)
AST: 22 U/L (ref 0–37)
BUN: 11 mg/dL (ref 6–23)
CHLORIDE: 101 meq/L (ref 96–112)
CO2: 27 meq/L (ref 19–32)
CREATININE: 0.91 mg/dL (ref 0.50–1.10)
Calcium: 9.6 mg/dL (ref 8.4–10.5)
GFR, EST NON AFRICAN AMERICAN: 89 mL/min — AB (ref >90)
GLUCOSE: 91 mg/dL (ref 70–99)
Potassium: 4.1 mEq/L (ref 3.7–5.3)
Sodium: 140 mEq/L (ref 137–147)
Total Protein: 7.1 g/dL (ref 6.0–8.3)

## 2014-04-09 LAB — POCT PREGNANCY, URINE: Preg Test, Ur: NEGATIVE

## 2014-04-09 LAB — URINALYSIS, ROUTINE W REFLEX MICROSCOPIC
Bilirubin Urine: NEGATIVE
Glucose, UA: NEGATIVE mg/dL
Hgb urine dipstick: NEGATIVE
KETONES UR: NEGATIVE mg/dL
NITRITE: NEGATIVE
Protein, ur: NEGATIVE mg/dL
Specific Gravity, Urine: 1.015 (ref 1.005–1.030)
Urobilinogen, UA: 2 mg/dL — ABNORMAL HIGH (ref 0.0–1.0)
pH: 6.5 (ref 5.0–8.0)

## 2014-04-09 MED ORDER — PROMETHAZINE HCL 25 MG PO TABS: 25.0000 mg | ORAL_TABLET | Freq: Four times a day (QID) | ORAL | Status: DC | PRN

## 2014-04-09 MED ORDER — METRONIDAZOLE 500 MG PO TABS: 500.0000 mg | ORAL_TABLET | Freq: Once | ORAL | Status: DC

## 2014-04-09 MED ORDER — CIPROFLOXACIN HCL 500 MG PO TABS: 500.0000 mg | ORAL_TABLET | Freq: Two times a day (BID) | ORAL | Status: DC

## 2014-04-09 MED ORDER — KETOROLAC TROMETHAMINE 60 MG/2ML IM SOLN
60.0000 mg | Freq: Once | INTRAMUSCULAR | Status: AC
  Administered 2014-04-09: 60 mg via INTRAMUSCULAR
  Filled 2014-04-09: qty 2
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
## 2014-04-09 NOTE — Discharge Instructions (Signed)
Urinary Tract Infection Urinary tract infections (UTIs) can develop anywhere along your urinary tract. Your urinary tract is your body's drainage system for removing wastes and extra water. Your urinary tract includes two kidneys, two ureters, a bladder, and a urethra. Your kidneys are a pair of bean-shaped organs. Each kidney is about the size of your fist. They are located below your ribs, one on each side of your spine. CAUSES Infections are caused by microbes, which are microscopic organisms, including fungi, viruses, and bacteria. These organisms are so small that they can only be seen through a microscope. Bacteria are the microbes that most commonly cause UTIs. SYMPTOMS  Symptoms of UTIs may vary by age and gender of the patient and by the location of the infection. Symptoms in young women typically include a frequent and intense urge to urinate and a painful, burning feeling in the bladder or urethra during urination. Older women and men are more likely to be tired, shaky, and weak and have muscle aches and abdominal pain. A fever may mean the infection is in your kidneys. Other symptoms of a kidney infection include pain in your back or sides below the ribs, nausea, and vomiting. DIAGNOSIS To diagnose a UTI, your caregiver will ask you about your symptoms. Your caregiver also will ask to provide a urine sample. The urine sample will be tested for bacteria and white blood cells. White blood cells are made by your body to help fight infection. TREATMENT  Typically, UTIs can be treated with medication. Because most UTIs are caused by a bacterial infection, they usually can be treated with the use of antibiotics. The choice of antibiotic and length of treatment depend on your symptoms and the type of bacteria causing your infection. HOME CARE INSTRUCTIONS  If you were prescribed antibiotics, take them exactly as your caregiver instructs you. Finish the medication even if you feel better after you  have only taken some of the medication.  Drink enough water and fluids to keep your urine clear or pale yellow.  Avoid caffeine, tea, and carbonated beverages. They tend to irritate your bladder.  Empty your bladder often. Avoid holding urine for long periods of time.  Empty your bladder before and after sexual intercourse.  After a bowel movement, women should cleanse from front to back. Use each tissue only once. SEEK MEDICAL CARE IF:   You have back pain.  You develop a fever.  Your symptoms do not begin to resolve within 3 days. SEEK IMMEDIATE MEDICAL CARE IF:   You have severe back pain or lower abdominal pain.  You develop chills.  You have nausea or vomiting.  You have continued burning or discomfort with urination. MAKE SURE YOU:   Understand these instructions.  Will watch your condition.  Will get help right away if you are not doing well or get worse. Document Released: 04/19/2005 Document Revised: 01/09/2012 Document Reviewed: 08/18/2011 [facility] Patient Information 2015 [place], [place]. This information is not intended to replace advice given to you by your health care provider. Make sure you discuss any questions you have with your health care provider. Trichomoniasis Trichomoniasis is an infection caused by an organism called Trichomonas. The infection can affect both women and men. In women, the outer female genitalia and the vagina are affected. In men, the penis is mainly affected, but the prostate and other reproductive organs can also be involved. Trichomoniasis is a sexually transmitted infection (STI) and is most often passed to another person through sexual contact.  RISK FACTORS  Having unprotected sexual intercourse.  Having sexual intercourse with an infected partner. SIGNS AND SYMPTOMS  Symptoms of trichomoniasis in women include:  Abnormal gray-green frothy vaginal discharge.  Itching and irritation of the vagina.  Itching and irritation  of the area outside the vagina. Symptoms of trichomoniasis in men include:   Penile discharge with or without pain.  Pain during urination. This results from inflammation of the urethra. DIAGNOSIS  Trichomoniasis may be found during a Pap test or physical exam. Your health care provider may use one of the following methods to help diagnose this infection:  Examining vaginal discharge under a microscope. For men, urethral discharge would be examined.  Testing the pH of the vagina with a test tape.  Using a vaginal swab test that checks for the Trichomonas organism. A test is available that provides results within a few minutes.  Doing a culture test for the organism. This is not usually needed. TREATMENT   You may be given medicine to fight the infection. Women should inform their health care provider if they could be or are pregnant. Some medicines used to treat the infection should not be taken during pregnancy.  Your health care provider may recommend over-the-counter medicines or creams to decrease itching or irritation.  Your sexual partner will need to be treated if infected. HOME CARE INSTRUCTIONS   Take medicines only as directed by your health care provider.  Take over-the-counter medicine for itching or irritation as directed by your health care provider.  Do not have sexual intercourse while you have the infection.  Women should not douche or wear tampons while they have the infection.  Discuss your infection with your partner. Your partner may have gotten the infection from you, or you may have gotten it from your partner.  Have your sex partner get examined and treated if necessary.  Practice safe, informed, and protected sex.  See your health care provider for other STI testing. SEEK MEDICAL CARE IF:   You still have symptoms after you finish your medicine.  You develop abdominal pain.  You have pain when you urinate.  You have bleeding after sexual  intercourse.  You develop a rash.  Your medicine makes you sick or makes you throw up (vomit). MAKE SURE YOU:  Understand these instructions.  Will watch your condition.  Will get help right away if you are not doing well or get worse. Document Released: 01/03/2001 Document Revised: 11/24/2013 Document Reviewed: 04/21/2013 [facility] Patient Information 2015 [place], [place]. This information is not intended to replace advice given to you by your health care provider. Make sure you discuss any questions you have with your health care provider.

## 2014-04-10 LAB — GC/CHLAMYDIA PROBE AMP
CT Probe RNA: NEGATIVE
GC Probe RNA: NEGATIVE

## 2014-04-10 LAB — HIV ANTIBODY (ROUTINE TESTING W REFLEX): HIV 1&2 Ab, 4th Generation: NONREACTIVE

## 2014-04-11 LAB — URINE CULTURE
Colony Count: NO GROWTH
Culture: NO GROWTH
Special Requests: NORMAL

## 2014-04-22 NOTE — MAU Provider Note (Signed)
Trichomonas, rx flagyl

## 2014-05-25 ENCOUNTER — Encounter (HOSPITAL_COMMUNITY): Payer: Self-pay | Admitting: *Deleted

## 2014-05-31 ENCOUNTER — Inpatient Hospital Stay (HOSPITAL_COMMUNITY)
Admission: AD | Admit: 2014-05-31 | Discharge: 2014-05-31 | Disposition: A | Discharge status: Home / Self Care | Payer: Medicaid Other | Source: Ambulatory Visit | Attending: Obstetrics & Gynecology | Admitting: Obstetrics & Gynecology

## 2014-05-31 ENCOUNTER — Encounter (HOSPITAL_COMMUNITY): Payer: Self-pay | Admitting: *Deleted

## 2014-05-31 DIAGNOSIS — O26891 Other specified pregnancy related conditions, first trimester: Secondary | ICD-10-CM | POA: Insufficient documentation

## 2014-05-31 DIAGNOSIS — Z3A01 Less than 8 weeks gestation of pregnancy: Secondary | ICD-10-CM | POA: Insufficient documentation

## 2014-05-31 DIAGNOSIS — R110 Nausea: Secondary | ICD-10-CM | POA: Insufficient documentation

## 2014-05-31 DIAGNOSIS — O219 Vomiting of pregnancy, unspecified: Secondary | ICD-10-CM

## 2014-05-31 DIAGNOSIS — Z3201 Encounter for pregnancy test, result positive: Secondary | ICD-10-CM | POA: Diagnosis not present

## 2014-05-31 LAB — URINALYSIS, ROUTINE W REFLEX MICROSCOPIC
Bilirubin Urine: NEGATIVE
GLUCOSE, UA: NEGATIVE mg/dL
HGB URINE DIPSTICK: NEGATIVE
Ketones, ur: NEGATIVE mg/dL
Leukocytes, UA: NEGATIVE
NITRITE: NEGATIVE
PH: 6 (ref 5.0–8.0)
PROTEIN: NEGATIVE mg/dL
UROBILINOGEN UA: 0.2 mg/dL (ref 0.0–1.0)

## 2014-05-31 LAB — POCT PREGNANCY, URINE: Preg Test, Ur: POSITIVE — AB

## 2014-05-31 MED ORDER — PRENATAL VITAMINS PLUS 27-1 MG PO TABS: 1.0000 | ORAL_TABLET | Freq: Every day | ORAL | Status: DC

## 2014-05-31 NOTE — Discharge Instructions (Signed)
Your pregnancy test is positive.  No smoking, no drugs, no alcohol.  Take a prenatal vitamin one by mouth every day.  Eat small frequent snacks to avoid nausea.  Begin prenatal care as soon as possible. Drink at least 8 8-oz glasses of water every day. Take Tylenol 325 mg 2 tablets by mouth every 4 hours if needed for pain. Return if you develop abdominal pain, vaginal bleeding or worsening symptoms.

## 2014-05-31 NOTE — MAU Provider Note (Signed)
History     CSN: [id number]  Arrival date and time: 05/31/14 2505   First Provider Initiated Contact with Patient 05/31/14 1932      Chief Complaint  Patient presents with  . Nausea  . Diarrhea  . Abdominal Pain   HPI Courtney Strong 23 y.o. LMP 04-16-14.  Pregnancy test was negative on 04-09-14.  Was on birth control pills but had missed pills and decided to wait until her period came and start over but her period never came.  Has moved recently and lost her pills.  Is very distressed over her positive pregnancy test today.  Was drinking a lot last night she states.   Was having pain on her right side today going from her hip up to her waist.  Periodic today and not severe.    OB History    Gravida Para Term Preterm AB TAB SAB Ectopic Multiple Living   4 2 2  2 1 1   2       Past Medical History  Diagnosis Date  . No pertinent past medical history   . Bacterial vaginosis   . UTI (lower urinary tract infection)   . Chlamydia     x3 , treated   . Abnormal Pap smear 2007    ASC-US     Past Surgical History  Procedure Laterality Date  . Cesarean section      Family History  Problem Relation Age of Onset  . Other Neg Hx   . Hearing loss Neg Hx     History  Substance Use Topics  . Smoking status: Current Every Day Smoker -- 1.00 packs/day for 10 years    Types: Cigarettes  . Smokeless tobacco: Never Used     Comment: with + preg  . Alcohol Use: Yes     Comment: social drinker, has been drinking in September - prior to knowing about pregnancy    Allergies: No Known Allergies  Prescriptions prior to admission  Medication Sig Dispense Refill Last Dose  . ciprofloxacin (CIPRO) 500 MG tablet Take 1 tablet (500 mg total) by mouth 2 (two) times daily. (Patient not taking: Reported on 05/31/2014) 10 tablet 0   . desogestrel-ethinyl estradiol (KARIVA,AZURETTE,MIRCETTE) 0.15-0.02/0.01 MG (21/5) tablet Take 1 tablet by mouth daily.   Past Month at Unknown time  .  ibuprofen (ADVIL,MOTRIN) 200 MG tablet Take 400 mg by mouth every 6 (six) hours as needed for moderate pain.   unknown at unknown  . metroNIDAZOLE (FLAGYL) 500 MG tablet Take 1 tablet (500 mg total) by mouth once. Take 4 tablets for a total of 2 grams (Patient not taking: Reported on 05/31/2014) 4 tablet 0   . promethazine (PHENERGAN) 25 MG tablet Take 1 tablet (25 mg total) by mouth every 6 (six) hours as needed for nausea or vomiting. (Patient not taking: Reported on 05/31/2014) 30 tablet 0     Review of Systems  Constitutional: Negative for fever.  Gastrointestinal: Positive for nausea and diarrhea. Negative for vomiting and abdominal pain.  Genitourinary:       No vaginal discharge. No vaginal bleeding. No dysuria.  Musculoskeletal:       Periodic pain from right hip going to right side of her waist.   Physical Exam   Blood pressure 132/72, pulse 88, temperature 98 F (36.7 C), temperature source Oral, resp. rate 16, height 5\' 5"  (1.651 m), weight 180 lb (81.647 kg), last menstrual period 04/16/2014, not currently breastfeeding.  Physical Exam  Nursing note and  vitals reviewed. Constitutional: She is oriented to person, place, and time. She appears well-developed and well-nourished.  HENT:  Head: Normocephalic.  Eyes: EOM are normal.  Neck: Neck supple.  GI: Soft. There is no tenderness.  Musculoskeletal: Normal range of motion.  Neurological: She is alert and oriented to person, place, and time.  Skin: Skin is warm and dry.  Psychiatric: She has a normal mood and affect.    MAU Course  Procedures  MDM Results for orders placed or performed during the hospital encounter of 05/31/14 (from the past 24 hour(s))  Urinalysis, Routine w reflex microscopic     Status: Abnormal   Collection Time: 05/31/14  7:05 PM  Result Value Ref Range   Color, Urine YELLOW YELLOW   APPearance CLEAR CLEAR   Specific Gravity, Urine >1.030 (H) 1.005 - 1.030   pH 6.0 5.0 - 8.0   Glucose, UA  NEGATIVE NEGATIVE mg/dL   Hgb urine dipstick NEGATIVE NEGATIVE   Bilirubin Urine NEGATIVE NEGATIVE   Ketones, ur NEGATIVE NEGATIVE mg/dL   Protein, ur NEGATIVE NEGATIVE mg/dL   Urobilinogen, UA 0.2 0.0 - 1.0 mg/dL   Nitrite NEGATIVE NEGATIVE   Leukocytes, UA NEGATIVE NEGATIVE  Pregnancy, urine POC     Status: Abnormal   Collection Time: 05/31/14  7:13 PM  Result Value Ref Range   Preg Test, Ur POSITIVE (A) NEGATIVE   Declines further exam at this time.  Assessment and Plan  Nausea in early pregnancy  Plan No vaginal discharge now - resolved from previously when diagnosed with trichomonas Plans to follow up with her medical provider - Esmond Plants. Your pregnancy test is positive.  No smoking, no drugs, no alcohol.  Take a prenatal vitamin one by mouth every day.  Eat small frequent snacks to avoid nausea.  Begin prenatal care as soon as possible. Drink at least 8 8-oz glasses of water every day. Take Tylenol 325 mg 2 tablets by mouth every 4 hours if needed for pain. Return if you develop abdominal pain, vaginal bleeding or worsening symptoms.  Broderick Fonseca 05/31/2014, 7:35 PM

## 2014-05-31 NOTE — MAU Note (Signed)
Patient presents with complaint of nausea, diarrhea and abdominal pain X 2 days.

## 2014-06-01 ENCOUNTER — Inpatient Hospital Stay (HOSPITAL_COMMUNITY)
Admission: AD | Admit: 2014-06-01 | Discharge: 2014-06-01 | Disposition: A | Discharge status: Home / Self Care | Payer: Medicaid Other | Source: Ambulatory Visit | Attending: Family Medicine | Admitting: Family Medicine

## 2014-06-01 ENCOUNTER — Encounter (HOSPITAL_COMMUNITY): Payer: Self-pay

## 2014-06-01 ENCOUNTER — Inpatient Hospital Stay (HOSPITAL_COMMUNITY): Payer: Medicaid Other

## 2014-06-01 DIAGNOSIS — O26899 Other specified pregnancy related conditions, unspecified trimester: Secondary | ICD-10-CM

## 2014-06-01 DIAGNOSIS — O9989 Other specified diseases and conditions complicating pregnancy, childbirth and the puerperium: Secondary | ICD-10-CM | POA: Insufficient documentation

## 2014-06-01 DIAGNOSIS — F17210 Nicotine dependence, cigarettes, uncomplicated: Secondary | ICD-10-CM | POA: Insufficient documentation

## 2014-06-01 DIAGNOSIS — R1030 Lower abdominal pain, unspecified: Secondary | ICD-10-CM | POA: Diagnosis present

## 2014-06-01 DIAGNOSIS — Z3A01 Less than 8 weeks gestation of pregnancy: Secondary | ICD-10-CM | POA: Diagnosis not present

## 2014-06-01 DIAGNOSIS — O99331 Smoking (tobacco) complicating pregnancy, first trimester: Secondary | ICD-10-CM | POA: Diagnosis not present

## 2014-06-01 DIAGNOSIS — R109 Unspecified abdominal pain: Secondary | ICD-10-CM | POA: Diagnosis not present

## 2014-06-01 LAB — WET PREP, GENITAL
Clue Cells Wet Prep HPF POC: NONE SEEN
Trich, Wet Prep: NONE SEEN
YEAST WET PREP: NONE SEEN

## 2014-06-01 LAB — CBC
HCT: 42.9 % (ref 36.0–46.0)
Hemoglobin: 14.8 g/dL (ref 12.0–15.0)
MCH: 30.6 pg (ref 26.0–34.0)
MCHC: 34.5 g/dL (ref 30.0–36.0)
MCV: 88.6 fL (ref 78.0–100.0)
PLATELETS: 272 10*3/uL (ref 150–400)
RBC: 4.84 MIL/uL (ref 3.87–5.11)
RDW: 13.2 % (ref 11.5–15.5)
WBC: 10.2 10*3/uL (ref 4.0–10.5)

## 2014-06-01 LAB — HCG, QUANTITATIVE, PREGNANCY: hCG, Beta Chain, Quant, S: 1156 m[IU]/mL — ABNORMAL HIGH (ref <5)

## 2014-06-01 NOTE — MAU Provider Note (Signed)
Chief Complaint: Vaginal Bleeding   First Provider Initiated Contact with Patient 06/01/14 1741     SUBJECTIVE HPI: Courtney Strong is a 23 y.o. [id number] at [redacted]w[redacted]d by LMP who presents with lower abdominal cramping and sharp pain that began yesterday. Seen in MAU and then had moderate amount of bleeding and clots. Bleeding less today. Also has had diarrhea x 4 days.  A pos Past Medical History  Diagnosis Date  . No pertinent past medical history   . Bacterial vaginosis   . UTI (lower urinary tract infection)   . Chlamydia     x3 , treated   . Abnormal Pap smear 2007    ASC-US    OB History  Gravida Para Term Preterm AB SAB TAB Ectopic Multiple Living  5 2 2  2 1 1   2     # Outcome Date GA Lbr Len/2nd Weight Sex Delivery Anes PTL Lv  5 Current           4 Term 01/19/13 [redacted]w[redacted]d 09:36 / 00:32 3.5 kg (7 lb 11.5 oz) M VBAC EPI  Y  3 SAB 12/2011             Comments: WHOG, no complications  2 TAB 55/7322          1 Term 07/09/09 [redacted]w[redacted]d  2.892 kg (6 lb 6 oz) F CS-LTranv  N Y     Comments: [place], Delivered WHOG, UTi and yeast x1     Past Surgical History  Procedure Laterality Date  . Cesarean section     History   Social History  . Marital Status: Single    Spouse Name: N/A    Number of Children: N/A  . Years of Education: N/A   Occupational History  . Not on file.   Social History Main Topics  . Smoking status: Current Every Day Smoker -- 1.00 packs/day for 10 years    Types: Cigarettes  . Smokeless tobacco: Never Used     Comment: with + preg  . Alcohol Use: Yes     Comment: social drinker, has been drinking in September - prior to knowing about pregnancy  . Drug Use: No     Comment: e-pill in sept.- did not know she was pregnnt  . Sexual Activity: Yes    Birth Control/ Protection: None   Other Topics Concern  . Not on file   Social History Narrative   No current facility-administered medications on file prior to encounter.   Current Outpatient Prescriptions  on File Prior to Encounter  Medication Sig Dispense Refill  . Prenatal Vit-Fe Fumarate-FA (PRENATAL VITAMINS PLUS) 27-1 MG TABS Take 1 tablet by mouth daily. Generic is OK - Prenatal vitamin with 1 mg Folic acid 30 tablet 0  . promethazine (PHENERGAN) 25 MG tablet Take 1 tablet (25 mg total) by mouth every 6 (six) hours as needed for nausea or vomiting. (Patient not taking: Reported on 05/31/2014) 30 tablet 0   No Known Allergies  ROS: Pertinent items in HPI  OBJECTIVE Blood pressure 120/62, pulse 90, temperature 98.5 F (36.9 C), temperature source Oral, resp. rate 16, last menstrual period 04/16/2014, SpO2 100 %, not currently breastfeeding. GENERAL: Well-developed, well-nourished female in no acute distress.  HEENT: Normocephalic HEART: normal rate RESP: normal effort ABDOMEN: Soft, non-tender EXTREMITIES: Nontender, no edema NEURO: Alert and oriented SPECULUM EXAM: NEFG, small amount brown blood noted, cervix clean BIMANUAL: cervix closed; uterus 4-6 wks size, no adnexal tenderness or masses  LAB RESULTS  Results for orders placed or performed during the hospital encounter of 06/01/14 (from the past 24 hour(s))  hCG, quantitative, pregnancy     Status: Abnormal   Collection Time: 06/01/14  5:40 PM  Result Value Ref Range   hCG, Beta Chain, Quant, S 1156 (H) <5 mIU/mL  CBC     Status: None   Collection Time: 06/01/14  5:41 PM  Result Value Ref Range   WBC 10.2 4.0 - 10.5 K/uL   RBC 4.84 3.87 - 5.11 MIL/uL   Hemoglobin 14.8 12.0 - 15.0 g/dL   HCT 42.9 36.0 - 46.0 %   MCV 88.6 78.0 - 100.0 fL   MCH 30.6 26.0 - 34.0 pg   MCHC 34.5 30.0 - 36.0 g/dL   RDW 13.2 11.5 - 15.5 %   Platelets 272 150 - 400 K/uL  Wet prep, genital     Status: Abnormal   Collection Time: 06/01/14  5:50 PM  Result Value Ref Range   Yeast Wet Prep HPF POC NONE SEEN NONE SEEN   Trich, Wet Prep NONE SEEN NONE SEEN   Clue Cells Wet Prep HPF POC NONE SEEN NONE SEEN   WBC, Wet Prep HPF POC FEW (A) NONE SEEN     IMAGING CLINICAL DATA: Abdomen and pelvic pain  EXAM: OBSTETRIC <14 WK [place] AND TRANSVAGINAL OB US  TECHNIQUE: Both transabdominal and transvaginal ultrasound examinations were performed for complete evaluation of the gestation as well as the maternal uterus, adnexal regions, and pelvic cul-de-sac. Transvaginal technique was performed to assess early pregnancy.  COMPARISON: None.  FINDINGS: Intrauterine gestational sac: Visualized/normal in shape.  Yolk sac: Not visualized  Embryo: Not visualized  MSD: 3 mm 4 w 6 d  Maternal uterus/adnexae: There is no demonstrable subchorionic hemorrhage. Cervical os is closed. There is a 4 x 3 mm cystic area in the myometrium on the right. There are no extrauterine pelvic or adnexal masses beyond either a dominant follicle or corpus luteum in the right ovary measuring 1.8 x 1.2 cm. There is no free pelvic fluid.  IMPRESSION: Probable early intrauterine gestational sac, but no yolk sac, fetal pole, or cardiac activity yet visualized. Recommend follow-up quantitative B-HCG levels and follow-up US in 14 days to confirm and assess viability. This recommendation follows SRU consensus guidelines: Diagnostic Criteria for Nonviable Pregnancy Early in the First Trimester. [employer] Med 2013; 301:6010-93.   Electronically Signed  By: Lowella Grip M.D.  On: 06/01/2014 19:43  MAU COURSE  ASSESSMENT 1. Abdominal pain affecting pregnancy   Pregnancy unknown location  PLAN Discharge home with ectopic precautions    Medication List    TAKE these medications        PRENATAL VITAMINS PLUS 27-1 MG Tabs  Take 1 tablet by mouth daily. Generic is OK - Prenatal vitamin with 1 mg Folic acid     promethazine 25 MG tablet  Commonly known as:  PHENERGAN  Take 1 tablet (25 mg total) by mouth every 6 (six) hours as needed for nausea or vomiting.       Follow-up Information    Follow up with Nursepractioner Mau,  NP In 2 days.      Lorene Dy, CNM 06/01/2014  7:40 PM

## 2014-06-01 NOTE — MAU Note (Signed)
Urine in lab 

## 2014-06-01 NOTE — MAU Note (Signed)
Patient states she started bleeding last night after her visit to MAU. States it was heavy. States she had been having abdominal pain but the pain has stopped. Nausea, no vomiting.

## 2014-06-01 NOTE — MAU Note (Signed)
Pt was seen in MAU last night for abd pain, +UPT.  Started bleeding after she left MAU, passed a large clot in the toilet. Having a lot of cramping. Concerned she had a miscarriage.

## 2014-06-01 NOTE — Discharge Instructions (Signed)
Abdominal Pain During Pregnancy °Abdominal pain is common in pregnancy. Most of the time, it does not cause harm. There are many causes of abdominal pain. Some causes are more serious than others. Some of the causes of abdominal pain in pregnancy are easily diagnosed. Occasionally, the diagnosis takes time to understand. Other times, the cause is not determined. Abdominal pain can be a sign that something is very wrong with the pregnancy, or the pain may have nothing to do with the pregnancy at all. For this reason, always tell your health care provider if you have any abdominal discomfort. °HOME CARE INSTRUCTIONS  °Monitor your abdominal pain for any changes. The following actions may help to alleviate any discomfort you are experiencing: °· Do not have sexual intercourse or put anything in your vagina until your symptoms go away completely. °· Get plenty of rest until your pain improves. °· Drink clear fluids if you feel nauseous. Avoid solid food as long as you are uncomfortable or nauseous. °· Only take over-the-counter or prescription medicine as directed by your health care provider. °· Keep all follow-up appointments with your health care provider. °SEEK IMMEDIATE MEDICAL CARE IF: °· You are bleeding, leaking fluid, or passing tissue from the vagina. °· You have increasing pain or cramping. °· You have persistent vomiting. °· You have painful or bloody urination. °· You have a fever. °· You notice a decrease in your baby's movements. °· You have extreme weakness or feel faint. °· You have shortness of breath, with or without abdominal pain. °· You develop a severe headache with abdominal pain. °· You have abnormal vaginal discharge with abdominal pain. °· You have persistent diarrhea. °· You have abdominal pain that continues even after rest, or gets worse. °MAKE SURE YOU:  °· Understand these instructions. °· Will watch your condition. °· Will get help right away if you are not doing well or get  worse. °Document Released: 07/10/2005 Document Revised: 04/30/2013 Document Reviewed: 02/06/2013 °ExitCare® Patient Information ©2015 [employer]. This information is not intended to replace advice given to you by your health care provider. Make sure you discuss any questions you have with your health care provider. °Ectopic Pregnancy °An ectopic pregnancy is when the fertilized egg attaches (implants) outside the uterus. Most ectopic pregnancies occur in the fallopian tube. Rarely do ectopic pregnancies occur on the ovary, intestine, pelvis, or cervix. In an ectopic pregnancy, the fertilized egg does not have the ability to develop into a normal, healthy baby.  °A ruptured ectopic pregnancy is one in which the fallopian tube gets torn or bursts and results in internal bleeding. Often there is intense abdominal pain, and sometimes, vaginal bleeding. Having an ectopic pregnancy can be life threatening. If left untreated, this dangerous condition can lead to a blood transfusion, abdominal surgery, or even death. °CAUSES  °Damage to the fallopian tubes is the suspected cause in most ectopic pregnancies.  °RISK FACTORS °Depending on your circumstances, the risk of having an ectopic pregnancy will vary. The level of risk can be divided into three categories. °High Risk °· You have gone through infertility treatment. °· You have had a previous ectopic pregnancy. °· You have had previous tubal surgery. °· You have had previous surgery to have the fallopian tubes tied (tubal ligation). °· You have tubal problems or diseases. °· You have been exposed to DES. DES is a medicine that was used until 1971 and had effects on babies whose mothers took the medicine. °· You become pregnant while using   an intrauterine device (IUD) for birth control.  °Moderate Risk °· You have a history of infertility. °· You have a history of a sexually transmitted infection (STI). °· You have a history of pelvic inflammatory disease (PID). °· You  have scarring from endometriosis. °· You have multiple sexual partners. °· You smoke.  °Low Risk °· You have had previous pelvic surgery. °· You use vaginal douching. °· You became sexually active before 22 years of age. °SIGNS AND SYMPTOMS  °An ectopic pregnancy should be suspected in anyone who has missed a period and has abdominal pain or bleeding. °· You may experience normal pregnancy symptoms, such as: °¨ Nausea. °¨ Tiredness. °¨ Breast tenderness. °· Other symptoms may include: °¨ Pain with intercourse. °¨ Irregular vaginal bleeding or spotting. °¨ Cramping or pain on one side or in the lower abdomen. °¨ Fast heartbeat. °¨ Passing out while having a bowel movement. °· Symptoms of a ruptured ectopic pregnancy and internal bleeding may include: °¨ Sudden, severe pain in the abdomen and pelvis. °¨ Dizziness or fainting. °¨ Pain in the shoulder area. °DIAGNOSIS  °Tests that may be performed include: °· A pregnancy test. °· An ultrasound test. °· Testing the specific level of pregnancy hormone in the bloodstream. °· Taking a sample of uterus tissue (dilation and curettage, D&C). °· Surgery to perform a visual exam of the inside of the abdomen using a thin, lighted tube with a tiny camera on the end (laparoscope). °TREATMENT  °An injection of a medicine called methotrexate may be given. This medicine causes the pregnancy tissue to be absorbed. It is given if: °· The diagnosis is made early. °· The fallopian tube has not ruptured. °· You are considered to be a good candidate for the medicine. °Usually, pregnancy hormone blood levels are checked after methotrexate treatment. This is to be sure the medicine is effective. It may take 4-6 weeks for the pregnancy to be absorbed (though most pregnancies will be absorbed by 3 weeks). °Surgical treatment may be needed. A laparoscope may be used to remove the pregnancy tissue. If severe internal bleeding occurs, a cut (incision) may be made in the lower abdomen (laparotomy),  and the ectopic pregnancy is removed. This stops the bleeding. Part of the fallopian tube, or the whole tube, may be removed as well (salpingectomy). After surgery, pregnancy hormone tests may be done to be sure there is no pregnancy tissue left. You may receive a Rho (D) immune globulin shot if you are Rh negative and the father is Rh positive, or if you do not know the Rh type of the father. This is to prevent problems with any future pregnancy. °SEEK IMMEDIATE MEDICAL CARE IF:  °You have any symptoms of an ectopic pregnancy. This is a medical emergency. °MAKE SURE YOU: °· Understand these instructions. °· Will watch your condition. °· Will get help right away if you are not doing well or get worse. °Document Released: 08/17/2004 Document Revised: 11/24/2013 Document Reviewed: 02/06/2013 °ExitCare® Patient Information ©2015 [employer]. This information is not intended to replace advice given to you by your health care provider. Make sure you discuss any questions you have with your health care provider. ° °

## 2014-06-02 ENCOUNTER — Telehealth: Payer: Self-pay | Admitting: General Practice

## 2014-06-02 DIAGNOSIS — O98811 Other maternal infectious and parasitic diseases complicating pregnancy, first trimester: Principal | ICD-10-CM

## 2014-06-02 DIAGNOSIS — A749 Chlamydial infection, unspecified: Secondary | ICD-10-CM

## 2014-06-02 LAB — GC/CHLAMYDIA PROBE AMP
CT Probe RNA: POSITIVE — AB
GC PROBE AMP APTIMA: NEGATIVE

## 2014-06-02 LAB — HIV ANTIBODY (ROUTINE TESTING W REFLEX): HIV: NONREACTIVE

## 2014-06-02 MED ORDER — AZITHROMYCIN 250 MG PO TABS: 1000.0000 mg | ORAL_TABLET | Freq: Once | ORAL | Status: DC

## 2014-06-02 NOTE — Telephone Encounter (Signed)
Med ordered. Called patient and informed her of medication sent to pharmacy. Patient verbalized understanding and had no questions

## 2014-06-02 NOTE — Telephone Encounter (Signed)
-----   Message from Tarry Kos sent at 06/02/2014  9:24 AM EST ----- Telephone call to patient regarding positive chlamydia culture, patient notified.  Patient has not been treated and will need Rx called in per protocol to [facility].  Instructed patient to notify her partner for treatment.  Report faxed to health department.

## 2014-06-03 ENCOUNTER — Inpatient Hospital Stay (HOSPITAL_COMMUNITY)
Admission: AD | Admit: 2014-06-03 | Discharge: 2014-06-03 | Disposition: A | Discharge status: Home / Self Care | Payer: Medicaid Other | Source: Ambulatory Visit | Attending: Obstetrics and Gynecology | Admitting: Obstetrics and Gynecology

## 2014-06-03 ENCOUNTER — Encounter (HOSPITAL_COMMUNITY): Payer: Self-pay | Admitting: Obstetrics and Gynecology

## 2014-06-03 DIAGNOSIS — O209 Hemorrhage in early pregnancy, unspecified: Secondary | ICD-10-CM

## 2014-06-03 DIAGNOSIS — O4691 Antepartum hemorrhage, unspecified, first trimester: Secondary | ICD-10-CM

## 2014-06-03 DIAGNOSIS — Z3A01 Less than 8 weeks gestation of pregnancy: Secondary | ICD-10-CM | POA: Diagnosis not present

## 2014-06-03 DIAGNOSIS — O200 Threatened abortion: Secondary | ICD-10-CM | POA: Insufficient documentation

## 2014-06-03 LAB — HCG, QUANTITATIVE, PREGNANCY: HCG, BETA CHAIN, QUANT, S: 2158 m[IU]/mL — AB (ref <5)

## 2014-06-03 NOTE — MAU Provider Note (Signed)
Subjective:  Ms. Courtney Strong is a 23 y.o. female [phone] at [redacted]w[redacted]d who presents for a follow up beta hcg level. She was seen 2 days ago for bleeding and cramping; she continues to have very mild cramping, bleeding has slowed tremendously. [place] two days ago showed possible IUGS.  Objective:  GENERAL: Well-developed, well-nourished female in no acute distress.  HEENT: Normocephalic, atraumatic.   LUNGS: Effort normal HEART: Regular rate  SKIN: Warm, dry and without erythema PSYCH: Normal mood and affect  Filed Vitals:   06/03/14 1101  BP: 114/49  Pulse: 81  Temp: 98.6 F (37 C)  Resp: 16   Results for orders placed or performed during the hospital encounter of 06/03/14 (from the past 48 hour(s))  hCG, quantitative, pregnancy     Status: Abnormal   Collection Time: 06/03/14 10:50 AM  Result Value Ref Range   hCG, Beta Chain, Quant, S 2158 (H) <5 mIU/mL    Comment:          GEST. AGE      CONC.  (mIU/mL)   <=1 WEEK        5 - 50     2 WEEKS       50 - 500     3 WEEKS       100 - 10,000     4 WEEKS     1,000 - 30,000     5 WEEKS     3,500 - 115,000   6-8 WEEKS     12,000 - 270,000    12 WEEKS     15,000 - 220,000        FEMALE AND NON-PREGNANT FEMALE:     LESS THAN 5 mIU/mL       MDM: Quant 06/01/2014: 8185 Quant 06/03/2014 2158 >60% rise in beta hcg level in 48 hours.  A positive blood type    Assessment:  1. Vaginal bleeding in pregnancy, first trimester   2.     Threatened miscarriage 3.      Pregnancy of unknown location cannot rule out ectopic pregnancy   Plan: Discharge home in stable condition Follow up ultrasound in 7 days; [place] will call to schedule Bleeding precautions Pelvic rest Return to MAU if symptoms worsen     Darrelyn Hillock Rasch, NP 06/03/2014 11:29 AM

## 2014-06-03 NOTE — MAU Note (Signed)
Patient to MAU for repeat BHCG. Denies bleeding but continues to have mild cramping.

## 2014-06-03 NOTE — Discharge Instructions (Signed)
Pelvic Rest °Pelvic rest is sometimes recommended for women when:  °· The placenta is partially or completely covering the opening of the cervix (placenta previa). °· There is bleeding between the uterine wall and the amniotic sac in the first trimester (subchorionic hemorrhage). °· The cervix begins to open without labor starting (incompetent cervix, cervical insufficiency). °· The labor is too early (preterm labor). °HOME CARE INSTRUCTIONS °· Do not have sexual intercourse, stimulation, or an orgasm. °· Do not use tampons, douche, or put anything in the vagina. °· Do not lift anything over 10 pounds (4.5 kg). °· Avoid strenuous activity or straining your pelvic muscles. °SEEK MEDICAL CARE IF:  °· You have any vaginal bleeding during pregnancy. Treat this as a potential emergency. °· You have cramping pain felt low in the stomach (stronger than menstrual cramps). °· You notice vaginal discharge (watery, mucus, or bloody). °· You have a low, dull backache. °· There are regular contractions or uterine tightening. °SEEK IMMEDIATE MEDICAL CARE IF: °You have vaginal bleeding and have placenta previa.  °Document Released: 11/04/2010 Document Revised: 10/02/2011 Document Reviewed: 11/04/2010 °ExitCare® Patient Information ©2015 [employer]. This information is not intended to replace advice given to you by your health care provider. Make sure you discuss any questions you have with your health care provider. ° °Vaginal Bleeding During Pregnancy, First Trimester °A small amount of bleeding (spotting) from the vagina is common in early pregnancy. Sometimes the bleeding is normal and is not a problem, and sometimes it is a sign of something serious. Be sure to tell your doctor about any bleeding from your vagina right away. °HOME CARE °· Watch your condition for any changes. °· Follow your doctor's instructions about how active you can be. °· If you are on bed rest: °¨ You may need to stay in bed and only get up to use the  bathroom. °¨ You may be allowed to do some activities. °¨ If you need help, make plans for someone to help you. °· Write down: °¨ The number of pads you use each day. °¨ How often you change pads. °¨ How soaked (saturated) your pads are. °· Do not use tampons. °· Do not douche. °· Do not have sex or orgasms until your doctor says it is okay. °· If you pass any tissue from your vagina, save the tissue so you can show it to your doctor. °· Only take medicines as told by your doctor. °· Do not take aspirin because it can make you bleed. °· Keep all follow-up visits as told by your doctor. °GET HELP IF:  °· You bleed from your vagina. °· You have cramps. °· You have labor pains. °· You have a fever that does not go away after you take medicine. °GET HELP RIGHT AWAY IF:  °· You have very bad cramps in your back or belly (abdomen). °· You pass large clots or tissue from your vagina. °· You bleed more. °· You feel light-headed or weak. °· You pass out (faint). °· You have chills. °· You are leaking fluid or have a gush of fluid from your vagina. °· You pass out while pooping (having a bowel movement). °MAKE SURE YOU: °· Understand these instructions. °· Will watch your condition. °· Will get help right away if you are not doing well or get worse. °Document Released: 11/24/2013 Document Reviewed: 03/17/2013 °ExitCare® Patient Information ©2015 [employer]. This information is not intended to replace advice given to you by your health care provider. Make   sure you discuss any questions you have with your health care provider.

## 2014-06-09 ENCOUNTER — Inpatient Hospital Stay (HOSPITAL_COMMUNITY)
Admission: AD | Admit: 2014-06-09 | Discharge: 2014-06-10 | Disposition: A | Discharge status: Home / Self Care | Payer: Medicaid Other | Source: Ambulatory Visit | Attending: Obstetrics & Gynecology | Admitting: Obstetrics & Gynecology

## 2014-06-09 ENCOUNTER — Encounter (HOSPITAL_COMMUNITY): Payer: Self-pay | Admitting: *Deleted

## 2014-06-09 ENCOUNTER — Inpatient Hospital Stay (HOSPITAL_COMMUNITY): Payer: Medicaid Other

## 2014-06-09 DIAGNOSIS — Z3A01 Less than 8 weeks gestation of pregnancy: Secondary | ICD-10-CM | POA: Insufficient documentation

## 2014-06-09 DIAGNOSIS — O209 Hemorrhage in early pregnancy, unspecified: Secondary | ICD-10-CM | POA: Insufficient documentation

## 2014-06-09 DIAGNOSIS — F17210 Nicotine dependence, cigarettes, uncomplicated: Secondary | ICD-10-CM | POA: Insufficient documentation

## 2014-06-09 DIAGNOSIS — R1030 Lower abdominal pain, unspecified: Secondary | ICD-10-CM | POA: Diagnosis present

## 2014-06-09 NOTE — MAU Note (Signed)
PT SAYS SHE WAS HERE  11-12-   FOR VAG BLEEDING.-  DREW LABS  AND U/S. SHE CAME BACK  SAT  FOR RESULTS.-  LEVEL[facility]   [facility]-.  IS [place] FOR U/S   TOMORROW.     YESTERDAY- SPOTTING-   LAST NIGHT - NO BLEEDING.   THEN THIS  AM   - SPOTTING.  THEN  AT  930PM-   BLEEDING.-  NO PAD  IN TRIAGE     SMALL STREAKS  IN UNDERWEAR. Marland Kitchen    CRAMPING  BECAME WORSE   AFTERNOON.

## 2014-06-10 ENCOUNTER — Inpatient Hospital Stay (EMERGENCY_DEPARTMENT_HOSPITAL)
Admission: AD | Admit: 2014-06-10 | Discharge: 2014-06-10 | Disposition: A | Discharge status: Home / Self Care | Payer: Medicaid Other | Source: Ambulatory Visit | Attending: Obstetrics and Gynecology | Admitting: Obstetrics and Gynecology

## 2014-06-10 ENCOUNTER — Ambulatory Visit (HOSPITAL_COMMUNITY): Payer: Medicaid Other

## 2014-06-10 DIAGNOSIS — O209 Hemorrhage in early pregnancy, unspecified: Secondary | ICD-10-CM

## 2014-06-10 DIAGNOSIS — O039 Complete or unspecified spontaneous abortion without complication: Secondary | ICD-10-CM

## 2014-06-10 LAB — CBC WITH DIFFERENTIAL/PLATELET
Basophils Absolute: 0 10*3/uL (ref 0.0–0.1)
Basophils Relative: 0 % (ref 0–1)
Eosinophils Absolute: 0.2 10*3/uL (ref 0.0–0.7)
Eosinophils Relative: 2 % (ref 0–5)
HCT: 40.7 % (ref 36.0–46.0)
HEMOGLOBIN: 13.8 g/dL (ref 12.0–15.0)
LYMPHS ABS: 3.5 10*3/uL (ref 0.7–4.0)
Lymphocytes Relative: 38 % (ref 12–46)
MCH: 30.5 pg (ref 26.0–34.0)
MCHC: 33.9 g/dL (ref 30.0–36.0)
MCV: 90 fL (ref 78.0–100.0)
Monocytes Absolute: 0.6 10*3/uL (ref 0.1–1.0)
Monocytes Relative: 7 % (ref 3–12)
NEUTROS ABS: 4.7 10*3/uL (ref 1.7–7.7)
NEUTROS PCT: 53 % (ref 43–77)
Platelets: 241 10*3/uL (ref 150–400)
RBC: 4.52 MIL/uL (ref 3.87–5.11)
RDW: 13 % (ref 11.5–15.5)
WBC: 9.1 10*3/uL (ref 4.0–10.5)

## 2014-06-10 LAB — HCG, QUANTITATIVE, PREGNANCY: hCG, Beta Chain, Quant, S: 1041 m[IU]/mL — ABNORMAL HIGH (ref <5)

## 2014-06-10 MED ORDER — OXYCODONE-ACETAMINOPHEN 5-325 MG PO TABS: 1.0000 | ORAL_TABLET | ORAL | Status: DC | PRN

## 2014-06-10 NOTE — Discharge Instructions (Signed)
Threatened Miscarriage  A threatened miscarriage is when you have vaginal bleeding during your first 20 weeks of pregnancy but the pregnancy has not ended. Your doctor will do tests to make sure you are still pregnant. The cause of the bleeding may not be known. This condition does not mean your pregnancy will end. It does increase the risk of it ending (complete miscarriage).  HOME CARE   · Make sure you keep all your doctor visits for prenatal care.  · Get plenty of rest.  · Do not have sex or use tampons if you have vaginal bleeding.  · Do not douche.  · Do not smoke or use drugs.  · Do not drink alcohol.  · Avoid caffeine.  GET HELP IF:  · You have light bleeding from your vagina.  · You have belly pain or cramping.  · You have a fever.  GET HELP RIGHT AWAY IF:   · You have heavy bleeding from your vagina.  · You have clots of blood coming from your vagina.  · You have bad pain or cramps in your low back or belly.  · You have fever, chills, and bad belly pain.  MAKE SURE YOU:   · Understand these instructions.  · Will watch your condition.  · Will get help right away if you are not doing well or get worse.  Document Released: 06/22/2008 Document Revised: 07/15/2013 Document Reviewed: 05/06/2013  ExitCare® Patient Information ©2015 [employer]. This information is not intended to replace advice given to you by your health care provider. Make sure you discuss any questions you have with your health care provider.    Pelvic Rest  Pelvic rest is sometimes recommended for women when:   · The placenta is partially or completely covering the opening of the cervix (placenta previa).  · There is bleeding between the uterine wall and the amniotic sac in the first trimester (subchorionic hemorrhage).  · The cervix begins to open without labor starting (incompetent cervix, cervical insufficiency).  · The labor is too early (preterm labor).  HOME CARE INSTRUCTIONS  · Do not have sexual intercourse, stimulation, or an  orgasm.  · Do not use tampons, douche, or put anything in the vagina.  · Do not lift anything over 10 pounds (4.5 kg).  · Avoid strenuous activity or straining your pelvic muscles.  SEEK MEDICAL CARE IF:   · You have any vaginal bleeding during pregnancy. Treat this as a potential emergency.  · You have cramping pain felt low in the stomach (stronger than menstrual cramps).  · You notice vaginal discharge (watery, mucus, or bloody).  · You have a low, dull backache.  · There are regular contractions or uterine tightening.  SEEK IMMEDIATE MEDICAL CARE IF:  You have vaginal bleeding and have placenta previa.   Document Released: 11/04/2010 Document Revised: 10/02/2011 Document Reviewed: 11/04/2010  ExitCare® Patient Information ©2015 [employer]. This information is not intended to replace advice given to you by your health care provider. Make sure you discuss any questions you have with your health care provider.

## 2014-06-10 NOTE — MAU Provider Note (Signed)
Ms. ANEEKA BOWDEN is a 23 y.o. [id number] at [redacted]w[redacted]d who presents to MAU today with complaint of increased bleeding and passing a small clot. Patient is concerned that this may be POC. She has specimen with her. She states pain has improved and bleeding is now approximately the same as yesterday. Patient was seen last night and had multiple poor prognostic signs of [place], but we could not definitively state SAB at that time. Patient denies pain now and has not required Percocet given yesterday.   BP 127/73 mmHg  Pulse 74  Temp(Src) 98 F (36.7 C) (Oral)  Resp 18  LMP 04/16/2014 GENERAL: Well-developed, well-nourished female in no acute distress.  HEENT: Normocephalic, atraumatic.   LUNGS: Effort normal HEART: Regular rate  SKIN: Warm, dry and without erythema PSYCH: Normal mood and affect  Results for orders placed or performed during the hospital encounter of 06/10/14 (from the past 24 hour(s))  CBC with Differential     Status: None   Collection Time: 06/10/14  9:11 PM  Result Value Ref Range   WBC 9.1 4.0 - 10.5 K/uL   RBC 4.52 3.87 - 5.11 MIL/uL   Hemoglobin 13.8 12.0 - 15.0 g/dL   HCT 40.7 36.0 - 46.0 %   MCV 90.0 78.0 - 100.0 fL   MCH 30.5 26.0 - 34.0 pg   MCHC 33.9 30.0 - 36.0 g/dL   RDW 13.0 11.5 - 15.5 %   Platelets 241 150 - 400 K/uL   Neutrophils Relative % 53 43 - 77 %   Neutro Abs 4.7 1.7 - 7.7 K/uL   Lymphocytes Relative 38 12 - 46 %   Lymphs Abs 3.5 0.7 - 4.0 K/uL   Monocytes Relative 7 3 - 12 %   Monocytes Absolute 0.6 0.1 - 1.0 K/uL   Eosinophils Relative 2 0 - 5 %   Eosinophils Absolute 0.2 0.0 - 0.7 K/uL   Basophils Relative 0 0 - 1 %   Basophils Absolute 0.0 0.0 - 0.1 K/uL  hCG, quantitative, pregnancy     Status: Abnormal   Collection Time: 06/10/14  9:11 PM  Result Value Ref Range   hCG, Beta Chain, Quant, S 1041 (H) <5 mIU/mL    MDM Quant hCG has decreased at this time. SAB in progress.  Patient is stable.  Discussed option of Cytotec vs expectant  management. Given patient is currently bleeding and passed possible POC, she has elected for expectant management at this time.   A: SAB  P: Discharge home Bleeding precautions and warning signs for infection discussed Patient referred to [facility] for follow-up in ~ 2 weeks. They will call her with an appointment Patient may return to MAU as needed or if her condition were to change or worsen   Luvenia Redden, PA-C 06/10/2014 10:15 PM

## 2014-06-10 NOTE — Discharge Instructions (Signed)
Incomplete Miscarriage A miscarriage is the sudden loss of an unborn baby (fetus) before the 20th week of pregnancy. In an incomplete miscarriage, parts of the fetus or placenta (afterbirth) remain in the body.  Having a miscarriage can be an emotional experience. Talk with your health care provider about any questions you may have about miscarrying, the grieving process, and your future pregnancy plans. CAUSES   Problems with the fetal chromosomes that make it impossible for the baby to develop normally. Problems with the baby's genes or chromosomes are most often the result of errors that occur by chance as the embryo divides and grows. The problems are not inherited from the parents.  Infection of the cervix or uterus.  Hormone problems.  Problems with the cervix, such as having an incompetent cervix. This is when the tissue in the cervix is not strong enough to hold the pregnancy.  Problems with the uterus, such as an abnormally shaped uterus, uterine fibroids, or congenital abnormalities.  Certain medical conditions.  Smoking, drinking alcohol, or taking illegal drugs.  Trauma. SYMPTOMS   Vaginal bleeding or spotting, with or without cramps or pain.  Pain or cramping in the abdomen or lower back.  Passing fluid, tissue, or blood clots from the vagina. DIAGNOSIS  Your health care provider will perform a physical exam. You may also have an ultrasound to confirm the miscarriage. Blood or urine tests may also be ordered. TREATMENT   Usually, a dilation and curettage (D&C) procedure is performed. During a D&C procedure, the cervix is widened (dilated) and any remaining fetal or placental tissue is gently removed from the uterus.  Antibiotic medicines are prescribed if there is an infection. Other medicines may be given to reduce the size of the uterus (contract) if there is a lot of bleeding.  If you have Rh negative blood and your baby was Rh positive, you will need a Rho (D)  immune globulin shot. This shot will protect any future baby from having Rh blood problems in future pregnancies.  You may be confined to bed rest. This means you should stay in bed and only get up to use the bathroom. HOME CARE INSTRUCTIONS   Rest as directed by your health care provider.  Restrict activity as directed by your health care provider. You may be allowed to continue light activity if curettage was not done but you require further treatment.  Keep track of the number of pads you use each day. Keep track of how soaked (saturated) they are. Record this information.  Do not  use tampons.  Do not douche or have sexual intercourse until approved by your health care provider.  Keep all follow-up appointments for reevaluation and continuing management.  Only take over-the-counter or prescription medicines for pain, fever, or discomfort as directed by your health care provider.  Take antibiotic medicine as directed by your health care provider. Make sure you finish it even if you start to feel better. SEEK IMMEDIATE MEDICAL CARE IF:   You experience severe cramps in your stomach, back, or abdomen.  You have an unexplained temperature (make sure to record these temperatures).  You pass large clots or tissue (save these for your health care provider to inspect).  Your bleeding increases.  You become light-headed, weak, or have fainting episodes. MAKE SURE YOU:   Understand these instructions.  Will watch your condition.  Will get help right away if you are not doing well or get worse. Document Released: 07/10/2005 Document Revised: 11/24/2013 Document Reviewed:   02/06/2013 ExitCare Patient Information 2015 [employer]. This information is not intended to replace advice given to you by your health care provider. Make sure you discuss any questions you have with your health care provider.  

## 2014-06-10 NOTE — MAU Note (Signed)
Pt presents complaining of heavy vaginal bleeding with clots and severe cramping that has gotten worse since she was seen in MAU yesterday. Cramping has since stopped, but bleeding is still continuing.

## 2014-06-10 NOTE — MAU Provider Note (Signed)
History     CSN: [id number]  Arrival date and time: 06/09/14 2229   First Provider Initiated Contact with Patient 06/10/14 0108      No chief complaint on file.  HPI  Ms. Courtney Strong is a 23 y.o. [id number] at [redacted]w[redacted]d who presents to MAU today with complaint of increased lower abdominal pain and vaginal bleeding. The patient states that she filled on pad earlier today. She states that bleeding is similar to a period. She rates lower abdominal pain at 6/10 now. She denies fever or N/V. Patient was seen on 06/01/14 and US showed IUGS only.    OB History    Gravida Para Term Preterm AB TAB SAB Ectopic Multiple Living   5 2 2  2 1 1   2       Past Medical History  Diagnosis Date  . No pertinent past medical history   . Bacterial vaginosis   . UTI (lower urinary tract infection)   . Chlamydia     x3 , treated   . Abnormal Pap smear 2007    ASC-US     Past Surgical History  Procedure Laterality Date  . Cesarean section      Family History  Problem Relation Age of Onset  . Other Neg Hx   . Hearing loss Neg Hx     History  Substance Use Topics  . Smoking status: Current Every Day Smoker -- 1.00 packs/day for 10 years    Types: Cigarettes  . Smokeless tobacco: Never Used     Comment: with + preg  . Alcohol Use: Yes     Comment: social drinker, has been drinking in September - prior to knowing about pregnancy    Allergies: No Known Allergies  Prescriptions prior to admission  Medication Sig Dispense Refill Last Dose  . azithromycin (ZITHROMAX) 250 MG tablet Take 4 tablets (1,000 mg total) by mouth once. 4 tablet 0   . Prenatal Vit-Fe Fumarate-FA (PRENATAL VITAMINS PLUS) 27-1 MG TABS Take 1 tablet by mouth daily. Generic is OK - Prenatal vitamin with 1 mg Folic acid 30 tablet 0 05/31/2014 at Unknown time  . promethazine (PHENERGAN) 25 MG tablet Take 1 tablet (25 mg total) by mouth every 6 (six) hours as needed for nausea or vomiting. (Patient not taking: Reported on  05/31/2014) 30 tablet 0     Review of Systems  Constitutional: Negative for fever and malaise/fatigue.  Gastrointestinal: Positive for abdominal pain. Negative for nausea and vomiting.  Genitourinary:       + vaginal bleeding   Physical Exam   Blood pressure 111/51, pulse 60, temperature 98 F (36.7 C), temperature source Oral, resp. rate 20, height 5\' 4"  (1.626 m), weight 170 lb 2 oz (77.168 kg), last menstrual period 04/16/2014, not currently breastfeeding.  Physical Exam  Constitutional: She is oriented to person, place, and time. She appears well-developed and well-nourished. No distress.  HENT:  Head: Normocephalic.  Cardiovascular: Normal rate.   Respiratory: Effort normal.  GI: Soft. She exhibits no distension and no mass. There is no tenderness. There is no rebound and no guarding.  Genitourinary: There is bleeding (small amount of blood in the vaginal vault) in the vagina. No vaginal discharge found.  Neurological: She is alert and oriented to person, place, and time.  Skin: Skin is warm and dry. No erythema.  Psychiatric: She has a normal mood and affect.  Cervix: closed, thick  US Ob Transvaginal  06/09/2014   CLINICAL DATA:  Vaginal  bleeding  EXAM: OBSTETRIC <14 WK [place] AND TRANSVAGINAL OB US  TECHNIQUE: Both transabdominal and transvaginal ultrasound examinations were performed for complete evaluation of the gestation as well as the maternal uterus, adnexal regions, and pelvic cul-de-sac. Transvaginal technique was performed to assess early pregnancy.  COMPARISON:  None.  FINDINGS: Intrauterine gestational sac: Irregular gestational sac is noted.  Yolk sac:  Present  Embryo:  None visualized  MSD:  6.8 mm  mm   5 w   2  d  [place] EDC: 02/07/2015  Maternal uterus/adnexae: No acute abnormality noted.  IMPRESSION: Small irregular gestational sac likely representing an early gestation. Probable early intrauterine gestational sac, but no fetal pole, or cardiac activity yet visualized.  Recommend follow-up quantitative B-HCG levels and follow-up US in 14 days to confirm and assess viability. This recommendation follows SRU consensus guidelines: Diagnostic Criteria for Nonviable Pregnancy Early in the First Trimester. [employer] Med 2013; 801:6553-74.   Electronically Signed   By: Inez Catalina M.D.   On: 06/09/2014 23:55    MAU Course  Procedures  MDM [place] today Discussed with patient poor prognostic signs of inapporpriate growth and irregular shape of gestation sac. Patient requests something to "speed up" the process of SAB since this is "what happened to her last time."  Discussed patient with Dr. Roselie Awkward. We can not definitively states failed pregnancy at this time. Cannot offer intervention.   Assessment and Plan  A: IUGS and YS Vaginal bleeding in pregnancy prior to [redacted] weeks gestation Abdominal pain in pregnancy, first trimester  P: Discharge home Bleeding precautions discussed Rx for Percocet given to patient Follow-up US ordered for 1 week. They will call patient with [place] appointment time.  Patient may return to MAU as needed or if her condition were to change or worsen sooner  Luvenia Redden, PA-C  06/10/2014, 1:35 AM

## 2014-06-10 NOTE — MAU Note (Signed)
Urine in lab 

## 2014-06-17 ENCOUNTER — Ambulatory Visit (HOSPITAL_COMMUNITY): Admission: RE | Admit: 2014-06-17 | Payer: Medicaid Other | Source: Ambulatory Visit

## 2014-06-21 ENCOUNTER — Other Ambulatory Visit: Payer: Self-pay | Admitting: Obstetrics & Gynecology

## 2014-06-26 ENCOUNTER — Encounter: Payer: Medicaid Other | Admitting: Obstetrics & Gynecology

## 2014-07-06 ENCOUNTER — Telehealth: Payer: Self-pay | Admitting: *Deleted

## 2014-07-06 ENCOUNTER — Encounter: Payer: Self-pay | Admitting: *Deleted

## 2014-07-06 ENCOUNTER — Encounter: Payer: Medicaid Other | Admitting: Obstetrics & Gynecology

## 2014-07-06 NOTE — Telephone Encounter (Signed)
Courtney Strong missed a scheduled appointment for follow up after sab. Called [name] and had a very bad connection- unable to hear her. Will send letter notifying her she missed her appointment and needs to reschedule it.

## 2014-07-07 ENCOUNTER — Other Ambulatory Visit: Payer: Self-pay | Admitting: Obstetrics & Gynecology

## 2014-07-22 ENCOUNTER — Encounter: Payer: Medicaid Other | Admitting: Obstetrics & Gynecology

## 2014-07-26 ENCOUNTER — Encounter (HOSPITAL_COMMUNITY): Payer: Self-pay | Admitting: *Deleted

## 2014-07-26 ENCOUNTER — Inpatient Hospital Stay (HOSPITAL_COMMUNITY)
Admission: AD | Admit: 2014-07-26 | Discharge: 2014-07-26 | Disposition: A | Discharge status: Home / Self Care | Payer: Medicaid Other | Source: Ambulatory Visit | Attending: Obstetrics & Gynecology | Admitting: Obstetrics & Gynecology

## 2014-07-26 DIAGNOSIS — N898 Other specified noninflammatory disorders of vagina: Secondary | ICD-10-CM | POA: Diagnosis present

## 2014-07-26 DIAGNOSIS — N760 Acute vaginitis: Secondary | ICD-10-CM

## 2014-07-26 DIAGNOSIS — B9689 Other specified bacterial agents as the cause of diseases classified elsewhere: Secondary | ICD-10-CM | POA: Insufficient documentation

## 2014-07-26 DIAGNOSIS — F17210 Nicotine dependence, cigarettes, uncomplicated: Secondary | ICD-10-CM | POA: Insufficient documentation

## 2014-07-26 DIAGNOSIS — A499 Bacterial infection, unspecified: Secondary | ICD-10-CM

## 2014-07-26 LAB — URINALYSIS, ROUTINE W REFLEX MICROSCOPIC
Bilirubin Urine: NEGATIVE
Glucose, UA: NEGATIVE mg/dL
Hgb urine dipstick: NEGATIVE
Ketones, ur: NEGATIVE mg/dL
NITRITE: NEGATIVE
PH: 5.5 (ref 5.0–8.0)
Protein, ur: NEGATIVE mg/dL
Specific Gravity, Urine: 1.025 (ref 1.005–1.030)
Urobilinogen, UA: 1 mg/dL (ref 0.0–1.0)

## 2014-07-26 LAB — URINE MICROSCOPIC-ADD ON

## 2014-07-26 LAB — WET PREP, GENITAL
TRICH WET PREP: NONE SEEN
YEAST WET PREP: NONE SEEN

## 2014-07-26 LAB — POCT PREGNANCY, URINE: PREG TEST UR: NEGATIVE

## 2014-07-26 MED ORDER — METRONIDAZOLE 500 MG PO TABS: 500.0000 mg | ORAL_TABLET | Freq: Two times a day (BID) | ORAL | Status: DC

## 2014-07-26 NOTE — MAU Provider Note (Signed)
History     CSN: [id number]  Arrival date and time: 07/26/14 1633   First Provider Initiated Contact with Patient 07/26/14 1735      Chief Complaint  Patient presents with  . Vaginal Discharge  . Abdominal Pain   HPI Courtney Strong 24 y.o. [id number] presents to MAU complaining of vaginal discharge with bad smell for over a week.  She douched after LMP and this made it worse.  NO vaginal bleeding now but continued cramping.  No dysuria, no itching, weakness, headache.   OB History    Gravida Para Term Preterm AB TAB SAB Ectopic Multiple Living   5 2 2  2 1 1   2       Past Medical History  Diagnosis Date  . No pertinent past medical history   . Bacterial vaginosis   . UTI (lower urinary tract infection)   . Chlamydia     x3 , treated   . Abnormal Pap smear 2007    ASC-US     Past Surgical History  Procedure Laterality Date  . Cesarean section      Family History  Problem Relation Age of Onset  . Other Neg Hx   . Hearing loss Neg Hx     History  Substance Use Topics  . Smoking status: Current Every Day Smoker -- 1.00 packs/day for 10 years    Types: Cigarettes  . Smokeless tobacco: Never Used     Comment: with + preg  . Alcohol Use: Yes     Comment: social drinker, has been drinking in September - prior to knowing about pregnancy    Allergies: No Known Allergies  Prescriptions prior to admission  Medication Sig Dispense Refill Last Dose  . azithromycin (ZITHROMAX) 250 MG tablet TAKE 4 TABLETS BY MOUTH ONCE AS A SINGLE DOSE 4 tablet 0   . oxyCODONE-acetaminophen (ROXICET) 5-325 MG per tablet Take 1-2 tablets by mouth every 4 (four) hours as needed for severe pain. (Patient not taking: Reported on 06/10/2014) 15 tablet 0 Not started  . Prenatal Vit-Fe Fumarate-FA (PRENATAL VITAMINS PLUS) 27-1 MG TABS Take 1 tablet by mouth daily. Generic is OK - Prenatal vitamin with 1 mg Folic acid 30 tablet 0 06/09/2014 at Unknown time  . promethazine (PHENERGAN) 25 MG  tablet Take 1 tablet (25 mg total) by mouth every 6 (six) hours as needed for nausea or vomiting. 30 tablet 0 Past Month at Unknown time    ROS Physical Exam   Blood pressure 120/67, pulse 97, temperature 98.4 F (36.9 C), temperature source Oral, resp. rate 18, height 5\' 5"  (1.651 m), weight 167 lb (75.751 kg), last menstrual period 04/16/2014, unknown if currently breastfeeding.  Physical Exam  Constitutional: She is oriented to person, place, and time. She appears well-developed and well-nourished.  HENT:  Head: Normocephalic and atraumatic.  Eyes: EOM are normal.  Neck: Normal range of motion.  Cardiovascular: Normal rate, regular rhythm and normal heart sounds.   Respiratory: Effort normal and breath sounds normal. No respiratory distress.  GI: Soft. Bowel sounds are normal. She exhibits no distension. There is tenderness.  Tenderness in bilat lower quandrants  Genitourinary:  White malodorous discharge.  Erythematous mucosa.   No CMT/adnexal mass or tenderness.  Musculoskeletal: Normal range of motion.  Neurological: She is alert and oriented to person, place, and time.  Skin: Skin is warm and dry.  Psychiatric: She has a normal mood and affect.   Results for orders placed or performed during the  hospital encounter of 07/26/14 (from the past 24 hour(s))  Urinalysis, Routine w reflex microscopic     Status: Abnormal   Collection Time: 07/26/14  5:00 PM  Result Value Ref Range   Color, Urine YELLOW YELLOW   APPearance CLEAR CLEAR   Specific Gravity, Urine 1.025 1.005 - 1.030   pH 5.5 5.0 - 8.0   Glucose, UA NEGATIVE NEGATIVE mg/dL   Hgb urine dipstick NEGATIVE NEGATIVE   Bilirubin Urine NEGATIVE NEGATIVE   Ketones, ur NEGATIVE NEGATIVE mg/dL   Protein, ur NEGATIVE NEGATIVE mg/dL   Urobilinogen, UA 1.0 0.0 - 1.0 mg/dL   Nitrite NEGATIVE NEGATIVE   Leukocytes, UA SMALL (A) NEGATIVE  Urine microscopic-add on     Status: Abnormal   Collection Time: 07/26/14  5:00 PM   Result Value Ref Range   Squamous Epithelial / LPF FEW (A) RARE   WBC, UA 3-6 <3 WBC/hpf   RBC / HPF 0-2 <3 RBC/hpf   Bacteria, UA FEW (A) RARE  Pregnancy, urine POC     Status: None   Collection Time: 07/26/14  5:35 PM  Result Value Ref Range   Preg Test, Ur NEGATIVE NEGATIVE  Wet prep, genital     Status: Abnormal   Collection Time: 07/26/14  5:40 PM  Result Value Ref Range   Yeast Wet Prep HPF POC NONE SEEN NONE SEEN   Trich, Wet Prep NONE SEEN NONE SEEN   Clue Cells Wet Prep HPF POC FEW (A) NONE SEEN   WBC, Wet Prep HPF POC MODERATE (A) NONE SEEN    MAU Course  Procedures  MDM Bacterial vaginosis is consistent with clinical picture and wet prep results.  No firm evidence for other STD.  Will await cultures.    Assessment and Plan  A:  1. Bacterial vaginosis     P: Discharge to home Flagyl x 1 week No etoh/IC x 10 days Condoms for all IC Additional testing is pending.  Will notify of positive results.  Patient may return to MAU as needed or if her condition were to change or worsen    Paticia Stack 07/26/2014, 5:38 PM

## 2014-07-26 NOTE — MAU Note (Addendum)
Pt reports LMP that ended 2 days ago. Pt has had a fishy order for 1 1/2 wekk. Douched a few nights ago and now feels worse.Pt also reports cramping. Pt also reports a sore throat.

## 2014-07-26 NOTE — Discharge Instructions (Signed)
Bacterial Vaginosis Bacterial vaginosis is a vaginal infection that occurs when the normal balance of bacteria in the vagina is disrupted. It results from an overgrowth of certain bacteria. This is the most common vaginal infection in women of childbearing age. Treatment is important to prevent complications, especially in pregnant women, as it can cause a premature delivery. CAUSES  Bacterial vaginosis is caused by an increase in harmful bacteria that are normally present in smaller amounts in the vagina. Several different kinds of bacteria can cause bacterial vaginosis. However, the reason that the condition develops is not fully understood. RISK FACTORS Certain activities or behaviors can put you at an increased risk of developing bacterial vaginosis, including:  Having a new sex partner or multiple sex partners.  Douching.  Using an intrauterine device (IUD) for contraception. Women do not get bacterial vaginosis from toilet seats, bedding, swimming pools, or contact with objects around them. SIGNS AND SYMPTOMS  Some women with bacterial vaginosis have no signs or symptoms. Common symptoms include:  Grey vaginal discharge.  A fishlike odor with discharge, especially after sexual intercourse.  Itching or burning of the vagina and vulva.  Burning or pain with urination. DIAGNOSIS  Your health care provider will take a medical history and examine the vagina for signs of bacterial vaginosis. A sample of vaginal fluid may be taken. Your health care provider will look at this sample under a microscope to check for bacteria and abnormal cells. A vaginal pH test may also be done.  TREATMENT  Bacterial vaginosis may be treated with antibiotic medicines. These may be given in the form of a pill or a vaginal cream. A second round of antibiotics may be prescribed if the condition comes back after treatment.  HOME CARE INSTRUCTIONS   Only take over-the-counter or prescription medicines as  directed by your health care provider.  If antibiotic medicine was prescribed, take it as directed. Make sure you finish it even if you start to feel better.  Do not have sex until treatment is completed.  Tell all sexual partners that you have a vaginal infection. They should see their health care provider and be treated if they have problems, such as a mild rash or itching.  Practice safe sex by using condoms and only having one sex partner. SEEK MEDICAL CARE IF:   Your symptoms are not improving after 3 days of treatment.  You have increased discharge or pain.  You have a fever. MAKE SURE YOU:   Understand these instructions.  Will watch your condition.  Will get help right away if you are not doing well or get worse. FOR MORE INFORMATION  Centers for Disease Control and Prevention, Division of STD Prevention: www.cdc.gov/std American Sexual Health Association (ASHA): www.ashastd.org  Document Released: 07/10/2005 Document Revised: 04/30/2013 Document Reviewed: 02/19/2013 ExitCare Patient Information 2015 [employer]. This information is not intended to replace advice given to you by your health care provider. Make sure you discuss any questions you have with your health care provider.  

## 2014-07-27 LAB — RPR

## 2014-07-27 LAB — HIV ANTIBODY (ROUTINE TESTING W REFLEX): HIV 1&2 Ab, 4th Generation: NONREACTIVE

## 2014-07-29 LAB — GC/CHLAMYDIA PROBE AMP
CT Probe RNA: NEGATIVE
GC PROBE AMP APTIMA: NEGATIVE

## 2014-10-03 ENCOUNTER — Emergency Department (HOSPITAL_COMMUNITY)
Admission: EM | Admit: 2014-10-03 | Discharge: 2014-10-03 | Disposition: A | Discharge status: Home / Self Care | Payer: Medicaid Other | Attending: Emergency Medicine | Admitting: Emergency Medicine

## 2014-10-03 ENCOUNTER — Encounter (HOSPITAL_COMMUNITY): Payer: Self-pay

## 2014-10-03 DIAGNOSIS — Z720 Tobacco use: Secondary | ICD-10-CM | POA: Diagnosis not present

## 2014-10-03 DIAGNOSIS — J0110 Acute frontal sinusitis, unspecified: Secondary | ICD-10-CM | POA: Diagnosis not present

## 2014-10-03 DIAGNOSIS — Z87440 Personal history of urinary (tract) infections: Secondary | ICD-10-CM | POA: Insufficient documentation

## 2014-10-03 DIAGNOSIS — J069 Acute upper respiratory infection, unspecified: Secondary | ICD-10-CM | POA: Diagnosis not present

## 2014-10-03 DIAGNOSIS — Z8619 Personal history of other infectious and parasitic diseases: Secondary | ICD-10-CM | POA: Diagnosis not present

## 2014-10-03 DIAGNOSIS — H9209 Otalgia, unspecified ear: Secondary | ICD-10-CM | POA: Diagnosis not present

## 2014-10-03 DIAGNOSIS — Z8742 Personal history of other diseases of the female genital tract: Secondary | ICD-10-CM | POA: Diagnosis not present

## 2014-10-03 DIAGNOSIS — R0981 Nasal congestion: Secondary | ICD-10-CM | POA: Diagnosis present

## 2014-10-03 MED ORDER — AMOXICILLIN 500 MG PO TABS: 1000.0000 mg | ORAL_TABLET | Freq: Two times a day (BID) | ORAL | Status: DC

## 2014-10-03 MED ORDER — FLUTICASONE PROPIONATE 50 MCG/ACT NA SUSP: 2.0000 | Freq: Every day | NASAL | Status: DC

## 2014-10-03 NOTE — ED Provider Notes (Signed)
CSN: [id number]     Arrival date & time 10/03/14  1727 History   First MD Initiated Contact with Patient 10/03/14 1831    This chart was scribed for non-physician practitioner, Zacarias Pontes, working with Alfonzo Beers, MD by Terressa Koyanagi, ED Scribe. This patient was seen in room TR10C/TR10C and the patient's care was started at 6:35 PM.  Chief Complaint  Patient presents with  . Facial Pain  . Nasal Congestion   Patient is a 24 y.o. female presenting with URI. The history is provided by the patient. No language interpreter was used.  URI Presenting symptoms: congestion, cough (improving), ear pain ("popping"), facial pain and rhinorrhea   Presenting symptoms: no fever and no sore throat   Congestion:    Location:  Nasal   Interferes with sleep: no     Interferes with eating/drinking: no   Severity:  Severe Onset quality:  Sudden Duration:  3 weeks Timing:  Constant Progression:  Worsening Chronicity:  New Relieved by:  Nothing Worsened by:  Nothing tried Ineffective treatments:  OTC medications Associated symptoms: sinus pain and sneezing   Associated symptoms: no headaches and no wheezing   Risk factors: sick contacts    PCP: No PCP Per Patient  HPI Comments: Courtney Strong is a 24 y.o. female, with PMH noted below including daily tobacco use (0.5 ppd), who presents to the Emergency Department complaining of cold like Sx including rhinorrhea with lime green discharge (onset 3 weeks ago); sinus pressure (onset 3-4 days ago); cough (nearly resolved); photophobia due to sinus pain; ear popping with nose blowing; sneezing; and constant, throbbing 9.5/10 facial pain that is worsened with blowing her nose onset 3 weeks ago. Pt reports taking the following measures at home without relief: Theraflu, Claritin (for 3 days), tylenol sinus and congestion (for 2 weeks).   Pt denies sore throat, Hx of allergies, vision changes, watery eyes, itchy eyes, ear drainage, SOB, abd  pain, vomiting, change in appetite. Pt cannot specify any specific sick contacts but states she always has children at her house and one of them may have been sick.    Past Medical History  Diagnosis Date  . No pertinent past medical history   . Bacterial vaginosis   . UTI (lower urinary tract infection)   . Chlamydia     x3 , treated   . Abnormal Pap smear 2007    ASC-US    Past Surgical History  Procedure Laterality Date  . Cesarean section     Family History  Problem Relation Age of Onset  . Other Neg Hx   . Hearing loss Neg Hx    History  Substance Use Topics  . Smoking status: Current Every Day Smoker -- 0.50 packs/day for 10 years    Types: Cigarettes  . Smokeless tobacco: Never Used     Comment: with + preg  . Alcohol Use: Yes     Comment: social drinker   OB History    Gravida Para Term Preterm AB TAB SAB Ectopic Multiple Living   5 2 2  3 1 2   2      Review of Systems  Constitutional: Negative for fever and chills.  HENT: Positive for congestion, ear pain ("popping"), rhinorrhea, sinus pressure and sneezing. Negative for ear discharge, facial swelling, sore throat and trouble swallowing.   Eyes: Negative for photophobia, discharge, itching and visual disturbance.  Respiratory: Positive for cough (improving). Negative for shortness of breath and wheezing.   Cardiovascular: Negative for  chest pain.  Gastrointestinal: Negative for nausea, vomiting and abdominal pain.  Musculoskeletal:       Facial pain  Skin: Negative for rash.  Neurological: Negative for weakness, numbness and headaches.  Psychiatric/Behavioral: Negative for confusion.   10 Systems reviewed and are negative for acute change except as noted in the HPI.   Allergies  Review of patient's allergies indicates no known allergies.  Home Medications   Prior to Admission medications   Medication Sig Start Date End Date Taking? Authorizing Provider  metroNIDAZOLE (FLAGYL) 500 MG tablet Take 1  tablet (500 mg total) by mouth 2 (two) times daily. 07/26/14   Paticia Stack, PA-C   Triage Vitals: BP 123/64 mmHg  Pulse 99  Temp(Src) 98.3 F (36.8 C) (Oral)  Resp 18  Ht 5\' 4"  (1.626 m)  Wt 155 lb (70.308 kg)  BMI 26.59 kg/m2  SpO2 97%  LMP 09/11/2014 Physical Exam  Constitutional: She is oriented to person, place, and time. Vital signs are normal. She appears well-developed and well-nourished.  Non-toxic appearance. No distress.  Afebrile, nontoxic, NAD  HENT:  Head: Normocephalic and atraumatic.  Right Ear: Hearing, tympanic membrane, external ear and ear canal normal.  Left Ear: Hearing, tympanic membrane, external ear and ear canal normal.  Nose: Mucosal edema and rhinorrhea present. Right sinus exhibits frontal sinus tenderness. Left sinus exhibits frontal sinus tenderness.  Mouth/Throat: Uvula is midline, oropharynx is clear and moist and mucous membranes are normal. No trismus in the jaw. No uvula swelling. No oropharyngeal exudate, posterior oropharyngeal edema, posterior oropharyngeal erythema or tonsillar abscesses.  Ears clear bilaterally. Nose with mucosal edema and erythema, green rhinorrhea, with bilateral frontal sinus tenderness palpation. Oropharynx clear with no tonsillar exudates, erythema, or PTA.  Eyes: Conjunctivae and EOM are normal. Right eye exhibits no discharge. Left eye exhibits no discharge.  Neck: Normal range of motion. Neck supple.  Cardiovascular: Normal rate, regular rhythm, S1 normal, S2 normal, normal heart sounds and normal pulses.  Exam reveals no gallop and no friction rub.   No murmur heard. Pulmonary/Chest: Effort normal and breath sounds normal. No respiratory distress. She has no decreased breath sounds. She has no wheezes. She has no rhonchi. She has no rales.  CTAB in all lung fields, no w/r/r, no hypoxia or increased WOB, speaking in full sentences, SpO2 99% on RA   Abdominal: Normal appearance. She exhibits no distension.   Musculoskeletal: Normal range of motion.  Lymphadenopathy:       Head (right side): Submandibular adenopathy present.       Head (left side): Submandibular adenopathy present.    She has cervical adenopathy.  Mild shotty LAD in submandibular and cervical regions, mildly TTP  Neurological: She is alert and oriented to person, place, and time. She has normal strength. No sensory deficit.  Skin: Skin is warm, dry and intact. No rash noted.  Psychiatric: She has a normal mood and affect. Her behavior is normal.  Nursing note and vitals reviewed.   ED Course  Procedures (including critical care time) DIAGNOSTIC STUDIES: Oxygen Saturation is 97% on RA, nl by my interpretation.    COORDINATION OF CARE: 6:42 PM-Discussed treatment plan which includes medications with pt at bedside and pt agreed to plan.   Labs Review Labs Reviewed - No data to display  Imaging Review No results found.   EKG Interpretation None      MDM   Final diagnoses:  Acute frontal sinusitis, recurrence not specified  URI (upper respiratory infection)  24 y.o. female here with URI and sinusitis. Given duration of symptoms with gradual worsening, differential includes bacterial sinusitis and allergic sinusitis. Will proceed with antibiotics, and antihistamines. Discussed use of nighttime Flonase. Patient is afebrile with clear lung exam, doubt need for any chest imaging. Discussed use of Tylenol and Motrin for pain. Will have her follow-up with primary care doctor in 1 week. I explained the diagnosis and have given explicit precautions to return to the ER including for any other new or worsening symptoms. The patient understands and accepts the medical plan as it's been dictated and I have answered their questions. Discharge instructions concerning home care and prescriptions have been given. The patient is STABLE and is discharged to home in good condition.   I personally performed the services described in  this documentation, which was scribed in my presence. The recorded information has been reviewed and is accurate.  BP 115/71 mmHg  Pulse 93  Temp(Src) 98 F (36.7 C) (Oral)  Resp 16  Ht 5\' 4"  (1.626 m)  Wt 155 lb (70.308 kg)  BMI 26.59 kg/m2  SpO2 99%  LMP 09/11/2014  Meds ordered this encounter  Medications  . fluticasone (FLONASE) 50 MCG/ACT nasal spray    Sig: Place 2 sprays into both nostrils daily.    Dispense:  16 g    Refill:  0    Order Specific Question:  Supervising Provider    Answer:  MILLER, BRIAN [[id number]]  . amoxicillin (AMOXIL) 500 MG tablet    Sig: Take 2 tablets (1,000 mg total) by mouth 2 (two) times daily.    Dispense:  28 tablet    Refill:  0    Order Specific Question:  Supervising Provider    Answer:  Noemi Chapel [[id number]]       Alvetta Hidrogo Camprubi-Soms, PA-C 10/03/14 [place], MD 10/03/14 1945

## 2014-10-03 NOTE — ED Notes (Signed)
Onset 3 weeks cough, congestion, runny nose.  Cough/congestion symptoms improved, still with runny nose- lime green mucus x 1 week, pain between eyebrow pain.  Has tried several OTC meds with no improvement.

## 2014-10-03 NOTE — Discharge Instructions (Signed)
Continue to stay well-hydrated. Continue to alternate between Tylenol and Ibuprofen for pain or fever. STOP SMOKING! Use Mucinex for cough suppression/expectoration of mucus. Use netipot and flonase to help with nasal congestion. May consider over-the-counter Benadryl or other antihistamine to decrease secretions and for watery itchy eyes. Take antibiotic as directed. Followup with your primary care doctor in 5-7 days for recheck of ongoing symptoms. Return to emergency department for emergent changing or worsening of symptoms.   Sinusitis Sinusitis is redness, soreness, and puffiness (inflammation) of the air pockets in the bones of your face (sinuses). The redness, soreness, and puffiness can cause air and mucus to get trapped in your sinuses. This can allow germs to grow and cause an infection.  HOME CARE   Drink enough fluids to keep your pee (urine) clear or pale yellow.  Use a humidifier in your home.  Run a hot shower to create steam in the bathroom. Sit in the bathroom with the door closed. Breathe in the steam 3-4 times a day.  Put a warm, moist washcloth on your face 3-4 times a day, or as told by your doctor.  Use salt water sprays (saline sprays) to wet the thick fluid in your nose. This can help the sinuses drain.  Only take medicine as told by your doctor. GET HELP RIGHT AWAY IF:   Your pain gets worse.  You have very bad headaches.  You are sick to your stomach (nauseous).  You throw up (vomit).  You are very sleepy (drowsy) all the time.  Your face is puffy (swollen).  Your vision changes.  You have a stiff neck.  You have trouble breathing. MAKE SURE YOU:   Understand these instructions.  Will watch your condition.  Will get help right away if you are not doing well or get worse. Document Released: 12/27/2007 Document Revised: 04/03/2012 Document Reviewed: 02/13/2012 [facility] Patient Information 2015 [place], [place]. This information is not intended to  replace advice given to you by your health care provider. Make sure you discuss any questions you have with your health care provider.  Upper Respiratory Infection, Adult An upper respiratory infection (URI) is also known as the common cold. It is often caused by a type of germ (virus). Colds are easily spread (contagious). You can pass it to others by kissing, coughing, sneezing, or drinking out of the same glass. Usually, you get better in 1 or 2 weeks.  HOME CARE   Only take medicine as told by your doctor.  Use a warm mist humidifier or breathe in steam from a hot shower.  Drink enough water and fluids to keep your pee (urine) clear or pale yellow.  Get plenty of rest.  Return to work when your temperature is back to normal or as told by your doctor. You may use a face mask and wash your hands to stop your cold from spreading. GET HELP RIGHT AWAY IF:   After the first few days, you feel you are getting worse.  You have questions about your medicine.  You have chills, shortness of breath, or brown or red spit (mucus).  You have yellow or brown snot (nasal discharge) or pain in the face, especially when you bend forward.  You have a fever, puffy (swollen) neck, pain when you swallow, or white spots in the back of your throat.  You have a bad headache, ear pain, sinus pain, or chest pain.  You have a high-pitched whistling sound when you breathe in and out (wheezing).  You have a lasting cough or cough up blood.  You have sore muscles or a stiff neck. MAKE SURE YOU:   Understand these instructions.  Will watch your condition.  Will get help right away if you are not doing well or get worse. Document Released: 12/27/2007 Document Revised: 10/02/2011 Document Reviewed: 10/15/2013 [facility] Patient Information 2015 [place], [place]. This information is not intended to replace advice given to you by your health care provider. Make sure you discuss any questions you have with your  health care provider.

## 2014-10-05 ENCOUNTER — Emergency Department (HOSPITAL_COMMUNITY)
Admission: EM | Admit: 2014-10-05 | Discharge: 2014-10-05 | Disposition: A | Discharge status: Home / Self Care | Payer: Medicaid Other | Attending: Emergency Medicine | Admitting: Emergency Medicine

## 2014-10-05 ENCOUNTER — Encounter (HOSPITAL_COMMUNITY): Payer: Self-pay | Admitting: Radiology

## 2014-10-05 DIAGNOSIS — Z87440 Personal history of urinary (tract) infections: Secondary | ICD-10-CM | POA: Diagnosis not present

## 2014-10-05 DIAGNOSIS — Z792 Long term (current) use of antibiotics: Secondary | ICD-10-CM | POA: Diagnosis not present

## 2014-10-05 DIAGNOSIS — Z720 Tobacco use: Secondary | ICD-10-CM | POA: Diagnosis not present

## 2014-10-05 DIAGNOSIS — R590 Localized enlarged lymph nodes: Secondary | ICD-10-CM

## 2014-10-05 DIAGNOSIS — H9203 Otalgia, bilateral: Secondary | ICD-10-CM | POA: Diagnosis not present

## 2014-10-05 DIAGNOSIS — J069 Acute upper respiratory infection, unspecified: Secondary | ICD-10-CM | POA: Insufficient documentation

## 2014-10-05 DIAGNOSIS — Z8619 Personal history of other infectious and parasitic diseases: Secondary | ICD-10-CM | POA: Insufficient documentation

## 2014-10-05 DIAGNOSIS — Z8742 Personal history of other diseases of the female genital tract: Secondary | ICD-10-CM | POA: Diagnosis not present

## 2014-10-05 DIAGNOSIS — J0110 Acute frontal sinusitis, unspecified: Secondary | ICD-10-CM

## 2014-10-05 DIAGNOSIS — M542 Cervicalgia: Secondary | ICD-10-CM | POA: Diagnosis present

## 2014-10-05 MED ORDER — ACETAMINOPHEN 500 MG PO CAPS: ORAL_CAPSULE | ORAL | Status: DC

## 2014-10-05 MED ORDER — IBUPROFEN 600 MG PO TABS: 600.0000 mg | ORAL_TABLET | Freq: Four times a day (QID) | ORAL | Status: DC | PRN

## 2014-10-05 MED ORDER — IBUPROFEN 800 MG PO TABS
800.0000 mg | ORAL_TABLET | Freq: Once | ORAL | Status: AC
  Administered 2014-10-05: 800 mg via ORAL
  Filled 2014-10-05: qty 1

## 2014-10-05 NOTE — ED Notes (Signed)
ED PA at bedside

## 2014-10-05 NOTE — ED Provider Notes (Signed)
CSN: [id number]     Arrival date & time 10/05/14  5732 History   First MD Initiated Contact with Patient 10/05/14 1009     Chief Complaint  Patient presents with  . Neck Pain     (Consider location/radiation/quality/duration/timing/severity/associated sxs/prior Treatment) HPI Pt is a 24yo female dx with a URI as well as frontal sinusitis, started on a 10 day course of amoxicillin on 10/03/14 after 3 week hx of nasal congestion and green mucous.  Pt states that this morning around 130 or 2AM, she noticed left sided cervical lymph node swelling and pain. Pt states pain is so bad it hurts to swallow.  Denies difficulty breathing or keeping down food, states "it just hurts to swallow"   Reports intermittent hot and cold chills, denies vomiting or diarrhea. Denies sick contacts or recent travel. No rash. States she is taking amoxicillin as prescribed as well as Flonase.  Denies using acetaminophen or ibuprofen at home.    Past Medical History  Diagnosis Date  . No pertinent past medical history   . Bacterial vaginosis   . UTI (lower urinary tract infection)   . Chlamydia     x3 , treated   . Abnormal Pap smear 2007    ASC-US    Past Surgical History  Procedure Laterality Date  . Cesarean section     Family History  Problem Relation Age of Onset  . Other Neg Hx   . Hearing loss Neg Hx    History  Substance Use Topics  . Smoking status: Current Every Day Smoker -- 0.50 packs/day for 10 years    Types: Cigarettes  . Smokeless tobacco: Never Used     Comment: with + preg  . Alcohol Use: Yes     Comment: social drinker   OB History    Gravida Para Term Preterm AB TAB SAB Ectopic Multiple Living   5 2 2  3 1 2   2      Review of Systems  Constitutional: Positive for fever, chills, appetite change and fatigue. Negative for diaphoresis.  HENT: Positive for congestion, ear pain ( bilateral), sinus pressure and sore throat. Negative for trouble swallowing and voice change.    Respiratory: Positive for cough. Negative for shortness of breath.   Cardiovascular: Negative for chest pain and palpitations.  Gastrointestinal: Negative for nausea, vomiting, abdominal pain and diarrhea.  Musculoskeletal: Positive for neck pain ( left side of neck, lymph node). Negative for myalgias, arthralgias and neck stiffness.  Skin: Negative for rash.  All other systems reviewed and are negative.     Allergies  Review of patient's allergies indicates no known allergies.  Home Medications   Prior to Admission medications   Medication Sig Start Date End Date Taking? Authorizing Provider  Acetaminophen 500 MG coapsule Take 1-2 tabs every 4-6 hours as needed for fever and pain 10/05/14   Noland Fordyce, PA-C  amoxicillin (AMOXIL) 500 MG tablet Take 2 tablets (1,000 mg total) by mouth 2 (two) times daily. 10/03/14   Mercedes Camprubi-Soms, PA-C  fluticasone (FLONASE) 50 MCG/ACT nasal spray Place 2 sprays into both nostrils daily. 10/03/14   Mercedes Camprubi-Soms, PA-C  ibuprofen (ADVIL,MOTRIN) 600 MG tablet Take 1 tablet (600 mg total) by mouth every 6 (six) hours as needed. 10/05/14   Noland Fordyce, PA-C  metroNIDAZOLE (FLAGYL) 500 MG tablet Take 1 tablet (500 mg total) by mouth 2 (two) times daily. 07/26/14   Collene Leyden Teague Clark, PA-C   BP 120/64 mmHg  Pulse  81  Temp(Src) 98.5 F (36.9 C) (Oral)  Resp 16  Ht 5\' 4"  (1.626 m)  Wt 160 lb (72.576 kg)  BMI 27.45 kg/m2  SpO2 98%  LMP 09/11/2014 Physical Exam  Constitutional: She appears well-developed and well-nourished. No distress.  Pt sitting in exam bed, appears well, non-toxic. NAD  HENT:  Head: Normocephalic and atraumatic.  Right Ear: Hearing, tympanic membrane, external ear and ear canal normal. No tenderness. No mastoid tenderness. Tympanic membrane is not perforated, not erythematous, not retracted and not bulging.  Left Ear: Hearing, tympanic membrane, external ear and ear canal normal. No mastoid tenderness. Tympanic  membrane is not perforated, not erythematous, not retracted and not bulging.  Nose: Mucosal edema present. Right sinus exhibits frontal sinus tenderness. Left sinus exhibits frontal sinus tenderness.  Mouth/Throat: Uvula is midline and mucous membranes are normal. Posterior oropharyngeal erythema present. No oropharyngeal exudate, posterior oropharyngeal edema or tonsillar abscesses.  Eyes: Conjunctivae are normal. No scleral icterus.  Neck: Normal range of motion. Neck supple.  No nuchal rigidity or meningeal signs.  Cardiovascular: Normal rate, regular rhythm and normal heart sounds.   Pulmonary/Chest: Effort normal and breath sounds normal. No respiratory distress. She has no wheezes. She has no rales. She exhibits no tenderness.  Abdominal: Soft. Bowel sounds are normal. She exhibits no distension and no mass. There is no tenderness. There is no rebound and no guarding.  Musculoskeletal: Normal range of motion.  Lymphadenopathy:    She has cervical adenopathy ( left side).  Neurological: She is alert.  Skin: Skin is warm and dry. She is not diaphoretic.  Nursing note and vitals reviewed.   ED Course  Procedures (including critical care time) Labs Review Labs Reviewed - No data to display  Imaging Review No results found.   EKG Interpretation None      MDM   Final diagnoses:  Left cervical lymphadenopathy  URI (upper respiratory infection)  Acute frontal sinusitis, recurrence not specified   Pt presenting to ED with left sided cervical adenopathy after being dx with URI and acute frontal sinusitis on 10/03/14. Pt is still currently taking her amoxicillin and using flonase.  No meningeal signs on exam. Pt is afebrile.  O2-97-99% on RA.  Pt appears well, non-toxic.  Swollen lymph node likely due to current sinus infection.  Discussed use of alternating acetaminophen and ibuprofen as well as getting rest and staying well hydrated. No tonsillar edema, uvula edema. No stridor. Pt  able to keep down PO ibuprofen and several ounces of PO fluids. Discussed f/u with [facility] for recheck of symptoms if not improving in 5 days. Encouraged to keep taking amoxicillin until prescription completed. Return precautions provided. Pt verbalized understanding and agreement with tx plan.    Noland Fordyce, PA-C 10/05/14 [place] Yao, MD 10/05/14 [phone]

## 2014-10-05 NOTE — ED Notes (Signed)
Pt presents with lymph node swelling. Pt states that she was seen at this facility and diagnosed with a sinus infection. Pt states that the swelling started "around 1:30 2:00 in the morning. Pt states that she is unable to eat or drink. Pt is able to talk in full sentence.

## 2014-12-21 ENCOUNTER — Emergency Department (HOSPITAL_COMMUNITY): Payer: Medicaid Other

## 2014-12-21 ENCOUNTER — Encounter (HOSPITAL_COMMUNITY): Payer: Self-pay | Admitting: *Deleted

## 2014-12-21 ENCOUNTER — Emergency Department (HOSPITAL_COMMUNITY)
Admission: EM | Admit: 2014-12-21 | Discharge: 2014-12-21 | Disposition: A | Discharge status: Home / Self Care | Payer: Medicaid Other | Attending: Emergency Medicine | Admitting: Emergency Medicine

## 2014-12-21 DIAGNOSIS — Z8742 Personal history of other diseases of the female genital tract: Secondary | ICD-10-CM | POA: Diagnosis not present

## 2014-12-21 DIAGNOSIS — Y9389 Activity, other specified: Secondary | ICD-10-CM | POA: Diagnosis not present

## 2014-12-21 DIAGNOSIS — Y9289 Other specified places as the place of occurrence of the external cause: Secondary | ICD-10-CM | POA: Diagnosis not present

## 2014-12-21 DIAGNOSIS — S8991XA Unspecified injury of right lower leg, initial encounter: Secondary | ICD-10-CM | POA: Diagnosis present

## 2014-12-21 DIAGNOSIS — Z87440 Personal history of urinary (tract) infections: Secondary | ICD-10-CM | POA: Diagnosis not present

## 2014-12-21 DIAGNOSIS — Z8619 Personal history of other infectious and parasitic diseases: Secondary | ICD-10-CM | POA: Diagnosis not present

## 2014-12-21 DIAGNOSIS — Z720 Tobacco use: Secondary | ICD-10-CM | POA: Insufficient documentation

## 2014-12-21 DIAGNOSIS — Y999 Unspecified external cause status: Secondary | ICD-10-CM | POA: Diagnosis not present

## 2014-12-21 DIAGNOSIS — W1830XA Fall on same level, unspecified, initial encounter: Secondary | ICD-10-CM | POA: Diagnosis not present

## 2014-12-21 DIAGNOSIS — Z792 Long term (current) use of antibiotics: Secondary | ICD-10-CM | POA: Diagnosis not present

## 2014-12-21 DIAGNOSIS — S81011A Laceration without foreign body, right knee, initial encounter: Secondary | ICD-10-CM | POA: Insufficient documentation

## 2014-12-21 DIAGNOSIS — W19XXXA Unspecified fall, initial encounter: Secondary | ICD-10-CM

## 2014-12-21 DIAGNOSIS — Z7951 Long term (current) use of inhaled steroids: Secondary | ICD-10-CM | POA: Diagnosis not present

## 2014-12-21 MED ORDER — HYDROCODONE-ACETAMINOPHEN 5-325 MG PO TABS: 1.0000 | ORAL_TABLET | Freq: Four times a day (QID) | ORAL | Status: DC | PRN

## 2014-12-21 MED ORDER — LIDOCAINE HCL 2 % IJ SOLN
20.0000 mL | Freq: Once | INTRAMUSCULAR | Status: AC
  Administered 2014-12-21: 400 mg via INTRADERMAL
  Filled 2014-12-21: qty 20

## 2014-12-21 MED ORDER — IBUPROFEN 800 MG PO TABS: 800.0000 mg | ORAL_TABLET | Freq: Three times a day (TID) | ORAL | Status: DC | PRN

## 2014-12-21 MED ORDER — OXYCODONE-ACETAMINOPHEN 5-325 MG PO TABS
1.0000 | ORAL_TABLET | Freq: Once | ORAL | Status: AC
  Administered 2014-12-21: 1 via ORAL
  Filled 2014-12-21: qty 1

## 2014-12-21 NOTE — ED Provider Notes (Signed)
CSN: [id number]     Arrival date & time 12/21/14  1725 History  This chart was scribed for non-physician practitioner, Irena Cords, PA-C, working with Orpah Greek, MD, by Stephania Fragmin, ED Scribe. This patient was seen in room TR06C/TR06C and the patient's care was started at 6:39 PM.  Chief Complaint  Patient presents with  . Fall   The history is provided by the patient. No language interpreter was used.    HPI Comments: Courtney Strong is a 24 y.o. female who presents to the Emergency Department complaining of a laceration to her right knee that onset 2 hours ago, after she accidentally fell from her porch while trying to catch her son from falling. She complains of associated constant, severe, throbbing pain to the area, adding that "I've had multiple pregnancies, but this hurts even worse than giving birth." She had applied iodine and a warm compress to the area. Patient reports her tetanus shot is UTD.   Past Medical History  Diagnosis Date  . No pertinent past medical history   . Bacterial vaginosis   . UTI (lower urinary tract infection)   . Chlamydia     x3 , treated   . Abnormal Pap smear 2007    ASC-US    Past Surgical History  Procedure Laterality Date  . Cesarean section     Family History  Problem Relation Age of Onset  . Other Neg Hx   . Hearing loss Neg Hx    History  Substance Use Topics  . Smoking status: Current Every Day Smoker -- 0.50 packs/day for 10 years    Types: Cigarettes  . Smokeless tobacco: Never Used     Comment: with + preg  . Alcohol Use: Yes     Comment: social drinker   OB History    Gravida Para Term Preterm AB TAB SAB Ectopic Multiple Living   5 2 2  3 1 2   2      Review of Systems A complete 10 system review of systems was obtained and all systems are negative except as noted in the HPI and PMH.    Allergies  Review of patient's allergies indicates no known allergies.  Home Medications   Prior to Admission  medications   Medication Sig Start Date End Date Taking? Authorizing Provider  Acetaminophen 500 MG coapsule Take 1-2 tabs every 4-6 hours as needed for fever and pain 10/05/14   Noland Fordyce, PA-C  amoxicillin (AMOXIL) 500 MG tablet Take 2 tablets (1,000 mg total) by mouth 2 (two) times daily. 10/03/14   Mercedes Camprubi-Soms, PA-C  fluticasone (FLONASE) 50 MCG/ACT nasal spray Place 2 sprays into both nostrils daily. 10/03/14   Mercedes Camprubi-Soms, PA-C  ibuprofen (ADVIL,MOTRIN) 600 MG tablet Take 1 tablet (600 mg total) by mouth every 6 (six) hours as needed. 10/05/14   Noland Fordyce, PA-C  metroNIDAZOLE (FLAGYL) 500 MG tablet Take 1 tablet (500 mg total) by mouth 2 (two) times daily. 07/26/14   Collene Leyden Teague Clark, PA-C   BP 113/72 mmHg  Pulse 94  Temp(Src) 98.6 F (37 C) (Oral)  Resp 18  SpO2 99% Physical Exam  Constitutional: She is oriented to person, place, and time. She appears well-developed and well-nourished. No distress.  HENT:  Head: Normocephalic and atraumatic.  Eyes: Conjunctivae and EOM are normal.  Neck: Neck supple. No tracheal deviation present.  Cardiovascular: Normal rate.   Pulmonary/Chest: Effort normal. No respiratory distress.  Musculoskeletal: Normal range of motion. She exhibits  tenderness.  Neurological: She is alert and oriented to person, place, and time.  Skin: Skin is warm and dry.  Laceration across the mid portion of anterior right knee, with pain and swelling.   Psychiatric: She has a normal mood and affect. Her behavior is normal.  Nursing note and vitals reviewed.   ED Course  Procedures (including critical care time)  DIAGNOSTIC STUDIES: Oxygen Saturation is 99% on RA, normal by my interpretation.    COORDINATION OF CARE: 6:47 PM - Discussed treatment plan with pt at bedside which includes XR and pain medication, and pt agreed to plan.   Medications  oxyCODONE-acetaminophen (PERCOCET/ROXICET) 5-325 MG per tablet 1 tablet (not  administered)    LACERATION REPAIR PROCEDURE NOTE The patient's identification was confirmed and consent was obtained. This procedure was performed by Irena Cords, PA-C, working Orpah Greek, MD at 9:00 PM. Site: Mid portion of anterior right knee Sterile procedures observed Anesthetic used (type and amt): 2% Lidocaine without Epi, 4 mL Suture type/size: 4-0 prolene Length: 4 cm # of Sutures: 9 Technique: Simple interrupted Antibx ointment applied Tetanus UTD Site anesthetized, irrigated with NS, explored without evidence of foreign body, wound well approximated, site covered with dry, sterile dressing.  Patient tolerated procedure well without complications. Instructions for care discussed verbally and patient provided with additional written instructions for homecare and f/u.  The lateral aspect of the wound had a thin flap that was unable to be sutured. Pt was made aware of this fact.  Imaging Review Dg Knee Complete 4 Views Right  12/21/2014   CLINICAL DATA:  Fall, anterior knee laceration, pain  EXAM: RIGHT KNEE - COMPLETE 4+ VIEW  COMPARISON:  None.  FINDINGS: No fracture or dislocation is seen.  The joint spaces are preserved.  Mild soft tissue swelling along the infrapatellar soft tissues.  No suprapatellar knee joint effusion.  No radiopaque foreign body is seen.  IMPRESSION: Mild infrapatellar soft tissue swelling.  No fracture, dislocation, or radiopaque foreign body is seen.   Electronically Signed   By: Julian Hy M.D.   On: 12/21/2014 20:27  I personally performed the services described in this documentation, which was scribed in my presence. The recorded information has been reviewed and is accurate.    Dalia Heading, PA-C 12/29/14 [place], MD 12/30/14 [id number]

## 2014-12-21 NOTE — ED Notes (Signed)
Pt in stating she fell off her porch earlier, laceration to right knee, states she hit her head but denies LOC, no distress noted

## 2014-12-21 NOTE — Discharge Instructions (Signed)
Keep the area clean and dry.  Follow-up in 14 days to have the sutures removed.  Return here for any signs of infection

## 2014-12-25 ENCOUNTER — Encounter (HOSPITAL_COMMUNITY): Payer: Self-pay | Admitting: *Deleted

## 2014-12-25 ENCOUNTER — Emergency Department (HOSPITAL_COMMUNITY)
Admission: EM | Admit: 2014-12-25 | Discharge: 2014-12-25 | Discharge status: Against Medical Advice | Payer: Medicaid Other | Attending: Emergency Medicine | Admitting: Emergency Medicine

## 2014-12-25 DIAGNOSIS — R51 Headache: Secondary | ICD-10-CM | POA: Diagnosis not present

## 2014-12-25 DIAGNOSIS — Z720 Tobacco use: Secondary | ICD-10-CM | POA: Diagnosis not present

## 2014-12-25 NOTE — ED Notes (Signed)
No answer in waiting area.

## 2014-12-25 NOTE — ED Notes (Signed)
Nurse 1st unable to locate pt for reassessment.  No answer in waiting area.

## 2014-12-25 NOTE — ED Notes (Signed)
The pt is c/o a headache and not answering any other  Questions. She reports that she just came here from [place] where she was given a shot fior her headache.  Very sleepy.  lmpo unknown

## 2015-02-18 ENCOUNTER — Emergency Department (HOSPITAL_COMMUNITY)
Admission: EM | Admit: 2015-02-18 | Discharge: 2015-02-18 | Disposition: A | Discharge status: Home / Self Care | Payer: Medicaid Other | Attending: Emergency Medicine | Admitting: Emergency Medicine

## 2015-02-18 ENCOUNTER — Encounter (HOSPITAL_COMMUNITY): Payer: Self-pay | Admitting: Emergency Medicine

## 2015-02-18 DIAGNOSIS — Z720 Tobacco use: Secondary | ICD-10-CM | POA: Diagnosis not present

## 2015-02-18 DIAGNOSIS — R21 Rash and other nonspecific skin eruption: Secondary | ICD-10-CM

## 2015-02-18 DIAGNOSIS — Z8619 Personal history of other infectious and parasitic diseases: Secondary | ICD-10-CM | POA: Diagnosis not present

## 2015-02-18 DIAGNOSIS — Z7951 Long term (current) use of inhaled steroids: Secondary | ICD-10-CM | POA: Insufficient documentation

## 2015-02-18 DIAGNOSIS — Z8742 Personal history of other diseases of the female genital tract: Secondary | ICD-10-CM | POA: Insufficient documentation

## 2015-02-18 DIAGNOSIS — Z87440 Personal history of urinary (tract) infections: Secondary | ICD-10-CM | POA: Diagnosis not present

## 2015-02-18 HISTORY — DX: Migraine, unspecified, not intractable, without status migrainosus: G43.909

## 2015-02-18 MED ORDER — PERMETHRIN 5 % EX CREA: TOPICAL_CREAM | CUTANEOUS | Status: DC

## 2015-02-18 NOTE — ED Notes (Signed)
Patient has been in the restroom since arrival to [facility].   Cannot triage until patient is in the room.

## 2015-02-18 NOTE — ED Provider Notes (Signed)
CSN: [id number]     Arrival date & time 02/18/15  0725 History   First MD Initiated Contact with Patient 02/18/15 [phone]     No chief complaint on file.    (Consider location/radiation/quality/duration/timing/severity/associated sxs/prior Treatment) HPI Comments: Patient presents today with a rash.  Rash located on both arms, both legs, and the buttocks.   Rash does itch, but is not painful.  She states that the rash has been present since November 2015.  She states that she was seen in the ED in [facility] and was diagnosed with bed bugs.  She states that she was given a cream, which helped somewhat but then she ran out of the cream.  She reports no known contacts with similar rash.  She currently lives in a hotel.  She denies new medications, detergents, lotions, or soaps.  She denies fever, chills, nausea, vomiting, sore throat, or headache.    The history is provided by the patient.    Past Medical History  Diagnosis Date  . No pertinent past medical history   . Bacterial vaginosis   . UTI (lower urinary tract infection)   . Chlamydia     x3 , treated   . Abnormal Pap smear 2007    ASC-US    Past Surgical History  Procedure Laterality Date  . Cesarean section     Family History  Problem Relation Age of Onset  . Other Neg Hx   . Hearing loss Neg Hx    History  Substance Use Topics  . Smoking status: Current Every Day Smoker -- 0.50 packs/day for 10 years    Types: Cigarettes  . Smokeless tobacco: Never Used     Comment: with + preg  . Alcohol Use: Yes     Comment: social drinker   OB History    Gravida Para Term Preterm AB TAB SAB Ectopic Multiple Living   5 2 2  3 1 2   2      Review of Systems  All other systems reviewed and are negative.     Allergies  Review of patient's allergies indicates no known allergies.  Home Medications   Prior to Admission medications   Medication Sig Start Date End Date Taking? Authorizing Provider  Acetaminophen 500 MG  coapsule Take 1-2 tabs every 4-6 hours as needed for fever and pain 10/05/14   Noland Fordyce, PA-C  fluticasone [facility]) 50 MCG/ACT nasal spray Place 2 sprays into both nostrils daily. 10/03/14   Mercedes Camprubi-Soms, PA-C  ibuprofen (ADVIL,MOTRIN) 800 MG tablet Take 1 tablet (800 mg total) by mouth every 8 (eight) hours as needed. 12/21/14   Christopher Lawyer, PA-C   BP 130/76 mmHg  Pulse 114  Temp(Src) 98.2 F (36.8 C) (Oral)  Resp 14  SpO2 100% Physical Exam  Constitutional: She appears well-developed and well-nourished.  HENT:  Head: Normocephalic and atraumatic.  Mouth/Throat: Oropharynx is clear and moist.  No oral lesions  Neck: Normal range of motion. Neck supple.  Cardiovascular: Normal rate, regular rhythm and normal heart sounds.   Pulmonary/Chest: Effort normal and breath sounds normal.  Musculoskeletal: Normal range of motion.  Neurological: She is alert.  Skin: Skin is warm and dry.  Diffuse erythematous papules located on both arms, both hands, both legs, and buttocks.  Rash most prominent on the buttocks.  Psychiatric: She has a normal mood and affect.  Nursing note and vitals reviewed.   ED Course  Procedures (including critical care time) Labs Review Labs Reviewed - No  data to display  Imaging Review No results found.   EKG Interpretation None      MDM   Final diagnoses:  None   Patient presents today with rash that has been present since November.  Patient is afebrile.  No oral lesions.  Feel that the patient is stable for discharge.  Patient instructed to follow up with PCP or Dermatology if no improvement.  Return precautions given.      Hyman Bible, PA-C 02/18/15 0511  Leonard Schwartz, MD 02/19/15 [phone]

## 2015-02-18 NOTE — ED Notes (Signed)
Patient states had bed bugs in November after visiting [place].   Patient states the rash never cleared.  Patient states noone else in her household has rash.

## 2015-03-12 ENCOUNTER — Inpatient Hospital Stay (HOSPITAL_COMMUNITY)
Admission: AD | Admit: 2015-03-12 | Discharge: 2015-03-13 | Disposition: A | Discharge status: Home / Self Care | Payer: Self-pay | Source: Ambulatory Visit | Attending: Family Medicine | Admitting: Family Medicine

## 2015-03-12 DIAGNOSIS — F17210 Nicotine dependence, cigarettes, uncomplicated: Secondary | ICD-10-CM | POA: Insufficient documentation

## 2015-03-12 DIAGNOSIS — N760 Acute vaginitis: Secondary | ICD-10-CM | POA: Insufficient documentation

## 2015-03-12 DIAGNOSIS — B9689 Other specified bacterial agents as the cause of diseases classified elsewhere: Secondary | ICD-10-CM | POA: Insufficient documentation

## 2015-03-12 DIAGNOSIS — Z87440 Personal history of urinary (tract) infections: Secondary | ICD-10-CM | POA: Insufficient documentation

## 2015-03-12 LAB — POCT PREGNANCY, URINE: Preg Test, Ur: NEGATIVE

## 2015-03-12 LAB — URINALYSIS, ROUTINE W REFLEX MICROSCOPIC
BILIRUBIN URINE: NEGATIVE
Glucose, UA: NEGATIVE mg/dL
HGB URINE DIPSTICK: NEGATIVE
Ketones, ur: NEGATIVE mg/dL
Nitrite: POSITIVE — AB
PH: 6.5 (ref 5.0–8.0)
Protein, ur: NEGATIVE mg/dL
SPECIFIC GRAVITY, URINE: 1.01 (ref 1.005–1.030)
Urobilinogen, UA: 0.2 mg/dL (ref 0.0–1.0)

## 2015-03-12 LAB — URINE MICROSCOPIC-ADD ON

## 2015-03-12 NOTE — MAU Note (Signed)
Vaginal discharge with odor. Used Nair in bikini area and ran over vag area and now has burning when urinates. Main concern is smelly vag d/c

## 2015-03-13 ENCOUNTER — Encounter (HOSPITAL_COMMUNITY): Payer: Self-pay | Admitting: *Deleted

## 2015-03-13 DIAGNOSIS — A499 Bacterial infection, unspecified: Secondary | ICD-10-CM

## 2015-03-13 DIAGNOSIS — N760 Acute vaginitis: Secondary | ICD-10-CM

## 2015-03-13 LAB — RPR: RPR Ser Ql: NONREACTIVE

## 2015-03-13 LAB — WET PREP, GENITAL
Trich, Wet Prep: NONE SEEN
Yeast Wet Prep HPF POC: NONE SEEN

## 2015-03-13 LAB — HIV ANTIBODY (ROUTINE TESTING W REFLEX): HIV Screen 4th Generation wRfx: NONREACTIVE

## 2015-03-13 MED ORDER — METRONIDAZOLE 500 MG PO TABS: 500.0000 mg | ORAL_TABLET | Freq: Two times a day (BID) | ORAL | Status: DC

## 2015-03-13 NOTE — Progress Notes (Signed)
Written and verbal d/c instructions given and understanding voiced. 

## 2015-03-13 NOTE — Discharge Instructions (Signed)
Bacterial Vaginosis Bacterial vaginosis is a vaginal infection that occurs when the normal balance of bacteria in the vagina is disrupted. It results from an overgrowth of certain bacteria. This is the most common vaginal infection in women of childbearing age. Treatment is important to prevent complications, especially in pregnant women, as it can cause a premature delivery. CAUSES  Bacterial vaginosis is caused by an increase in harmful bacteria that are normally present in smaller amounts in the vagina. Several different kinds of bacteria can cause bacterial vaginosis. However, the reason that the condition develops is not fully understood. RISK FACTORS Certain activities or behaviors can put you at an increased risk of developing bacterial vaginosis, including:  Having a new sex partner or multiple sex partners.  Douching.  Using an intrauterine device (IUD) for contraception. Women do not get bacterial vaginosis from toilet seats, bedding, swimming pools, or contact with objects around them. SIGNS AND SYMPTOMS  Some women with bacterial vaginosis have no signs or symptoms. Common symptoms include:  Grey vaginal discharge.  A fishlike odor with discharge, especially after sexual intercourse.  Itching or burning of the vagina and vulva.  Burning or pain with urination. DIAGNOSIS  Your health care provider will take a medical history and examine the vagina for signs of bacterial vaginosis. A sample of vaginal fluid may be taken. Your health care provider will look at this sample under a microscope to check for bacteria and abnormal cells. A vaginal pH test may also be done.  TREATMENT  Bacterial vaginosis may be treated with antibiotic medicines. These may be given in the form of a pill or a vaginal cream. A second round of antibiotics may be prescribed if the condition comes back after treatment.  HOME CARE INSTRUCTIONS   Only take over-the-counter or prescription medicines as  directed by your health care provider.  If antibiotic medicine was prescribed, take it as directed. Make sure you finish it even if you start to feel better.  Do not have sex until treatment is completed.  Tell all sexual partners that you have a vaginal infection. They should see their health care provider and be treated if they have problems, such as a mild rash or itching.  Practice safe sex by using condoms and only having one sex partner. SEEK MEDICAL CARE IF:   Your symptoms are not improving after 3 days of treatment.  You have increased discharge or pain.  You have a fever. MAKE SURE YOU:   Understand these instructions.  Will watch your condition.  Will get help right away if you are not doing well or get worse. FOR MORE INFORMATION  Centers for Disease Control and Prevention, Division of STD Prevention: www.cdc.gov/std American Sexual Health Association (ASHA): www.ashastd.org  Document Released: 07/10/2005 Document Revised: 04/30/2013 Document Reviewed: 02/19/2013 ExitCare Patient Information 2015 [employer]. This information is not intended to replace advice given to you by your health care provider. Make sure you discuss any questions you have with your health care provider.  

## 2015-03-13 NOTE — MAU Provider Note (Signed)
History     CSN: [id number]  Arrival date and time: 03/12/15 2222   First Provider Initiated Contact with Patient 03/13/15 0302      Chief Complaint  Patient presents with  . Vaginal Discharge   HPI Courtney Strong 24 y.o. nonpregnant female presents for eval of smelly vaginal discharge.  It started 2 days ago.  She's had this before and it's been BV or Trich.  She denies dysuria, nausea, vomiting, vaginal bleeding, weakness, fever.  Uses condoms some of the time.  LMP was July 21.   OB History    Gravida Para Term Preterm AB TAB SAB Ectopic Multiple Living   5 2 2  3 1 2   2       Past Medical History  Diagnosis Date  . No pertinent past medical history   . Bacterial vaginosis   . UTI (lower urinary tract infection)   . Chlamydia     x3 , treated   . Abnormal Pap smear 2007    ASC-US   . Migraines     Past Surgical History  Procedure Laterality Date  . Cesarean section      Family History  Problem Relation Age of Onset  . Other Neg Hx   . Hearing loss Neg Hx     Social History  Substance Use Topics  . Smoking status: Current Every Day Smoker -- 1.00 packs/day for 10 years    Types: Cigarettes  . Smokeless tobacco: Never Used     Comment: with + preg  . Alcohol Use: Yes     Comment: social drinker    Allergies: No Known Allergies  Prescriptions prior to admission  Medication Sig Dispense Refill Last Dose  . Acetaminophen 500 MG coapsule Take 1-2 tabs every 4-6 hours as needed for fever and pain 30 capsule 0 12/25/2014 at Unknown time  . fluticasone (FLONASE) 50 MCG/ACT nasal spray Place 2 sprays into both nostrils daily. 16 g 0 PRN  . ibuprofen (ADVIL,MOTRIN) 800 MG tablet Take 1 tablet (800 mg total) by mouth every 8 (eight) hours as needed. 21 tablet 0 12/25/2014 at Unknown time  . permethrin (ELIMITE) 5 % cream Apply to affected area once.  Leave on for 12 hours and then rinse off.  Repeat in one week if needed. 60 g 1     ROS Pertinent ROS in HPI.   All other systems are negative.   Physical Exam   Blood pressure 118/76, pulse 112, temperature 98.2 F (36.8 C), resp. rate 18, height 5\' 5"  (1.651 m), weight 185 lb 9.6 oz (84.188 kg), last menstrual period 02/10/2015, unknown if currently breastfeeding.  Physical Exam  Constitutional: She is oriented to person, place, and time. She appears well-developed and well-nourished.  HENT:  Head: Normocephalic and atraumatic.  Eyes: EOM are normal.  Neck: Normal range of motion.  Cardiovascular: Normal rate.   Respiratory: Effort normal. No respiratory distress.  GI: Soft. She exhibits no distension. There is no tenderness. There is no rebound and no guarding.  Genitourinary:  Mod amt of malodorous white discharge in vault.  External genitalia with erythema of right labia majora (irritation from [place] incident) Bimanual deferred due to discomfort of external genitalia  Musculoskeletal: Normal range of motion.  Neurological: She is alert and oriented to person, place, and time.  Skin: Skin is warm and dry.  Psychiatric: She has a normal mood and affect.   Results for orders placed or performed during the hospital encounter of 03/12/15 (  from the past 24 hour(s))  Urinalysis, Routine w reflex microscopic (not at [facility])     Status: Abnormal   Collection Time: 03/12/15 10:50 PM  Result Value Ref Range   Color, Urine YELLOW YELLOW   APPearance CLOUDY (A) CLEAR   Specific Gravity, Urine 1.010 1.005 - 1.030   pH 6.5 5.0 - 8.0   Glucose, UA NEGATIVE NEGATIVE mg/dL   Hgb urine dipstick NEGATIVE NEGATIVE   Bilirubin Urine NEGATIVE NEGATIVE   Ketones, ur NEGATIVE NEGATIVE mg/dL   Protein, ur NEGATIVE NEGATIVE mg/dL   Urobilinogen, UA 0.2 0.0 - 1.0 mg/dL   Nitrite POSITIVE (A) NEGATIVE   Leukocytes, UA SMALL (A) NEGATIVE  Urine microscopic-add on     Status: Abnormal   Collection Time: 03/12/15 10:50 PM  Result Value Ref Range   Squamous Epithelial / LPF FEW (A) RARE   WBC, UA 11-20 <3  WBC/hpf   Bacteria, UA MANY (A) RARE  Pregnancy, urine POC     Status: None   Collection Time: 03/12/15 11:03 PM  Result Value Ref Range   Preg Test, Ur NEGATIVE NEGATIVE  Wet prep, genital     Status: Abnormal   Collection Time: 03/13/15  3:15 AM  Result Value Ref Range   Yeast Wet Prep HPF POC NONE SEEN NONE SEEN   Trich, Wet Prep NONE SEEN NONE SEEN   Clue Cells Wet Prep HPF POC FEW (A) NONE SEEN   WBC, Wet Prep HPF POC FEW (A) NONE SEEN     MAU Course  Procedures  MDM Wet prep supports clinical diagnosis consistent with BV Pt is not having symptoms consistent with UTI.  Will send for culture.  U/A likely contaminant from vaginal discharge   Assessment and Plan  A: Bacterial vaginosis  P: Discharge to home Flagyl x 1 week No etoh/IC x 10 days F/u HD prn Patient may return to MAU as needed or if her condition were to change or worsen   Paticia Stack 03/13/2015, 3:02 AM

## 2015-03-15 LAB — GC/CHLAMYDIA PROBE AMP (~~LOC~~) NOT AT ARMC
Chlamydia: NEGATIVE
NEISSERIA GONORRHEA: NEGATIVE

## 2017-03-17 ENCOUNTER — Encounter (HOSPITAL_COMMUNITY): Payer: Self-pay | Admitting: Emergency Medicine

## 2017-03-17 ENCOUNTER — Emergency Department (HOSPITAL_COMMUNITY)
Admission: EM | Admit: 2017-03-17 | Discharge: 2017-03-17 | Disposition: A | Discharge status: Home / Self Care | Payer: Medicaid Other | Attending: Emergency Medicine | Admitting: Emergency Medicine

## 2017-03-17 ENCOUNTER — Emergency Department (HOSPITAL_COMMUNITY): Payer: Medicaid Other

## 2017-03-17 DIAGNOSIS — N201 Calculus of ureter: Secondary | ICD-10-CM | POA: Insufficient documentation

## 2017-03-17 DIAGNOSIS — F17210 Nicotine dependence, cigarettes, uncomplicated: Secondary | ICD-10-CM | POA: Insufficient documentation

## 2017-03-17 LAB — URINALYSIS, ROUTINE W REFLEX MICROSCOPIC
Glucose, UA: NEGATIVE mg/dL
Ketones, ur: 80 mg/dL — AB
Leukocytes, UA: NEGATIVE
NITRITE: NEGATIVE
Protein, ur: 30 mg/dL — AB
Specific Gravity, Urine: 1.03 (ref 1.005–1.030)
pH: 6 (ref 5.0–8.0)

## 2017-03-17 LAB — CBC
HCT: 44.9 % (ref 36.0–46.0)
Hemoglobin: 15.9 g/dL — ABNORMAL HIGH (ref 12.0–15.0)
MCH: 31.9 pg (ref 26.0–34.0)
MCHC: 35.4 g/dL (ref 30.0–36.0)
MCV: 90 fL (ref 78.0–100.0)
Platelets: 279 10*3/uL (ref 150–400)
RBC: 4.99 MIL/uL (ref 3.87–5.11)
RDW: 13.2 % (ref 11.5–15.5)
WBC: 14.2 10*3/uL — ABNORMAL HIGH (ref 4.0–10.5)

## 2017-03-17 LAB — LIPASE, BLOOD: Lipase: 23 U/L (ref 11–51)

## 2017-03-17 LAB — COMPREHENSIVE METABOLIC PANEL
ALBUMIN: 5 g/dL (ref 3.5–5.0)
ALK PHOS: 51 U/L (ref 38–126)
ALT: 19 U/L (ref 14–54)
AST: 28 U/L (ref 15–41)
Anion gap: 12 (ref 5–15)
BUN: 20 mg/dL (ref 6–20)
CO2: 22 mmol/L (ref 22–32)
Calcium: 9.6 mg/dL (ref 8.9–10.3)
Chloride: 103 mmol/L (ref 101–111)
Creatinine, Ser: 0.85 mg/dL (ref 0.44–1.00)
GFR calc Af Amer: >60 mL/min (ref >60)
GFR calc non Af Amer: >60 mL/min (ref >60)
Glucose, Bld: 89 mg/dL (ref 65–99)
POTASSIUM: 3.6 mmol/L (ref 3.5–5.1)
Sodium: 137 mmol/L (ref 135–145)
Total Bilirubin: 1.5 mg/dL — ABNORMAL HIGH (ref 0.3–1.2)
Total Protein: 9 g/dL — ABNORMAL HIGH (ref 6.5–8.1)

## 2017-03-17 LAB — I-STAT BETA HCG BLOOD, ED (MC, WL, AP ONLY)

## 2017-03-17 MED ORDER — OXYCODONE-ACETAMINOPHEN 5-325 MG PO TABS: 1.0000 | ORAL_TABLET | Freq: Four times a day (QID) | ORAL | 0 refills | Status: DC | PRN

## 2017-03-17 MED ORDER — FENTANYL CITRATE (PF) 100 MCG/2ML IJ SOLN
100.0000 ug | Freq: Once | INTRAMUSCULAR | Status: AC
  Administered 2017-03-17: 100 ug via INTRAVENOUS
  Filled 2017-03-17: qty 2

## 2017-03-17 MED ORDER — SODIUM CHLORIDE 0.9 % IV BOLUS (SEPSIS)
500.0000 mL | Freq: Once | INTRAVENOUS | Status: AC
  Administered 2017-03-17: 500 mL via INTRAVENOUS

## 2017-03-17 MED ORDER — ONDANSETRON HCL 4 MG/2ML IJ SOLN
4.0000 mg | Freq: Once | INTRAMUSCULAR | Status: AC
  Administered 2017-03-17: 4 mg via INTRAVENOUS
  Filled 2017-03-17: qty 2

## 2017-03-17 MED ORDER — HYDROMORPHONE HCL 1 MG/ML IJ SOLN
INTRAMUSCULAR | Status: AC
  Administered 2017-03-17: 1 mg
  Filled 2017-03-17: qty 1

## 2017-03-17 MED ORDER — ONDANSETRON 4 MG PO TBDP: 4.0000 mg | ORAL_TABLET | Freq: Three times a day (TID) | ORAL | 0 refills | Status: DC | PRN

## 2017-03-17 MED ORDER — KETOROLAC TROMETHAMINE 30 MG/ML IJ SOLN
15.0000 mg | Freq: Once | INTRAMUSCULAR | Status: AC
  Administered 2017-03-17: 15 mg via INTRAVENOUS
  Filled 2017-03-17: qty 1
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
## 2017-03-17 NOTE — ED Notes (Signed)
Pt vomiting blue liquid.

## 2017-03-17 NOTE — ED Triage Notes (Signed)
Pt reports she began to have R flank pain an hour ago. Denies blood in urine or dysuria. Had nausea with PTAR, but no v/d. No vaginal discharge or bleeding. LMP 03/01/17

## 2017-07-04 ENCOUNTER — Inpatient Hospital Stay (HOSPITAL_COMMUNITY)
Admission: AD | Admit: 2017-07-04 | Discharge: 2017-07-04 | Disposition: A | Discharge status: Home / Self Care | Payer: Self-pay | Source: Ambulatory Visit | Attending: Obstetrics & Gynecology | Admitting: Obstetrics & Gynecology

## 2017-07-04 ENCOUNTER — Encounter (HOSPITAL_COMMUNITY): Payer: Self-pay

## 2017-07-04 DIAGNOSIS — F17210 Nicotine dependence, cigarettes, uncomplicated: Secondary | ICD-10-CM | POA: Insufficient documentation

## 2017-07-04 DIAGNOSIS — Z3202 Encounter for pregnancy test, result negative: Secondary | ICD-10-CM | POA: Insufficient documentation

## 2017-07-04 DIAGNOSIS — N760 Acute vaginitis: Secondary | ICD-10-CM

## 2017-07-04 DIAGNOSIS — Z113 Encounter for screening for infections with a predominantly sexual mode of transmission: Secondary | ICD-10-CM

## 2017-07-04 DIAGNOSIS — B9689 Other specified bacterial agents as the cause of diseases classified elsewhere: Secondary | ICD-10-CM

## 2017-07-04 HISTORY — DX: Personal history of other infectious and parasitic diseases: Z86.19

## 2017-07-04 LAB — URINALYSIS, ROUTINE W REFLEX MICROSCOPIC
Bacteria, UA: NONE SEEN
Bilirubin Urine: NEGATIVE
GLUCOSE, UA: NEGATIVE mg/dL
Hgb urine dipstick: NEGATIVE
Ketones, ur: NEGATIVE mg/dL
Nitrite: NEGATIVE
PROTEIN: NEGATIVE mg/dL
Specific Gravity, Urine: 1.016 (ref 1.005–1.030)
pH: 5 (ref 5.0–8.0)

## 2017-07-04 LAB — CBC
HCT: 39.3 % (ref 36.0–46.0)
HEMOGLOBIN: 13.2 g/dL (ref 12.0–15.0)
MCH: 30.8 pg (ref 26.0–34.0)
MCHC: 33.6 g/dL (ref 30.0–36.0)
MCV: 91.8 fL (ref 78.0–100.0)
Platelets: 216 10*3/uL (ref 150–400)
RBC: 4.28 MIL/uL (ref 3.87–5.11)
RDW: 13.2 % (ref 11.5–15.5)
WBC: 9.3 10*3/uL (ref 4.0–10.5)

## 2017-07-04 LAB — WET PREP, GENITAL
Sperm: NONE SEEN
Trich, Wet Prep: NONE SEEN
YEAST WET PREP: NONE SEEN

## 2017-07-04 LAB — GC/CHLAMYDIA PROBE AMP (~~LOC~~) NOT AT ARMC
Chlamydia: POSITIVE — AB
Neisseria Gonorrhea: POSITIVE — AB

## 2017-07-04 LAB — RPR: RPR: NONREACTIVE

## 2017-07-04 LAB — HIV ANTIBODY (ROUTINE TESTING W REFLEX): HIV Screen 4th Generation wRfx: NONREACTIVE

## 2017-07-04 LAB — POCT PREGNANCY, URINE: PREG TEST UR: NEGATIVE

## 2017-07-04 MED ORDER — METRONIDAZOLE 500 MG PO TABS
500.0000 mg | ORAL_TABLET | Freq: Two times a day (BID) | ORAL | 0 refills | Status: DC
Start: 2017-07-04 — End: 2017-11-18

## 2017-07-04 NOTE — MAU Note (Addendum)
Patient states upper right abdominal pain with clear mucous discharge. No bleeding. Patient states she was given Percocet for a hand injury and hasn't had a bowel movement in two days.

## 2017-07-04 NOTE — MAU Provider Note (Signed)
Chief Complaint: Vaginal Discharge and Abdominal Pain   First Provider Initiated Contact with Patient 07/04/17 0108     SUBJECTIVE HPI: Courtney Strong is a 26 y.o. [id number]  who presents to Maternity Admissions reporting abdominal pain & vaginal discharge. Symptoms began 4 days ago. Reports clear mucoid discharge. Denies vaginal bleeding or irritation. Patient is sexually active with 1 partner x 6 months & does not use condoms. Hx of chlamydia & trich w/previous partner. Wants STD testing. Reports postcoital bleeding last week.  Denies fever/chills, n/v/d, constipation, dysuria, or dyspareunia.   Location: abdomen, RLQ Quality: sharp, cramp like Severity: 3/10 on pain scale Modifying factors: worse when sitting upright Has not treated symptoms.   Past Medical History:  Diagnosis Date  . Abnormal Pap smear 2007   ASC-US   . Bacterial vaginosis   . Chlamydia    x3 , treated   . Hx of trichomoniasis 2015  . Migraines   . UTI (lower urinary tract infection)    OB History  Gravida Para Term Preterm AB Living  5 2 2   3 2   SAB TAB Ectopic Multiple Live Births  2 1     2     # Outcome Date GA Lbr Len/2nd Weight Sex Delivery Anes PTL Lv  5 Term 01/19/13 [redacted]w[redacted]d 09:36 / 00:32 7 lb 11.5 oz (3.5 kg) M VBAC EPI  LIV     Birth Comments: No problems at birth  8 SAB 12/2011             Birth Comments: [facility], no complications  3 TAB 48/1856          2 Term 07/09/09 [redacted]w[redacted]d  6 lb 6 oz (2.892 kg) F CS-LTranv  N LIV     Birth Comments: [place], Delivered WHOG, UTi and yeast x1  1 SAB              Past Surgical History:  Procedure Laterality Date  . CESAREAN SECTION     Social History   Socioeconomic History  . Marital status: Single    Spouse name: Not on file  . Number of children: Not on file  . Years of education: Not on file  . Highest education level: Not on file  Social Needs  . Financial resource strain: Not on file  . Food insecurity - worry: Not on file  . Food  insecurity - inability: Not on file  . Transportation needs - medical: Not on file  . Transportation needs - non-medical: Not on file  Occupational History  . Not on file  Tobacco Use  . Smoking status: Current Every Day Smoker    Packs/day: 1.00    Years: 10.00    Pack years: 10.00    Types: Cigarettes  . Smokeless tobacco: Never Used  . Tobacco comment: with + preg  Substance and Sexual Activity  . Alcohol use: Yes    Comment: social drinker  . Drug use: Yes    Types: Marijuana    Comment: e-pill in sept.- did not know she was pregnnt  . Sexual activity: Yes    Birth control/protection: None  Other Topics Concern  . Not on file  Social History Narrative  . Not on file   No current facility-administered medications on file prior to encounter.    No current outpatient medications on file prior to encounter.   No Known Allergies  I have reviewed the past Medical Hx, Surgical Hx, Social Hx, Allergies and Medications.  Review of Systems  Constitutional: Negative.   Gastrointestinal: Positive for abdominal pain.  Genitourinary:       + vaginal discharge + postcoital bleeding    OBJECTIVE Patient Vitals for the past 24 hrs:  BP Temp Temp src Pulse Resp SpO2 Height Weight  07/04/17 0219 127/73 - - (!) 51 14 - - -  07/04/17 0055 136/67 98.2 F (36.8 C) Oral 66 14 100 % 5\' 4"  (1.626 m) 142 lb (64.4 kg)   Constitutional: Well-developed, well-nourished female in no acute distress.  Cardiovascular: normal rate Respiratory: normal rate and effort.  GI: Abd soft, non-tender, gravid appropriate for gestational age. Pos BS x 4 MS: Extremities nontender, no edema, normal ROM Neurologic: Alert and oriented x 4.  GU: Neg CVAT.  SPECULUM EXAM: Small amount of thin tan discharge; foul smell noted. NEFG, no blood noted, cervix clean  BIMANUAL: No CMT, no adnexal masses or tenderness  LAB RESULTS Results for orders placed or performed during the hospital encounter of 07/04/17  (from the past 24 hour(s))  Urinalysis, Routine w reflex microscopic     Status: Abnormal   Collection Time: 07/04/17 12:40 AM  Result Value Ref Range   Color, Urine YELLOW YELLOW   APPearance CLEAR CLEAR   Specific Gravity, Urine 1.016 1.005 - 1.030   pH 5.0 5.0 - 8.0   Glucose, UA NEGATIVE NEGATIVE mg/dL   Hgb urine dipstick NEGATIVE NEGATIVE   Bilirubin Urine NEGATIVE NEGATIVE   Ketones, ur NEGATIVE NEGATIVE mg/dL   Protein, ur NEGATIVE NEGATIVE mg/dL   Nitrite NEGATIVE NEGATIVE   Leukocytes, UA SMALL (A) NEGATIVE   RBC / HPF 0-5 0 - 5 RBC/hpf   WBC, UA 0-5 0 - 5 WBC/hpf   Bacteria, UA NONE SEEN NONE SEEN   Squamous Epithelial / LPF 0-5 (A) NONE SEEN   Mucus PRESENT   Pregnancy, urine POC     Status: None   Collection Time: 07/04/17  1:20 AM  Result Value Ref Range   Preg Test, Ur NEGATIVE NEGATIVE  Wet prep, genital     Status: Abnormal   Collection Time: 07/04/17  1:24 AM  Result Value Ref Range   Yeast Wet Prep HPF POC NONE SEEN NONE SEEN   Trich, Wet Prep NONE SEEN NONE SEEN   Clue Cells Wet Prep HPF POC PRESENT (A) NONE SEEN   WBC, Wet Prep HPF POC MANY (A) NONE SEEN   Sperm NONE SEEN   CBC     Status: None   Collection Time: 07/04/17  1:36 AM  Result Value Ref Range   WBC 9.3 4.0 - 10.5 K/uL   RBC 4.28 3.87 - 5.11 MIL/uL   Hemoglobin 13.2 12.0 - 15.0 g/dL   HCT 39.3 36.0 - 46.0 %   MCV 91.8 78.0 - 100.0 fL   MCH 30.8 26.0 - 34.0 pg   MCHC 33.6 30.0 - 36.0 g/dL   RDW 13.2 11.5 - 15.5 %   Platelets 216 150 - 400 K/uL    IMAGING No results found.  MAU COURSE UPT negative CBC, GC/CT, HIV, RPR, wet prep  MDM No leukocytosis & afebrile. No tenderness on exam & adnexal masses appreciated during bimanual.    ASSESSMENT 1. BV (bacterial vaginosis)   2. Screen for STD (sexually transmitted disease)   3. Pregnancy examination or test, negative result     PLAN Discharge home in stable condition. Rx flagyl GC/CT, HIV, RPR pending Discussed reasons to  return to MAU  Allergies as of  07/04/2017   No Known Allergies     Medication List    STOP taking these medications   Acetaminophen 500 MG coapsule   fluticasone 50 MCG/ACT nasal spray Commonly known as:  FLONASE   ibuprofen 800 MG tablet Commonly known as:  ADVIL,MOTRIN   ondansetron 4 MG disintegrating tablet Commonly known as:  ZOFRAN-ODT   oxyCODONE-acetaminophen 5-325 MG tablet Commonly known as:  PERCOCET/ROXICET     TAKE these medications   metroNIDAZOLE 500 MG tablet Commonly known as:  FLAGYL Take 1 tablet (500 mg total) by mouth 2 (two) times daily.        Jorje Guild, NP 07/04/2017  3:01 AM

## 2017-07-04 NOTE — Discharge Instructions (Signed)
Bacterial Vaginosis Bacterial vaginosis is a vaginal infection that occurs when the normal balance of bacteria in the vagina is disrupted. It results from an overgrowth of certain bacteria. This is the most common vaginal infection among women ages 15-44. Because bacterial vaginosis increases your risk for STIs (sexually transmitted infections), getting treated can help reduce your risk for chlamydia, gonorrhea, herpes, and HIV (human immunodeficiency virus). Treatment is also important for preventing complications in pregnant women, because this condition can cause an early (premature) delivery. What are the causes? This condition is caused by an increase in harmful bacteria that are normally present in small amounts in the vagina. However, the reason that the condition develops is not fully understood. What increases the risk? The following factors may make you more likely to develop this condition:  Having a new sexual partner or multiple sexual partners.  Having unprotected sex.  Douching.  Having an intrauterine device (IUD).  Smoking.  Drug and alcohol abuse.  Taking certain antibiotic medicines.  Being pregnant.  You cannot get bacterial vaginosis from toilet seats, bedding, swimming pools, or contact with objects around you. What are the signs or symptoms? Symptoms of this condition include:  Grey or white vaginal discharge. The discharge can also be watery or foamy.  A fish-like odor with discharge, especially after sexual intercourse or during menstruation.  Itching in and around the vagina.  Burning or pain with urination.  Some women with bacterial vaginosis have no signs or symptoms. How is this diagnosed? This condition is diagnosed based on:  Your medical history.  A physical exam of the vagina.  Testing a sample of vaginal fluid under a microscope to look for a large amount of bad bacteria or abnormal cells. Your health care provider may use a cotton swab  or a small wooden spatula to collect the sample.  How is this treated? This condition is treated with antibiotics. These may be given as a pill, a vaginal cream, or a medicine that is put into the vagina (suppository). If the condition comes back after treatment, a second round of antibiotics may be needed. Follow these instructions at home: Medicines  Take over-the-counter and prescription medicines only as told by your health care provider.  Take or use your antibiotic as told by your health care provider. Do not stop taking or using the antibiotic even if you start to feel better. General instructions  If you have a female sexual partner, tell her that you have a vaginal infection. She should see her health care provider and be treated if she has symptoms. If you have a female sexual partner, he does not need treatment.  During treatment: ? Avoid sexual activity until you finish treatment. ? Do not douche. ? Avoid alcohol as directed by your health care provider. ? Avoid breastfeeding as directed by your health care provider.  Drink enough water and fluids to keep your urine clear or pale yellow.  Keep the area around your vagina and rectum clean. ? Wash the area daily with warm water. ? Wipe yourself from front to back after using the toilet.  Keep all follow-up visits as told by your health care provider. This is important. How is this prevented?  Do not douche.  Wash the outside of your vagina with warm water only.  Use protection when having sex. This includes latex condoms and dental dams.  Limit how many sexual partners you have. To help prevent bacterial vaginosis, it is best to have sex with just   one partner (monogamous).  Make sure you and your sexual partner are tested for STIs.  Wear cotton or cotton-lined underwear.  Avoid wearing tight pants and pantyhose, especially during summer.  Limit the amount of alcohol that you drink.  Do not use any products that  contain nicotine or tobacco, such as cigarettes and e-cigarettes. If you need help quitting, ask your health care provider.  Do not use illegal drugs. Where to find more information:  Centers for Disease Control and Prevention: www.cdc.gov/std  American Sexual Health Association (ASHA): www.ashastd.org  U.S. Department of Health and Human Services, Office on Women's Health: www.womenshealth.gov/ or https://www.womenshealth.gov/a-z-topics/bacterial-vaginosis Contact a health care provider if:  Your symptoms do not improve, even after treatment.  You have more discharge or pain when urinating.  You have a fever.  You have pain in your abdomen.  You have pain during sex.  You have vaginal bleeding between periods. Summary  Bacterial vaginosis is a vaginal infection that occurs when the normal balance of bacteria in the vagina is disrupted.  Because bacterial vaginosis increases your risk for STIs (sexually transmitted infections), getting treated can help reduce your risk for chlamydia, gonorrhea, herpes, and HIV (human immunodeficiency virus). Treatment is also important for preventing complications in pregnant women, because the condition can cause an early (premature) delivery.  This condition is treated with antibiotic medicines. These may be given as a pill, a vaginal cream, or a medicine that is put into the vagina (suppository). This information is not intended to replace advice given to you by your health care provider. Make sure you discuss any questions you have with your health care provider. Document Released: 07/10/2005 Document Revised: 03/25/2016 Document Reviewed: 03/25/2016 Elsevier Interactive Patient Education  2017 Elsevier Inc.  

## 2017-07-05 ENCOUNTER — Encounter (HOSPITAL_COMMUNITY): Payer: Self-pay | Admitting: *Deleted

## 2017-07-05 ENCOUNTER — Inpatient Hospital Stay (HOSPITAL_COMMUNITY)
Admission: AD | Admit: 2017-07-05 | Discharge: 2017-07-05 | Disposition: A | Discharge status: Home / Self Care | Payer: Self-pay | Source: Ambulatory Visit | Attending: Obstetrics and Gynecology | Admitting: Obstetrics and Gynecology

## 2017-07-05 DIAGNOSIS — A5402 Gonococcal vulvovaginitis, unspecified: Secondary | ICD-10-CM | POA: Insufficient documentation

## 2017-07-05 DIAGNOSIS — N760 Acute vaginitis: Secondary | ICD-10-CM | POA: Insufficient documentation

## 2017-07-05 DIAGNOSIS — A568 Sexually transmitted chlamydial infection of other sites: Secondary | ICD-10-CM | POA: Insufficient documentation

## 2017-07-05 DIAGNOSIS — A549 Gonococcal infection, unspecified: Secondary | ICD-10-CM

## 2017-07-05 DIAGNOSIS — A64 Unspecified sexually transmitted disease: Secondary | ICD-10-CM

## 2017-07-05 DIAGNOSIS — A749 Chlamydial infection, unspecified: Secondary | ICD-10-CM

## 2017-07-05 DIAGNOSIS — B9689 Other specified bacterial agents as the cause of diseases classified elsewhere: Secondary | ICD-10-CM | POA: Insufficient documentation

## 2017-07-05 MED ORDER — AZITHROMYCIN 250 MG PO TABS
1000.0000 mg | ORAL_TABLET | Freq: Once | ORAL | Status: AC
  Administered 2017-07-05: 1000 mg via ORAL
  Filled 2017-07-05: qty 4

## 2017-07-05 MED ORDER — CEFTRIAXONE SODIUM 250 MG IJ SOLR
250.0000 mg | Freq: Once | INTRAMUSCULAR | Status: AC
  Administered 2017-07-05: 250 mg via INTRAMUSCULAR
  Filled 2017-07-05: qty 250

## 2017-07-05 NOTE — MAU Note (Signed)
Pt states she was here yesterday and was told she tested positive for gonorrhea and chlamydia and that she was supposed to return today to get treated. Still having some discomfort and and discharge.

## 2017-07-05 NOTE — MAU Provider Note (Signed)
Ms. Courtney Strong is a 26 y.o. who presents to MAU today for follow-up STD test results and treatment. Was notified to come in for treatment.    BP 129/76 (BP Location: Right Arm)   Pulse 99   Resp 16   SpO2 100%  CONSTITUTIONAL: Well-developed, well-nourished female in no acute distress.  ENT: External right and left ear normal.  EYES: EOM intact, conjunctivae normal.  MUSCULOSKELETAL: Normal range of motion.  CARDIOVASCULAR: Regular heart rate RESPIRATORY: Normal effort NEUROLOGICAL: Alert and oriented to person, place, and time.  SKIN: Skin is warm and dry. No rash noted. Not diaphoretic. No erythema. No pallor. PSYCH: Normal mood and affect. Normal behavior. Normal judgment and thought content.   A: Sexually transmitted disease (STD)  Gonorrhea  Chlamydia  BV (bacterial vaginosis)   P: Discharge home Received Ceftriaxone and Azithromycin in MAU Continue Flagyl for BV Instructed to notify partner so he can be treated; expedited partner treatment given Patient may return to MAU as needed or if her condition were to change or worsen   Luiz Blare, DO OB Fellow

## 2017-07-05 NOTE — Discharge Instructions (Signed)

## 2017-07-24 NOTE — L&D Delivery Note (Signed)
Patient: Courtney Strong MRN: [id number]  GBS status: Negative  Patient is a 27 y.o. now [id number] s/p NSVD at [redacted]w[redacted]d, who was admitted for IOL 2/2 2 vessel cord. AROM 1h 50m prior to delivery with scant clear fluid.   Delivery Note At 11:19 PM a healthy female was delivered via VBAC, Spontaneous (Presentation: OA).  APGAR: 8, 9; weight pending. Placenta status: Intact: with the following complications: 2 vessel cord.  Cord pH: N/A  Anesthesia:  Epidural Episiotomy: None Lacerations: Sulcus Suture Repair: 3.0 vicryl Est. Blood Loss (mL): 351mL    Mom to postpartum.  Baby to Couplet care / Skin to Skin.  Patient was delivered by Carilyn Goodpasture, MS-3. I was present and gloved for the entire delivery.   Phill Myron, D.O. [facility] Fellow, [facility] for [facility] 07/09/2018, 12:30 AM

## 2017-11-18 ENCOUNTER — Inpatient Hospital Stay (HOSPITAL_COMMUNITY)
Admission: AD | Admit: 2017-11-18 | Discharge: 2017-11-18 | Disposition: A | Discharge status: Home / Self Care | Payer: Self-pay | Source: Ambulatory Visit | Attending: Obstetrics and Gynecology | Admitting: Obstetrics and Gynecology

## 2017-11-18 ENCOUNTER — Encounter (HOSPITAL_COMMUNITY): Payer: Self-pay | Admitting: *Deleted

## 2017-11-18 DIAGNOSIS — O99331 Smoking (tobacco) complicating pregnancy, first trimester: Secondary | ICD-10-CM | POA: Insufficient documentation

## 2017-11-18 DIAGNOSIS — A084 Viral intestinal infection, unspecified: Secondary | ICD-10-CM

## 2017-11-18 DIAGNOSIS — O210 Mild hyperemesis gravidarum: Secondary | ICD-10-CM | POA: Insufficient documentation

## 2017-11-18 DIAGNOSIS — O99611 Diseases of the digestive system complicating pregnancy, first trimester: Secondary | ICD-10-CM | POA: Insufficient documentation

## 2017-11-18 DIAGNOSIS — Z3A01 Less than 8 weeks gestation of pregnancy: Secondary | ICD-10-CM

## 2017-11-18 DIAGNOSIS — F17210 Nicotine dependence, cigarettes, uncomplicated: Secondary | ICD-10-CM | POA: Insufficient documentation

## 2017-11-18 DIAGNOSIS — O219 Vomiting of pregnancy, unspecified: Secondary | ICD-10-CM

## 2017-11-18 LAB — URINALYSIS, ROUTINE W REFLEX MICROSCOPIC
BILIRUBIN URINE: NEGATIVE
Glucose, UA: NEGATIVE mg/dL
HGB URINE DIPSTICK: NEGATIVE
KETONES UR: NEGATIVE mg/dL
Nitrite: NEGATIVE
PH: 6 (ref 5.0–8.0)
Protein, ur: NEGATIVE mg/dL
Specific Gravity, Urine: 1.01 (ref 1.005–1.030)

## 2017-11-18 LAB — POCT PREGNANCY, URINE: PREG TEST UR: POSITIVE — AB

## 2017-11-18 MED ORDER — ONDANSETRON 8 MG PO TBDP
8.0000 mg | ORAL_TABLET | Freq: Once | ORAL | Status: AC
  Administered 2017-11-18: 8 mg via ORAL
  Filled 2017-11-18: qty 1

## 2017-11-18 MED ORDER — PROMETHAZINE HCL 25 MG PO TABS: 12.5000 mg | ORAL_TABLET | Freq: Four times a day (QID) | ORAL | 5 refills | Status: DC | PRN

## 2017-11-18 NOTE — MAU Provider Note (Signed)
History     CSN: [id number]  Arrival date and time: 11/18/17 1740   First Provider Initiated Contact with Patient 11/18/17 1000      Chief Complaint  Patient presents with  . Diarrhea  . Emesis   Courtney Strong is a 27 y.o. [id number] at [redacted]w[redacted]d  who presents today with nausea/vomiting/diarrhea since around 2100 last night.   Diarrhea   This is a new problem. The current episode started yesterday. The problem occurs 5 to 10 times per day. The problem has been unchanged. The stool consistency is described as watery. Associated symptoms include vomiting. Pertinent negatives include no chills or fever. Risk factors: "I have been around the neighbor's kids, and they are always sick" "I also ate at [place] last night, boyfriend at with me. He isn't sick"  Emesis   This is a new problem. The current episode started yesterday. The problem occurs 5 to 10 times per day. The problem has been unchanged. The emesis has an appearance of stomach contents. There has been no fever. Associated symptoms include diarrhea. Pertinent negatives include no chills or fever.      Past Medical History:  Diagnosis Date  . Abnormal Pap smear 2007   ASC-US   . Bacterial vaginosis   . Chlamydia    x3 , treated   . Hx of trichomoniasis 2015  . Migraines   . UTI (lower urinary tract infection)     Past Surgical History:  Procedure Laterality Date  . CESAREAN SECTION      Family History  Problem Relation Age of Onset  . Other Neg Hx   . Hearing loss Neg Hx     Social History   Tobacco Use  . Smoking status: Current Every Day Smoker    Packs/day: 1.00    Years: 10.00    Pack years: 10.00    Types: Cigarettes  . Smokeless tobacco: Never Used  . Tobacco comment: trying to quit  Substance Use Topics  . Alcohol use: Not Currently    Comment: social drinker  . Drug use: Not Currently    Allergies: No Known Allergies  Medications Prior to Admission  Medication Sig Dispense Refill  Last Dose  . metroNIDAZOLE (FLAGYL) 500 MG tablet Take 1 tablet (500 mg total) by mouth 2 (two) times daily. 14 tablet 0     Review of Systems  Constitutional: Negative for chills and fever.  Gastrointestinal: Positive for diarrhea and vomiting.  Genitourinary: Negative for pelvic pain and vaginal bleeding.   Physical Exam   Blood pressure 114/63, pulse 79, temperature 98.1 F (36.7 C), temperature source Oral, resp. rate 16, last menstrual period 09/07/2017, unknown if currently breastfeeding.  Physical Exam  Nursing note and vitals reviewed. Constitutional: She is oriented to person, place, and time. She appears well-developed and well-nourished. No distress.  HENT:  Head: Normocephalic.  Cardiovascular: Normal rate.  Respiratory: Effort normal.  GI: Soft. There is no tenderness. There is no rebound.  Neurological: She is alert and oriented to person, place, and time.  Skin: Skin is warm and dry.  Psychiatric: She has a normal mood and affect.    Results for orders placed or performed during the hospital encounter of 11/18/17 (from the past 24 hour(s))  Urinalysis, Routine w reflex microscopic     Status: Abnormal   Collection Time: 11/18/17  9:30 AM  Result Value Ref Range   Color, Urine YELLOW YELLOW   APPearance CLOUDY (A) CLEAR   Specific Gravity, Urine  1.010 1.005 - 1.030   pH 6.0 5.0 - 8.0   Glucose, UA NEGATIVE NEGATIVE mg/dL   Hgb urine dipstick NEGATIVE NEGATIVE   Bilirubin Urine NEGATIVE NEGATIVE   Ketones, ur NEGATIVE NEGATIVE mg/dL   Protein, ur NEGATIVE NEGATIVE mg/dL   Nitrite NEGATIVE NEGATIVE   Leukocytes, UA MODERATE (A) NEGATIVE   RBC / HPF 6-10 0 - 5 RBC/hpf   WBC, UA 0-5 0 - 5 WBC/hpf   Bacteria, UA RARE (A) NONE SEEN   Squamous Epithelial / LPF 21-50 0 - 5   Mucus PRESENT   Pregnancy, urine POC     Status: Abnormal   Collection Time: 11/18/17  9:58 AM  Result Value Ref Range   Preg Test, Ur POSITIVE (A) NEGATIVE    MAU Course   Procedures  MDM Patient has had 8mg  ODT Zofran. No vomiting or diarrhea since being here. She is tolerating PO at this time.    Assessment and Plan   1. Nausea/vomiting in pregnancy   2. Viral gastroenteritis   3. [redacted] weeks gestation of pregnancy    DC home Comfort measures reviewed  1st Trimester precautions  RX: phenergan PRN #30 with 5 RF Return to MAU as needed Start [facility] as soon as possible   Follow-up Information    Department, [facility] Follow up.   Contact information: [place] [address] [phone]            Marcille Buffy 11/18/2017, 10:02 AM

## 2017-11-18 NOTE — MAU Note (Signed)
Patient presents with diarrhea and vomiting since 9pm last night.  Reports diarrhea x2/hr all night.

## 2017-11-18 NOTE — Discharge Instructions (Signed)
Prenatal Care Providers [facility]  [phone]     Phone: [place]                      [facility]  @[place]  [place]     Phone: [phone]  Phone: [place]     Phone: [phone]  Phone: [facility] [place]                hone: [phone]  Phone: [phone]         [facility] [address]      Phone: [phone]  Phone: [phone]         [place] [facility]                Phone: [phone]  [place])          Phone: [phone] [facility]   Phone: [phone] Safe Medications in Pregnancy   Acne: Benzoyl Peroxide Salicylic Acid  Backache/Headache: Tylenol: 2 regular strength every 4 hours OR              2 Extra strength every 6 hours  Colds/Coughs/Allergies: Benadryl (alcohol free) 25 mg every 6 hours as needed Breath right strips Claritin Cepacol throat lozenges Chloraseptic throat spray Cold-Eeze- up to three times per day Cough drops, alcohol free Flonase (by prescription only) Guaifenesin Mucinex Robitussin DM (plain only, alcohol free) Saline nasal spray/drops Sudafed (pseudoephedrine) & Actifed ** use only after [redacted] weeks gestation and if you do not have high blood pressure Tylenol Vicks Vaporub Zinc lozenges Zyrtec   Constipation: Colace Ducolax suppositories Fleet enema Glycerin suppositories Metamucil Milk of magnesia Miralax Senokot Smooth move tea  Diarrhea: Kaopectate Imodium A-D  *NO pepto Bismol  Hemorrhoids: Anusol Anusol HC Preparation H Tucks  Indigestion: Tums Maalox Mylanta Zantac  Pepcid  Insomnia: Benadryl (alcohol free) 25mg  every 6 hours as needed Tylenol  PM Unisom, no Gelcaps  Leg Cramps: Tums MagGel  Nausea/Vomiting:  Bonine Dramamine Emetrol Ginger extract Sea bands Meclizine  Nausea medication to take during pregnancy:  Unisom (doxylamine succinate 25 mg tablets) Take one tablet daily at bedtime. If symptoms are not adequately controlled, the dose can be increased to a maximum recommended dose of two tablets daily (1/2 tablet in the morning, 1/2 tablet mid-afternoon and one at bedtime). Vitamin B6 100mg  tablets. Take one tablet twice a day (up to 200 mg per day).  Skin Rashes: Aveeno products Benadryl cream or 25mg  every 6 hours as needed Calamine Lotion 1% cortisone cream  Yeast infection: Gyne-lotrimin 7 Monistat 7   **If taking multiple medications, please check labels to avoid duplicating the same active ingredients **take medication as directed on the label ** Do not exceed 4000 mg of tylenol in 24 hours **Do not take medications that contain aspirin or ibuprofen

## 2017-11-18 NOTE — MAU Note (Signed)
Urine in lab 

## 2017-12-23 ENCOUNTER — Inpatient Hospital Stay (HOSPITAL_COMMUNITY)
Admission: AD | Admit: 2017-12-23 | Discharge: 2017-12-24 | Disposition: A | Discharge status: Home / Self Care | Payer: Medicaid Other | Source: Ambulatory Visit | Attending: Family Medicine | Admitting: Family Medicine

## 2017-12-23 DIAGNOSIS — B373 Candidiasis of vulva and vagina: Secondary | ICD-10-CM | POA: Diagnosis not present

## 2017-12-23 DIAGNOSIS — O99331 Smoking (tobacco) complicating pregnancy, first trimester: Secondary | ICD-10-CM | POA: Diagnosis not present

## 2017-12-23 DIAGNOSIS — N898 Other specified noninflammatory disorders of vagina: Secondary | ICD-10-CM | POA: Diagnosis not present

## 2017-12-23 DIAGNOSIS — F17210 Nicotine dependence, cigarettes, uncomplicated: Secondary | ICD-10-CM | POA: Diagnosis not present

## 2017-12-23 DIAGNOSIS — Z79899 Other long term (current) drug therapy: Secondary | ICD-10-CM | POA: Diagnosis not present

## 2017-12-23 DIAGNOSIS — O219 Vomiting of pregnancy, unspecified: Secondary | ICD-10-CM | POA: Diagnosis not present

## 2017-12-23 DIAGNOSIS — B3731 Acute candidiasis of vulva and vagina: Secondary | ICD-10-CM

## 2017-12-23 DIAGNOSIS — Z3A11 11 weeks gestation of pregnancy: Secondary | ICD-10-CM | POA: Insufficient documentation

## 2017-12-23 DIAGNOSIS — O98811 Other maternal infectious and parasitic diseases complicating pregnancy, first trimester: Secondary | ICD-10-CM | POA: Insufficient documentation

## 2017-12-23 DIAGNOSIS — O26891 Other specified pregnancy related conditions, first trimester: Secondary | ICD-10-CM

## 2017-12-23 LAB — URINALYSIS, ROUTINE W REFLEX MICROSCOPIC
Bacteria, UA: NONE SEEN
Bilirubin Urine: NEGATIVE
Glucose, UA: NEGATIVE mg/dL
Hgb urine dipstick: NEGATIVE
Ketones, ur: NEGATIVE mg/dL
Nitrite: NEGATIVE
Protein, ur: NEGATIVE mg/dL
Specific Gravity, Urine: 1.023 (ref 1.005–1.030)
pH: 5 (ref 5.0–8.0)

## 2017-12-23 NOTE — MAU Note (Signed)
Pt complains of vaginal discharge. Has had it since April. States she was here and didn't have any swabs done. States the discharge does not have an odor. Denies vaginal bleeding. Had gonorrhea and chlamydia in November.

## 2017-12-24 DIAGNOSIS — O26891 Other specified pregnancy related conditions, first trimester: Secondary | ICD-10-CM

## 2017-12-24 DIAGNOSIS — B373 Candidiasis of vulva and vagina: Secondary | ICD-10-CM

## 2017-12-24 DIAGNOSIS — N898 Other specified noninflammatory disorders of vagina: Secondary | ICD-10-CM

## 2017-12-24 LAB — GC/CHLAMYDIA PROBE AMP (~~LOC~~) NOT AT ARMC
Chlamydia: NEGATIVE
Neisseria Gonorrhea: NEGATIVE

## 2017-12-24 LAB — WET PREP, GENITAL
Sperm: NONE SEEN
Trich, Wet Prep: NONE SEEN

## 2017-12-24 MED ORDER — ONDANSETRON 4 MG PO TBDP: 4.0000 mg | ORAL_TABLET | Freq: Three times a day (TID) | ORAL | 3 refills | Status: DC | PRN

## 2017-12-24 MED ORDER — TERCONAZOLE 0.8 % VA CREA: 1.0000 | TOPICAL_CREAM | Freq: Every day | VAGINAL | 0 refills | Status: DC

## 2017-12-24 NOTE — MAU Provider Note (Signed)
Chief Complaint: Vaginal Discharge   First Provider Initiated Contact with Patient 12/23/17 2344      SUBJECTIVE HPI: Courtney Strong is a 27 y.o. [id number] at [redacted]w[redacted]d by LMP who presents to maternity admissions reporting vaginal discharge. She reports vaginal discharge that has been present since April. She reports yellow discharge with no odor, was seen in April for the same symptoms but reports no swabs were done that day. She reports vaginal itching and burning is associated with discharge, she has not tried any medication for treatment. She reports a history of gonorrhea and chlamydia in November- reports being treated and partner being treated, same partner during this pregnancy. She denies vaginal bleeding, urinary symptoms, h/a, dizziness, n/v, or fever/chills.    Past Medical History:  Diagnosis Date  . Abnormal Pap smear 2007   ASC-US   . Bacterial vaginosis   . Chlamydia    x3 , treated   . Hx of trichomoniasis 2015  . Migraines   . UTI (lower urinary tract infection)    Past Surgical History:  Procedure Laterality Date  . CESAREAN SECTION     Social History   Socioeconomic History  . Marital status: Single    Spouse name: Not on file  . Number of children: Not on file  . Years of education: Not on file  . Highest education level: Not on file  Occupational History  . Not on file  Social Needs  . Financial resource strain: Not on file  . Food insecurity:    Worry: Not on file    Inability: Not on file  . Transportation needs:    Medical: Not on file    Non-medical: Not on file  Tobacco Use  . Smoking status: Current Every Day Smoker    Packs/day: 1.00    Years: 10.00    Pack years: 10.00    Types: Cigarettes  . Smokeless tobacco: Never Used  . Tobacco comment: trying to quit  Substance and Sexual Activity  . Alcohol use: Not Currently    Comment: social drinker  . Drug use: Not Currently  . Sexual activity: Yes    Birth control/protection: None   Lifestyle  . Physical activity:    Days per week: Not on file    Minutes per session: Not on file  . Stress: Not on file  Relationships  . Social connections:    Talks on phone: Not on file    Gets together: Not on file    Attends religious service: Not on file    Active member of club or organization: Not on file    Attends meetings of clubs or organizations: Not on file    Relationship status: Not on file  . Intimate partner violence:    Fear of current or ex partner: Not on file    Emotionally abused: Not on file    Physically abused: Not on file    Forced sexual activity: Not on file  Other Topics Concern  . Not on file  Social History Narrative  . Not on file   No current facility-administered medications on file prior to encounter.    Current Outpatient Medications on File Prior to Encounter  Medication Sig Dispense Refill  . Prenatal Vit-Fe Fumarate-FA (PRENATAL MULTIVITAMIN) TABS tablet Take 1 tablet by mouth daily at 12 noon.    . promethazine (PHENERGAN) 25 MG tablet Take 0.5-1 tablets (12.5-25 mg total) by mouth every 6 (six) hours as needed. 30 tablet 5   No  Known Allergies  ROS:  Review of Systems  Constitutional: Negative.   Gastrointestinal: Negative.   Genitourinary: Positive for vaginal discharge. Negative for difficulty urinating, dysuria, frequency, pelvic pain, urgency and vaginal bleeding.       Vaginal itching    I have reviewed patient's Past Medical Hx, Surgical Hx, Family Hx, Social Hx, medications and allergies.   Physical Exam   Patient Vitals for the past 24 hrs:  BP Temp Temp src Pulse Resp SpO2 Height Weight  12/24/17 0122 108/62 - - 74 18 - - -  12/23/17 2322 114/78 98.3 F (36.8 C) Oral 85 20 100 % 5\' 4"  (1.626 m) 133 lb (60.3 kg)   Constitutional: Well-developed, well-nourished female in no acute distress.  Cardiovascular: normal rate Respiratory: normal effort Neurologic: Alert and oriented x 4.  PELVIC EXAM: blind swabs  obtained   FHT 168 by doppler  LAB RESULTS Results for orders placed or performed during the hospital encounter of 12/23/17 (from the past 24 hour(s))  Wet prep, genital     Status: Abnormal   Collection Time: 12/23/17 11:28 PM  Result Value Ref Range   Yeast Wet Prep HPF POC PRESENT (A) NONE SEEN   Trich, Wet Prep NONE SEEN NONE SEEN   Clue Cells Wet Prep HPF POC PRESENT (A) NONE SEEN   WBC, Wet Prep HPF POC MODERATE (A) NONE SEEN   Sperm NONE SEEN   Urinalysis, Routine w reflex microscopic     Status: Abnormal   Collection Time: 12/23/17 11:28 PM  Result Value Ref Range   Color, Urine YELLOW YELLOW   APPearance HAZY (A) CLEAR   Specific Gravity, Urine 1.023 1.005 - 1.030   pH 5.0 5.0 - 8.0   Glucose, UA NEGATIVE NEGATIVE mg/dL   Hgb urine dipstick NEGATIVE NEGATIVE   Bilirubin Urine NEGATIVE NEGATIVE   Ketones, ur NEGATIVE NEGATIVE mg/dL   Protein, ur NEGATIVE NEGATIVE mg/dL   Nitrite NEGATIVE NEGATIVE   Leukocytes, UA SMALL (A) NEGATIVE   RBC / HPF 0-5 0 - 5 RBC/hpf   WBC, UA 21-50 0 - 5 WBC/hpf   Bacteria, UA NONE SEEN NONE SEEN   Squamous Epithelial / LPF 0-5 0 - 5   Mucus PRESENT     MAU Management/MDM: Orders Placed This Encounter  Procedures  . Wet prep, genital  . Urinalysis, Routine w reflex microscopic  . Discharge patient Discharge disposition: 01-Home or Self Care; Discharge patient date: 12/24/2017   Wet prep- positive for yeast and clue cells  UA- negative   Meds ordered this encounter  Medications  . terconazole (TERAZOL 3) 0.8 % vaginal cream    Sig: Place 1 applicator vaginally at bedtime.    Dispense:  20 g    Refill:  0  . ondansetron (ZOFRAN ODT) 4 MG disintegrating tablet    Sig: Take 1 tablet (4 mg total) by mouth every 8 (eight) hours as needed for nausea or vomiting.    Dispense:  30 tablet    Refill:  3   Pt discharged. Discussed reasons to return to MAU. Rx sent to pharmacy of choice. Pt request refill of N/V medication that doesn't  make her sleepy. Rx for Zofran.    ASSESSMENT 1. Vaginal yeast infection   2. Vaginal discharge during pregnancy in first trimester   3. Nausea and vomiting during pregnancy     PLAN Discharge home F/u as scheduled for prenatal appointments  Rx for Zofran and Terazol  Return to MAU as needed for  emergencies   Follow-up [place] ADMISSIONS Follow up.   Why:  Return to MAU as needed for emergencies. Call the office to be seen for prenatal care  Contact information: [address] [phone] [place] [phone]          Allergies as of 12/24/2017   No Known Allergies     Medication List    TAKE these medications   ondansetron 4 MG disintegrating tablet Commonly known as:  ZOFRAN ODT Take 1 tablet (4 mg total) by mouth every 8 (eight) hours as needed for nausea or vomiting.   prenatal multivitamin Tabs tablet Take 1 tablet by mouth daily at 12 noon.   promethazine 25 MG tablet Commonly known as:  PHENERGAN Take 0.5-1 tablets (12.5-25 mg total) by mouth every 6 (six) hours as needed.   terconazole 0.8 % vaginal cream Commonly known as:  TERAZOL 3 Place 1 applicator vaginally at bedtime.      Darrol Poke  Certified Nurse-Midwife 12/24/2017  4:46 AM

## 2018-01-16 ENCOUNTER — Encounter: Payer: Self-pay | Admitting: Family Medicine

## 2018-01-16 ENCOUNTER — Ambulatory Visit (INDEPENDENT_AMBULATORY_CARE_PROVIDER_SITE_OTHER): Payer: Medicaid Other | Admitting: Family Medicine

## 2018-01-16 ENCOUNTER — Other Ambulatory Visit (HOSPITAL_COMMUNITY)
Admission: RE | Admit: 2018-01-16 | Discharge: 2018-01-16 | Disposition: A | Discharge status: Home / Self Care | Payer: Medicaid Other | Source: Ambulatory Visit | Attending: Family Medicine | Admitting: Family Medicine

## 2018-01-16 DIAGNOSIS — Z3400 Encounter for supervision of normal first pregnancy, unspecified trimester: Secondary | ICD-10-CM | POA: Diagnosis present

## 2018-01-16 DIAGNOSIS — Z3480 Encounter for supervision of other normal pregnancy, unspecified trimester: Secondary | ICD-10-CM | POA: Insufficient documentation

## 2018-01-16 DIAGNOSIS — Z3482 Encounter for supervision of other normal pregnancy, second trimester: Secondary | ICD-10-CM | POA: Diagnosis not present

## 2018-01-16 DIAGNOSIS — R760 Raised antibody titer: Secondary | ICD-10-CM

## 2018-01-16 DIAGNOSIS — Z3687 Encounter for antenatal screening for uncertain dates: Secondary | ICD-10-CM

## 2018-01-16 DIAGNOSIS — Z3402 Encounter for supervision of normal first pregnancy, second trimester: Secondary | ICD-10-CM | POA: Insufficient documentation

## 2018-01-16 DIAGNOSIS — O34219 Maternal care for unspecified type scar from previous cesarean delivery: Secondary | ICD-10-CM

## 2018-01-16 NOTE — Progress Notes (Signed)
DATING AND VIABILITY SONOGRAM   Courtney Strong is a 27 y.o. year old G5P2022 with LMP Patient's last menstrual period was 10/05/2017 (exact date). which would correlate to  [redacted]w[redacted]d weeks gestation.  She has regular menstrual cycles.   She is here today for a confirmatory initial sonogram.    GESTATION: SINGLETON     FETAL ACTIVITY:          Heart rate        146          The fetus is active.    GESTATIONAL AGE AND  BIOMETRICS:  Gestational criteria: Estimated Date of Delivery: 07/12/18 by LMP now at [redacted]w[redacted]d  Previous Scans:0      Femur length 1.44 cm 14-2 weeks                                                                               AVERAGE EGA(BY THIS SCAN): 14-2 weeks  WORKING EDD( LMP ):  07-12-18      Kathrene Alu 01/16/2018 4:02 PM
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
## 2018-01-16 NOTE — Patient Instructions (Signed)

## 2018-01-18 LAB — CYTOLOGY - PAP
Chlamydia: NEGATIVE
Diagnosis: NEGATIVE
Neisseria Gonorrhea: NEGATIVE

## 2018-01-18 LAB — URINE CULTURE: Organism ID, Bacteria: NO GROWTH

## 2018-01-18 LAB — SPECIMEN STATUS REPORT

## 2018-01-21 DIAGNOSIS — Z3480 Encounter for supervision of other normal pregnancy, unspecified trimester: Secondary | ICD-10-CM

## 2018-01-22 LAB — SMN1 COPY NUMBER ANALYSIS (SMA CARRIER SCREENING)

## 2018-01-22 LAB — OBSTETRIC PANEL, INCLUDING HIV
BASOS ABS: 0 10*3/uL (ref 0.0–0.2)
Basos: 0 %
EOS (ABSOLUTE): 0.2 10*3/uL (ref 0.0–0.4)
Eos: 2 %
HEMATOCRIT: 34.9 % (ref 34.0–46.6)
HEMOGLOBIN: 11.8 g/dL (ref 11.1–15.9)
HEP B S AG: NEGATIVE
HIV SCREEN 4TH GENERATION: NONREACTIVE
IMMATURE GRANS (ABS): 0 10*3/uL (ref 0.0–0.1)
Immature Granulocytes: 0 %
Lymphocytes Absolute: 3.4 10*3/uL — ABNORMAL HIGH (ref 0.7–3.1)
Lymphs: 31 %
MCH: 30.3 pg (ref 26.6–33.0)
MCHC: 33.8 g/dL (ref 31.5–35.7)
MCV: 90 fL (ref 79–97)
Monocytes Absolute: 0.8 10*3/uL (ref 0.1–0.9)
Monocytes: 8 %
NEUTROS ABS: 6.5 10*3/uL (ref 1.4–7.0)
Neutrophils: 59 %
Platelets: 227 10*3/uL (ref 150–450)
RBC: 3.9 x10E6/uL (ref 3.77–5.28)
RDW: 12.7 % (ref 12.3–15.4)
RPR Ser Ql: NONREACTIVE
Rh Factor: POSITIVE
WBC: 11 10*3/uL — ABNORMAL HIGH (ref 3.4–10.8)

## 2018-01-22 LAB — AB SCR+ANTIBODY ID: ANTIBODY SCREEN: POSITIVE — AB

## 2018-01-25 ENCOUNTER — Encounter (HOSPITAL_COMMUNITY): Payer: Self-pay | Admitting: Emergency Medicine

## 2018-01-25 ENCOUNTER — Emergency Department (HOSPITAL_COMMUNITY)
Admission: EM | Admit: 2018-01-25 | Discharge: 2018-01-25 | Disposition: A | Discharge status: Home / Self Care | Payer: Medicaid Other | Attending: Emergency Medicine | Admitting: Emergency Medicine

## 2018-01-25 ENCOUNTER — Other Ambulatory Visit: Payer: Self-pay

## 2018-01-25 ENCOUNTER — Emergency Department (HOSPITAL_COMMUNITY): Payer: Medicaid Other

## 2018-01-25 DIAGNOSIS — Z3A16 16 weeks gestation of pregnancy: Secondary | ICD-10-CM | POA: Diagnosis not present

## 2018-01-25 DIAGNOSIS — Y999 Unspecified external cause status: Secondary | ICD-10-CM | POA: Insufficient documentation

## 2018-01-25 DIAGNOSIS — F17210 Nicotine dependence, cigarettes, uncomplicated: Secondary | ICD-10-CM | POA: Diagnosis not present

## 2018-01-25 DIAGNOSIS — O99332 Smoking (tobacco) complicating pregnancy, second trimester: Secondary | ICD-10-CM | POA: Diagnosis not present

## 2018-01-25 DIAGNOSIS — Z79899 Other long term (current) drug therapy: Secondary | ICD-10-CM | POA: Diagnosis not present

## 2018-01-25 DIAGNOSIS — Y92018 Other place in single-family (private) house as the place of occurrence of the external cause: Secondary | ICD-10-CM | POA: Diagnosis not present

## 2018-01-25 DIAGNOSIS — Z23 Encounter for immunization: Secondary | ICD-10-CM | POA: Insufficient documentation

## 2018-01-25 DIAGNOSIS — O9989 Other specified diseases and conditions complicating pregnancy, childbirth and the puerperium: Secondary | ICD-10-CM | POA: Diagnosis not present

## 2018-01-25 DIAGNOSIS — W25XXXA Contact with sharp glass, initial encounter: Secondary | ICD-10-CM | POA: Insufficient documentation

## 2018-01-25 DIAGNOSIS — Y9389 Activity, other specified: Secondary | ICD-10-CM | POA: Insufficient documentation

## 2018-01-25 DIAGNOSIS — S81812A Laceration without foreign body, left lower leg, initial encounter: Secondary | ICD-10-CM | POA: Diagnosis not present

## 2018-01-25 MED ORDER — ACETAMINOPHEN 500 MG PO TABS
1000.0000 mg | ORAL_TABLET | Freq: Once | ORAL | Status: AC
  Administered 2018-01-25: 1000 mg via ORAL
  Filled 2018-01-25: qty 2

## 2018-01-25 MED ORDER — CEPHALEXIN 500 MG PO CAPS: 500.0000 mg | ORAL_CAPSULE | Freq: Three times a day (TID) | ORAL | 0 refills | Status: AC

## 2018-01-25 MED ORDER — TETANUS-DIPHTH-ACELL PERTUSSIS 5-2.5-18.5 LF-MCG/0.5 IM SUSP
0.5000 mL | Freq: Once | INTRAMUSCULAR | Status: AC
  Administered 2018-01-25: 0.5 mL via INTRAMUSCULAR
  Filled 2018-01-25: qty 0.5

## 2018-01-25 MED ORDER — LIDOCAINE HCL (PF) 1 % IJ SOLN
20.0000 mL | Freq: Once | INTRAMUSCULAR | Status: AC
  Administered 2018-01-25: 20 mL via INTRADERMAL
  Filled 2018-01-25: qty 20

## 2018-01-25 NOTE — ED Notes (Signed)
ED Provider at bedside. 
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
## 2018-01-25 NOTE — Discharge Instructions (Signed)
Keep the wound clean and dry for the first 24 hours. After that you may gently clean the wound with soap and water. Make sure to pat dry the wound before covering it with any dressing. You can use topical antibiotic ointment and bandage. Ice and elevate for pain relief.   Take antibiotics as directed. Please take all of your antibiotics until finished.  You can take 1000 mg of Tylenol.  Do not exceed 4000 mg of Tylenol a day.  Apply ice to help with swelling.   Keep the leg elevated to help with swelling.   Follow-up with Dr. Stann Mainland office in 1 week.  He will take your staples out.  If he does not take them out, Return to the Emergency Department, your primary care doctor, or the [facility] in 7 days for suture removal.   Monitor closely for any signs of infection. Return to the Emergency Department for any worsening redness/swelling of the area that begins to spread, drainage from the site, worsening pain, fever or any other worsening or concerning symptoms.
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
## 2018-02-07 ENCOUNTER — Encounter (HOSPITAL_COMMUNITY): Payer: Self-pay | Admitting: Emergency Medicine

## 2018-02-07 ENCOUNTER — Ambulatory Visit (HOSPITAL_COMMUNITY)
Admission: EM | Admit: 2018-02-07 | Discharge: 2018-02-07 | Disposition: A | Discharge status: Home / Self Care | Payer: Medicaid Other | Attending: Family Medicine | Admitting: Family Medicine

## 2018-02-07 DIAGNOSIS — M79605 Pain in left leg: Secondary | ICD-10-CM

## 2018-02-07 DIAGNOSIS — S81812A Laceration without foreign body, left lower leg, initial encounter: Secondary | ICD-10-CM | POA: Diagnosis not present

## 2018-02-07 DIAGNOSIS — M79662 Pain in left lower leg: Secondary | ICD-10-CM

## 2018-02-07 DIAGNOSIS — Z4802 Encounter for removal of sutures: Secondary | ICD-10-CM

## 2018-02-07 NOTE — ED Triage Notes (Addendum)
Pt here for staple and suture removal to left leg; pt sts some swelling and pain still in foot and ankle; 20 staples removed and 3 sutures removed; mild bleeding from staple site; pt is [redacted] weeks pregnant
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
## 2018-02-07 NOTE — Discharge Instructions (Signed)
Call tomorrow morning to get your appointment with the orthopedist.  You need to be seen within 1 week It is important that you start working on range of motion of your ankle to stretch your calf.  Do this several times a day. Your wound is healing.  No evidence of infection.  Continue to watch for any redness, swelling, pain, or streaking Return for problems.

## 2018-02-08 ENCOUNTER — Encounter (HOSPITAL_COMMUNITY): Payer: Self-pay

## 2018-02-13 ENCOUNTER — Encounter: Payer: Self-pay | Admitting: Obstetrics & Gynecology

## 2018-02-13 ENCOUNTER — Ambulatory Visit (INDEPENDENT_AMBULATORY_CARE_PROVIDER_SITE_OTHER): Payer: Medicaid Other | Admitting: Obstetrics & Gynecology

## 2018-02-13 ENCOUNTER — Other Ambulatory Visit (HOSPITAL_COMMUNITY)
Admission: RE | Admit: 2018-02-13 | Discharge: 2018-02-13 | Disposition: A | Discharge status: Home / Self Care | Payer: Medicaid Other | Source: Ambulatory Visit | Attending: Obstetrics & Gynecology | Admitting: Obstetrics & Gynecology

## 2018-02-13 VITALS — BP 123/57 | HR 106 | Wt 138.0 lb

## 2018-02-13 DIAGNOSIS — O34219 Maternal care for unspecified type scar from previous cesarean delivery: Secondary | ICD-10-CM

## 2018-02-13 DIAGNOSIS — N898 Other specified noninflammatory disorders of vagina: Secondary | ICD-10-CM | POA: Diagnosis present

## 2018-02-13 DIAGNOSIS — Z23 Encounter for immunization: Secondary | ICD-10-CM

## 2018-02-13 DIAGNOSIS — Z3480 Encounter for supervision of other normal pregnancy, unspecified trimester: Secondary | ICD-10-CM

## 2018-02-13 DIAGNOSIS — R760 Raised antibody titer: Secondary | ICD-10-CM

## 2018-02-13 DIAGNOSIS — O0930 Supervision of pregnancy with insufficient antenatal care, unspecified trimester: Secondary | ICD-10-CM

## 2018-02-13 NOTE — Progress Notes (Signed)
cPatient reported white vaginal discharge. Patient recently completed course of antibiotics.
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
## 2018-02-14 LAB — DRUG SCREEN, URINE
AMPHETAMINES, URINE: NEGATIVE ng/mL
BARBITURATE SCREEN URINE: NEGATIVE ng/mL
Benzodiazepine Quant, Ur: NEGATIVE ng/mL
CANNABINOID QUANT UR: POSITIVE ng/mL — AB
COCAINE (METAB.): NEGATIVE ng/mL
OPIATE QUANT UR: NEGATIVE ng/mL
PCP Quant, Ur: NEGATIVE ng/mL

## 2018-02-15 ENCOUNTER — Other Ambulatory Visit: Payer: Self-pay | Admitting: Family Medicine

## 2018-02-15 ENCOUNTER — Ambulatory Visit (HOSPITAL_COMMUNITY)
Admission: RE | Admit: 2018-02-15 | Discharge: 2018-02-15 | Disposition: A | Discharge status: Home / Self Care | Payer: Medicaid Other | Source: Ambulatory Visit | Attending: Family Medicine | Admitting: Family Medicine

## 2018-02-15 DIAGNOSIS — Z3480 Encounter for supervision of other normal pregnancy, unspecified trimester: Secondary | ICD-10-CM

## 2018-02-15 DIAGNOSIS — O99332 Smoking (tobacco) complicating pregnancy, second trimester: Secondary | ICD-10-CM | POA: Diagnosis not present

## 2018-02-15 DIAGNOSIS — Z363 Encounter for antenatal screening for malformations: Secondary | ICD-10-CM | POA: Diagnosis not present

## 2018-02-15 DIAGNOSIS — O34219 Maternal care for unspecified type scar from previous cesarean delivery: Secondary | ICD-10-CM | POA: Insufficient documentation

## 2018-02-15 DIAGNOSIS — Z3A19 19 weeks gestation of pregnancy: Secondary | ICD-10-CM | POA: Diagnosis not present

## 2018-02-15 LAB — AFP, SERUM, OPEN SPINA BIFIDA
AFP MoM: 1.17
AFP Value: 52.1 ng/mL
Gest. Age on Collection Date: 18 weeks
Maternal Age At EDD: 26.3 yr
OSBR Risk 1 IN: 7137
TEST RESULTS AFP: NEGATIVE
WEIGHT: 138 [lb_av]

## 2018-02-15 LAB — ANTIBODY SCREEN

## 2018-02-15 LAB — CERVICOVAGINAL ANCILLARY ONLY
BACTERIAL VAGINITIS: POSITIVE — AB
CANDIDA VAGINITIS: POSITIVE — AB
CHLAMYDIA, DNA PROBE: NEGATIVE
NEISSERIA GONORRHEA: NEGATIVE
Trichomonas: POSITIVE — AB

## 2018-02-15 LAB — AB SCR+ANTIBODY ID
Antibody Screen: POSITIVE — AB
Coombs Titer #1: 2

## 2018-02-18 ENCOUNTER — Other Ambulatory Visit: Payer: Self-pay | Admitting: Obstetrics & Gynecology

## 2018-02-18 ENCOUNTER — Other Ambulatory Visit (HOSPITAL_COMMUNITY): Payer: Self-pay | Admitting: *Deleted

## 2018-02-18 DIAGNOSIS — B9689 Other specified bacterial agents as the cause of diseases classified elsewhere: Secondary | ICD-10-CM

## 2018-02-18 DIAGNOSIS — N760 Acute vaginitis: Secondary | ICD-10-CM

## 2018-02-18 DIAGNOSIS — O09899 Supervision of other high risk pregnancies, unspecified trimester: Secondary | ICD-10-CM

## 2018-02-18 DIAGNOSIS — B373 Candidiasis of vulva and vagina: Secondary | ICD-10-CM

## 2018-02-18 DIAGNOSIS — B3731 Acute candidiasis of vulva and vagina: Secondary | ICD-10-CM

## 2018-02-18 DIAGNOSIS — O99320 Drug use complicating pregnancy, unspecified trimester: Secondary | ICD-10-CM | POA: Insufficient documentation

## 2018-02-18 MED ORDER — METRONIDAZOLE 500 MG PO TABS: ORAL_TABLET | ORAL | 0 refills | Status: DC

## 2018-02-18 MED ORDER — CLOTRIMAZOLE 1 % VA CREA: 1.0000 | TOPICAL_CREAM | Freq: Every day | VAGINAL | 2 refills | Status: DC
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
## 2018-02-20 ENCOUNTER — Telehealth: Payer: Self-pay

## 2018-02-20 NOTE — Telephone Encounter (Signed)
Pt called the office requesting a copy of her TDAP vaccine for class. Per pt's permission, a copy of her TDAP vaccine was faxed to pt.

## 2018-03-13 ENCOUNTER — Encounter: Payer: Medicaid Other | Admitting: Obstetrics & Gynecology

## 2018-03-15 ENCOUNTER — Ambulatory Visit (HOSPITAL_COMMUNITY): Payer: Medicaid Other | Attending: Obstetrics & Gynecology

## 2018-03-15 ENCOUNTER — Encounter (HOSPITAL_COMMUNITY): Payer: Self-pay

## 2018-03-27 ENCOUNTER — Telehealth: Payer: Self-pay

## 2018-03-27 NOTE — Telephone Encounter (Signed)
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  Fax to [phone]  Kathrene Alu RN

## 2018-03-28 ENCOUNTER — Ambulatory Visit (HOSPITAL_COMMUNITY)
Admission: RE | Admit: 2018-03-28 | Discharge: 2018-03-28 | Disposition: A | Discharge status: Home / Self Care | Payer: Medicaid Other | Source: Ambulatory Visit | Attending: Obstetrics and Gynecology | Admitting: Obstetrics and Gynecology

## 2018-03-28 DIAGNOSIS — O99332 Smoking (tobacco) complicating pregnancy, second trimester: Secondary | ICD-10-CM | POA: Diagnosis not present

## 2018-03-28 DIAGNOSIS — Z362 Encounter for other antenatal screening follow-up: Secondary | ICD-10-CM | POA: Insufficient documentation

## 2018-03-28 DIAGNOSIS — O34219 Maternal care for unspecified type scar from previous cesarean delivery: Secondary | ICD-10-CM | POA: Diagnosis not present

## 2018-03-28 DIAGNOSIS — Z3A24 24 weeks gestation of pregnancy: Secondary | ICD-10-CM | POA: Diagnosis not present

## 2018-03-28 DIAGNOSIS — O09899 Supervision of other high risk pregnancies, unspecified trimester: Secondary | ICD-10-CM

## 2018-03-29 ENCOUNTER — Other Ambulatory Visit (HOSPITAL_COMMUNITY): Payer: Self-pay | Admitting: *Deleted

## 2018-03-29 DIAGNOSIS — O09899 Supervision of other high risk pregnancies, unspecified trimester: Secondary | ICD-10-CM

## 2018-04-03 ENCOUNTER — Ambulatory Visit (INDEPENDENT_AMBULATORY_CARE_PROVIDER_SITE_OTHER): Payer: Medicaid Other | Admitting: Family Medicine

## 2018-04-03 VITALS — BP 116/74 | HR 100 | Wt 143.0 lb

## 2018-04-03 DIAGNOSIS — O34219 Maternal care for unspecified type scar from previous cesarean delivery: Secondary | ICD-10-CM

## 2018-04-03 DIAGNOSIS — R760 Raised antibody titer: Secondary | ICD-10-CM

## 2018-04-03 DIAGNOSIS — Z3482 Encounter for supervision of other normal pregnancy, second trimester: Secondary | ICD-10-CM

## 2018-04-03 DIAGNOSIS — Z3480 Encounter for supervision of other normal pregnancy, unspecified trimester: Secondary | ICD-10-CM

## 2018-04-03 NOTE — Patient Instructions (Signed)

## 2018-04-04 NOTE — Progress Notes (Signed)
   PRENATAL VISIT NOTE  Subjective:  Courtney Strong is a 27 y.o. [id number] at [redacted]w[redacted]d being seen today for ongoing prenatal care.  She is currently monitored for the following issues for this low-risk pregnancy and has Pregnancy with history of cesarean section, antepartum; Need for varicella vaccine; Abnormal antibody titer; Supervision of other normal pregnancy, antepartum; and Drug use affecting pregnancy, antepartum on their problem list.  Patient reports no complaints.  Contractions: Not present. Vag. Bleeding: None.  Movement: Present. Denies leaking of fluid.   The following portions of the patient's history were reviewed and updated as appropriate: allergies, current medications, past family history, past medical history, past social history, past surgical history and problem list. Problem list updated.  Objective:   Vitals:   04/03/18 1532  BP: 116/74  Pulse: 100  Weight: 143 lb (64.9 kg)    Fetal Status: Fetal Heart Rate (bpm): 162 Fundal Height: 25 cm Movement: Present     General:  Alert, oriented and cooperative. Patient is in no acute distress.  Skin: Skin is warm and dry. No rash noted.   Cardiovascular: Normal heart rate noted  Respiratory: Normal respiratory effort, no problems with respiration noted  Abdomen: Soft, gravid, appropriate for gestational age.  Pain/Pressure: Present     Pelvic: Cervical exam deferred        Extremities: Normal range of motion.  Edema: None  Mental Status: Normal mood and affect. Normal behavior. Normal judgment and thought content.   Assessment and Plan:  Pregnancy: [id number] at [redacted]w[redacted]d  1. Supervision of other normal pregnancy, antepartum Continue routine prenatal care.   2. Pregnancy with history of cesarean section, antepartum For TOLAC--counseled and consent to be signed  3. Abnormal antibody titer Check with 28 wk labs.  Preterm labor symptoms and general obstetric precautions including but not limited to vaginal bleeding,  contractions, leaking of fluid and fetal movement were reviewed in detail with the patient. Please refer to After Visit Summary for other counseling recommendations.  Return in 3 weeks (on 04/24/2018) for 28 wk labs.  Future Appointments  Date Time Provider [place]  04/24/2018  2:45 PM Lavonia Drafts, MD [facility] None  04/25/2018  2:00 PM WH-MFC [place] 3 WH-MFCUS MFC-US    Donnamae Jude, MD

## 2018-04-23 ENCOUNTER — Ambulatory Visit (INDEPENDENT_AMBULATORY_CARE_PROVIDER_SITE_OTHER): Payer: Medicaid Other | Admitting: Advanced Practice Midwife

## 2018-04-23 ENCOUNTER — Encounter: Payer: Self-pay | Admitting: Advanced Practice Midwife

## 2018-04-23 VITALS — BP 120/45 | HR 96 | Wt 145.0 lb

## 2018-04-23 DIAGNOSIS — Q270 Congenital absence and hypoplasia of umbilical artery: Secondary | ICD-10-CM

## 2018-04-23 DIAGNOSIS — Z3480 Encounter for supervision of other normal pregnancy, unspecified trimester: Secondary | ICD-10-CM

## 2018-04-23 DIAGNOSIS — R760 Raised antibody titer: Secondary | ICD-10-CM

## 2018-04-23 DIAGNOSIS — Z01818 Encounter for other preprocedural examination: Secondary | ICD-10-CM | POA: Insufficient documentation

## 2018-04-23 NOTE — Progress Notes (Signed)
   PRENATAL VISIT NOTE  Subjective:  Courtney Strong is a 27 y.o. [id number] at [redacted]w[redacted]d being seen today for ongoing prenatal care.  She is currently monitored for the following issues for this low-risk pregnancy and has Pregnancy with history of cesarean section, antepartum; Abnormal antibody titer; Supervision of other normal pregnancy, antepartum; and Drug use affecting pregnancy, antepartum on their problem list.  Patient reports no complaints.  Contractions: Not present. Vag. Bleeding: None.  Movement: Present. Denies leaking of fluid.   The following portions of the patient's history were reviewed and updated as appropriate: allergies, current medications, past family history, past medical history, past social history, past surgical history and problem list. Problem list updated.  Objective:   Vitals:   04/23/18 1007  BP: (!) 120/45  Pulse: 96  Weight: 65.8 kg    Fetal Status: Fetal Heart Rate (bpm): 147 Fundal Height: 28 cm Movement: Present     General:  Alert, oriented and cooperative. Patient is in no acute distress.  Skin: Skin is warm and dry. No rash noted.   Cardiovascular: Normal heart rate noted  Respiratory: Normal respiratory effort, no problems with respiration noted  Abdomen: Soft, gravid, appropriate for gestational age.  Pain/Pressure: Present     Pelvic: Cervical exam deferred        Extremities: Normal range of motion.  Edema: None  Mental Status: Normal mood and affect. Normal behavior. Normal judgment and thought content.   Assessment and Plan:  Pregnancy: [id number] at [redacted]w[redacted]d  1. Abnormal antibody titer     Repeat titer with Glucose test tomorrow - Antibody screen - Antibody identification; Future  2. Supervision of other normal pregnancy, antepartum       - Glucose Tolerance, 2 Hours w/1 Hour - CBC - HIV antibody (with reflex) - RPR  3.   Single umbilical artery Has [place] tomorrow to check growth   Preterm labor symptoms and general obstetric  precautions including but not limited to vaginal bleeding, contractions, leaking of fluid and fetal movement were reviewed in detail with the patient. Please refer to After Visit Summary for other counseling recommendations.  Return in about 2 weeks (around 05/07/2018).  Future Appointments  Date Time Provider [place]  04/24/2018  8:30 AM [facility] NURSE [facility] None  04/24/2018 10:15 AM [facility]-MFC [place] 4 WH-MFCUS MFC-US  05/21/2018  8:15 AM Seabron Spates, CNM [facility] None    Hansel Feinstein, CNM

## 2018-04-23 NOTE — Patient Instructions (Signed)

## 2018-04-23 NOTE — Progress Notes (Signed)
Patient plans to return tomorrow morning for 28 week labs. Patient reports receiving flu shot at urgent care 04-01-18. Kathrene Alu RN

## 2018-04-24 ENCOUNTER — Ambulatory Visit (HOSPITAL_COMMUNITY)
Admission: RE | Admit: 2018-04-24 | Discharge: 2018-04-24 | Disposition: A | Discharge status: Home / Self Care | Payer: Medicaid Other | Source: Ambulatory Visit | Attending: Obstetrics and Gynecology | Admitting: Obstetrics and Gynecology

## 2018-04-24 ENCOUNTER — Other Ambulatory Visit: Payer: Medicaid Other

## 2018-04-24 ENCOUNTER — Encounter: Payer: Medicaid Other | Admitting: Obstetrics & Gynecology

## 2018-04-24 NOTE — Progress Notes (Signed)
Patient sent to lab for 28 week lab collection. Kathrene Alu RN

## 2018-04-25 ENCOUNTER — Ambulatory Visit (HOSPITAL_COMMUNITY): Payer: Medicaid Other

## 2018-04-25 LAB — AB SCR+ANTIBODY ID
ANTIBODY SCREEN: POSITIVE — AB
COOMBS TITER #1: 8

## 2018-04-25 LAB — CBC
HEMATOCRIT: 32.3 % — AB (ref 34.0–46.6)
Hemoglobin: 11.1 g/dL (ref 11.1–15.9)
MCH: 32.2 pg (ref 26.6–33.0)
MCHC: 34.4 g/dL (ref 31.5–35.7)
MCV: 94 fL (ref 79–97)
Platelets: 197 10*3/uL (ref 150–450)
RBC: 3.45 x10E6/uL — ABNORMAL LOW (ref 3.77–5.28)
RDW: 13.4 % (ref 12.3–15.4)
WBC: 9.2 10*3/uL (ref 3.4–10.8)

## 2018-04-25 LAB — ANTIBODY SCREEN

## 2018-04-25 LAB — GLUCOSE TOLERANCE, 2 HOURS W/ 1HR
Glucose, 1 hour: 160 mg/dL (ref 65–179)
Glucose, 2 hour: 66 mg/dL (ref 65–152)
Glucose, Fasting: 70 mg/dL (ref 65–91)

## 2018-04-25 LAB — HIV ANTIBODY (ROUTINE TESTING W REFLEX): HIV Screen 4th Generation wRfx: NONREACTIVE

## 2018-04-25 LAB — RPR: RPR Ser Ql: NONREACTIVE

## 2018-04-29 ENCOUNTER — Encounter (HOSPITAL_COMMUNITY): Payer: Self-pay

## 2018-04-29 ENCOUNTER — Ambulatory Visit (HOSPITAL_COMMUNITY)
Admission: RE | Admit: 2018-04-29 | Discharge: 2018-04-29 | Disposition: A | Discharge status: Home / Self Care | Payer: Medicaid Other | Source: Ambulatory Visit | Attending: Obstetrics & Gynecology | Admitting: Obstetrics & Gynecology

## 2018-04-29 ENCOUNTER — Other Ambulatory Visit (HOSPITAL_COMMUNITY): Payer: Self-pay | Admitting: Obstetrics and Gynecology

## 2018-04-29 DIAGNOSIS — O361930 Maternal care for other isoimmunization, third trimester, not applicable or unspecified: Secondary | ICD-10-CM

## 2018-04-29 DIAGNOSIS — O99323 Drug use complicating pregnancy, third trimester: Secondary | ICD-10-CM | POA: Insufficient documentation

## 2018-04-29 DIAGNOSIS — O2693 Pregnancy related conditions, unspecified, third trimester: Secondary | ICD-10-CM | POA: Diagnosis not present

## 2018-04-29 DIAGNOSIS — Z3A29 29 weeks gestation of pregnancy: Secondary | ICD-10-CM | POA: Insufficient documentation

## 2018-04-29 DIAGNOSIS — Z362 Encounter for other antenatal screening follow-up: Secondary | ICD-10-CM

## 2018-04-29 DIAGNOSIS — O99333 Smoking (tobacco) complicating pregnancy, third trimester: Secondary | ICD-10-CM | POA: Diagnosis not present

## 2018-04-29 DIAGNOSIS — O09899 Supervision of other high risk pregnancies, unspecified trimester: Secondary | ICD-10-CM | POA: Diagnosis present

## 2018-04-29 DIAGNOSIS — O34219 Maternal care for unspecified type scar from previous cesarean delivery: Secondary | ICD-10-CM | POA: Diagnosis not present

## 2018-04-30 ENCOUNTER — Other Ambulatory Visit (HOSPITAL_COMMUNITY): Payer: Self-pay | Admitting: *Deleted

## 2018-04-30 DIAGNOSIS — O2693 Pregnancy related conditions, unspecified, third trimester: Secondary | ICD-10-CM

## 2018-05-14 ENCOUNTER — Encounter (HOSPITAL_COMMUNITY): Payer: Self-pay

## 2018-05-14 ENCOUNTER — Ambulatory Visit (HOSPITAL_COMMUNITY)
Admission: RE | Admit: 2018-05-14 | Discharge: 2018-05-14 | Disposition: A | Discharge status: Home / Self Care | Payer: Medicaid Other | Source: Ambulatory Visit | Attending: Obstetrics & Gynecology | Admitting: Obstetrics & Gynecology

## 2018-05-14 ENCOUNTER — Telehealth: Payer: Self-pay

## 2018-05-14 DIAGNOSIS — Z3A31 31 weeks gestation of pregnancy: Secondary | ICD-10-CM | POA: Diagnosis present

## 2018-05-14 DIAGNOSIS — O99323 Drug use complicating pregnancy, third trimester: Secondary | ICD-10-CM | POA: Diagnosis not present

## 2018-05-14 DIAGNOSIS — O99333 Smoking (tobacco) complicating pregnancy, third trimester: Secondary | ICD-10-CM | POA: Diagnosis not present

## 2018-05-14 DIAGNOSIS — O09893 Supervision of other high risk pregnancies, third trimester: Secondary | ICD-10-CM | POA: Diagnosis not present

## 2018-05-14 DIAGNOSIS — O2693 Pregnancy related conditions, unspecified, third trimester: Secondary | ICD-10-CM | POA: Insufficient documentation

## 2018-05-14 DIAGNOSIS — O34219 Maternal care for unspecified type scar from previous cesarean delivery: Secondary | ICD-10-CM | POA: Diagnosis not present

## 2018-05-14 NOTE — Telephone Encounter (Signed)
Patient called stating that she has the diagnosis of drug use in second and third trimester of pregnancy. Patient is upset stating that she has a Chief Executive Officer in place because her kids are getting ready to be taken away. Patient states that MFM ultrasound told her to contact us about taking the diagnosis off because she only has had one drug test in July 2019 that was positive.   Patient states she wants speak to Dr. Kennon Rounds. Explained to patient that she could send Dr. Kennon Rounds a myChart message and I would forward it to her. Kathrene Alu RN

## 2018-05-21 ENCOUNTER — Encounter: Payer: Self-pay | Admitting: Advanced Practice Midwife

## 2018-05-21 ENCOUNTER — Ambulatory Visit (INDEPENDENT_AMBULATORY_CARE_PROVIDER_SITE_OTHER): Payer: Medicaid Other | Admitting: Advanced Practice Midwife

## 2018-05-21 VITALS — BP 104/63 | HR 97 | Wt 149.0 lb

## 2018-05-21 DIAGNOSIS — Z3480 Encounter for supervision of other normal pregnancy, unspecified trimester: Secondary | ICD-10-CM

## 2018-05-21 DIAGNOSIS — Q270 Congenital absence and hypoplasia of umbilical artery: Secondary | ICD-10-CM

## 2018-05-21 NOTE — Progress Notes (Signed)
   PRENATAL VISIT NOTE  Subjective:  Courtney Strong is a 27 y.o. [id number] at [redacted]w[redacted]d being seen today for ongoing prenatal care.  She is currently monitored for the following issues for this high-risk pregnancy and has Pregnancy with history of cesarean section, antepartum; Abnormal antibody titer; Supervision of other normal pregnancy, antepartum; Drug use affecting pregnancy, antepartum; Single umbilical artery; and Tubal ligation evaluation on their problem list.  Patient reports no complaints.  Contractions: Not present. Vag. Bleeding: None.  Movement: Present. Denies leaking of fluid.   The following portions of the patient's history were reviewed and updated as appropriate: allergies, current medications, past family history, past medical history, past social history, past surgical history and problem list. Problem list updated.  Objective:   Vitals:   05/21/18 0814  BP: 104/63  Pulse: 97  Weight: 67.6 kg    Fetal Status: Fetal Heart Rate (bpm): 140   Movement: Present     General:  Alert, oriented and cooperative. Patient is in no acute distress.  Skin: Skin is warm and dry. No rash noted.   Cardiovascular: Normal heart rate noted  Respiratory: Normal respiratory effort, no problems with respiration noted  Abdomen: Soft, gravid, appropriate for gestational age.  Pain/Pressure: Present     Pelvic: Cervical exam deferred        Extremities: Normal range of motion.  Edema: None  Mental Status: Normal mood and affect. Normal behavior. Normal judgment and thought content.   Assessment and Plan:  Pregnancy: [id number] at [redacted]w[redacted]d  1. Supervision of other normal pregnancy, antepartum     Last growth [place] normal. Dopplers have been normal     TOLAC consent signed.  Has had a successful VBAC   2. Single umbilical artery      Dopplers normal  Preterm labor symptoms and general obstetric precautions including but not limited to vaginal bleeding, contractions, leaking of fluid and  fetal movement were reviewed in detail with the patient. Please refer to After Visit Summary for other counseling recommendations.  Return in about 2 weeks (around 06/04/2018) for [employer].  Future Appointments  Date Time Provider [place]  05/28/2018 10:00 AM [facility]-MFC [place] 3 WH-MFCUS MFC-US  06/04/2018 10:30 AM Seabron Spates, CNM [facility] None    Hansel Feinstein, CNM

## 2018-05-21 NOTE — Progress Notes (Deleted)
   PRENATAL VISIT NOTE  Subjective:  Courtney Strong is a 27 y.o. [id number] at [redacted]w[redacted]d being seen today for ongoing prenatal care.  She is currently monitored for the following issues for this {Blank single:19197::"high-risk","low-risk"} pregnancy and has Pregnancy with history of cesarean section, antepartum; Abnormal antibody titer; Supervision of other normal pregnancy, antepartum; Drug use affecting pregnancy, antepartum; Single umbilical artery; and Tubal ligation evaluation on their problem list.  Patient reports {sx:[id number]}.  Contractions: Not present. Vag. Bleeding: None.  Movement: Present. Denies leaking of fluid.   The following portions of the patient's history were reviewed and updated as appropriate: allergies, current medications, past family history, past medical history, past social history, past surgical history and problem list. Problem list updated.  Objective:   Vitals:   05/21/18 0814  BP: 104/63  Pulse: 97  Weight: 67.6 kg    Fetal Status: Fetal Heart Rate (bpm): 140   Movement: Present     General:  Alert, oriented and cooperative. Patient is in no acute distress.  Skin: Skin is warm and dry. No rash noted.   Cardiovascular: Normal heart rate noted  Respiratory: Normal respiratory effort, no problems with respiration noted  Abdomen: Soft, gravid, appropriate for gestational age.  Pain/Pressure: Present     Pelvic: {Blank single:19197::"Cervical exam performed","Cervical exam deferred"}        Extremities: Normal range of motion.  Edema: None  Mental Status: Normal mood and affect. Normal behavior. Normal judgment and thought content.   Assessment and Plan:  Pregnancy: [id number] at [redacted]w[redacted]d  1. Supervision of other normal pregnancy, antepartum ***  2. Single umbilical artery ***  {Blank single:19197::"Term","Preterm"} labor symptoms and general obstetric precautions including but not limited to vaginal bleeding, contractions, leaking of fluid and fetal  movement were reviewed in detail with the patient. Please refer to After Visit Summary for other counseling recommendations.  Return in about 2 weeks (around 06/04/2018) for [employer].  Future Appointments  Date Time Provider [place]  05/28/2018 10:00 AM WH-MFC [place] 3 WH-MFCUS MFC-US  06/04/2018 10:30 AM Seabron Spates, CNM [facility] None    Hansel Feinstein, CNM

## 2018-05-21 NOTE — Patient Instructions (Signed)

## 2018-05-28 ENCOUNTER — Other Ambulatory Visit (HOSPITAL_COMMUNITY): Payer: Self-pay | Admitting: *Deleted

## 2018-05-28 ENCOUNTER — Encounter (HOSPITAL_COMMUNITY): Payer: Self-pay

## 2018-05-28 ENCOUNTER — Ambulatory Visit (HOSPITAL_COMMUNITY)
Admission: RE | Admit: 2018-05-28 | Discharge: 2018-05-28 | Disposition: A | Discharge status: Home / Self Care | Payer: Medicaid Other | Source: Ambulatory Visit | Attending: Obstetrics & Gynecology | Admitting: Obstetrics & Gynecology

## 2018-05-28 DIAGNOSIS — O34219 Maternal care for unspecified type scar from previous cesarean delivery: Secondary | ICD-10-CM | POA: Diagnosis not present

## 2018-05-28 DIAGNOSIS — O99323 Drug use complicating pregnancy, third trimester: Secondary | ICD-10-CM

## 2018-05-28 DIAGNOSIS — Z3A33 33 weeks gestation of pregnancy: Secondary | ICD-10-CM | POA: Insufficient documentation

## 2018-05-28 DIAGNOSIS — O99333 Smoking (tobacco) complicating pregnancy, third trimester: Secondary | ICD-10-CM

## 2018-05-28 DIAGNOSIS — O09899 Supervision of other high risk pregnancies, unspecified trimester: Secondary | ICD-10-CM

## 2018-05-28 DIAGNOSIS — O2693 Pregnancy related conditions, unspecified, third trimester: Secondary | ICD-10-CM | POA: Diagnosis present

## 2018-06-04 ENCOUNTER — Encounter: Payer: Medicaid Other | Admitting: Advanced Practice Midwife

## 2018-06-11 ENCOUNTER — Ambulatory Visit (HOSPITAL_COMMUNITY)
Admission: RE | Admit: 2018-06-11 | Discharge: 2018-06-11 | Disposition: A | Discharge status: Home / Self Care | Payer: Medicaid Other | Source: Ambulatory Visit | Attending: Advanced Practice Midwife | Admitting: Advanced Practice Midwife

## 2018-06-11 ENCOUNTER — Encounter: Payer: Medicaid Other | Admitting: Advanced Practice Midwife

## 2018-06-11 ENCOUNTER — Encounter (HOSPITAL_COMMUNITY): Payer: Self-pay

## 2018-06-17 ENCOUNTER — Ambulatory Visit (INDEPENDENT_AMBULATORY_CARE_PROVIDER_SITE_OTHER): Payer: Medicaid Other | Admitting: Obstetrics & Gynecology

## 2018-06-17 ENCOUNTER — Other Ambulatory Visit (HOSPITAL_COMMUNITY)
Admission: RE | Admit: 2018-06-17 | Discharge: 2018-06-17 | Disposition: A | Discharge status: Home / Self Care | Payer: Medicaid Other | Source: Ambulatory Visit | Attending: Advanced Practice Midwife | Admitting: Advanced Practice Midwife

## 2018-06-17 VITALS — BP 117/69 | HR 112 | Wt 152.1 lb

## 2018-06-17 DIAGNOSIS — Z01818 Encounter for other preprocedural examination: Secondary | ICD-10-CM

## 2018-06-17 DIAGNOSIS — Z3480 Encounter for supervision of other normal pregnancy, unspecified trimester: Secondary | ICD-10-CM | POA: Diagnosis not present

## 2018-06-17 DIAGNOSIS — R760 Raised antibody titer: Secondary | ICD-10-CM

## 2018-06-17 DIAGNOSIS — O34219 Maternal care for unspecified type scar from previous cesarean delivery: Secondary | ICD-10-CM

## 2018-06-17 DIAGNOSIS — Z3483 Encounter for supervision of other normal pregnancy, third trimester: Secondary | ICD-10-CM

## 2018-06-17 DIAGNOSIS — O99323 Drug use complicating pregnancy, third trimester: Secondary | ICD-10-CM

## 2018-06-17 DIAGNOSIS — Q270 Congenital absence and hypoplasia of umbilical artery: Secondary | ICD-10-CM

## 2018-06-17 DIAGNOSIS — O99320 Drug use complicating pregnancy, unspecified trimester: Secondary | ICD-10-CM

## 2018-06-17 DIAGNOSIS — Z3A36 36 weeks gestation of pregnancy: Secondary | ICD-10-CM

## 2018-06-17 LAB — OB RESULTS CONSOLE GC/CHLAMYDIA: GC PROBE AMP, GENITAL: NEGATIVE

## 2018-06-17 LAB — OB RESULTS CONSOLE GBS: STREP GROUP B AG: NEGATIVE

## 2018-06-17 NOTE — Progress Notes (Signed)
   PRENATAL VISIT NOTE  Subjective:  Courtney Strong is a 27 y.o. [id number] at [redacted]w[redacted]d being seen today for ongoing prenatal care.  She is currently monitored for the following issues for this high-risk pregnancy and has Pregnancy with history of cesarean section, antepartum; Abnormal antibody titer; Supervision of other normal pregnancy, antepartum; Drug use affecting pregnancy, antepartum; Single umbilical artery; and Tubal ligation evaluation on their problem list.  Patient reports no complaints.  Contractions: Irritability. Vag. Bleeding: None.  Movement: Present. Denies leaking of fluid.   The following portions of the patient's history were reviewed and updated as appropriate: allergies, current medications, past family history, past medical history, past social history, past surgical history and problem list. Problem list updated.  Objective:   Vitals:   06/17/18 0956  BP: 117/69  Pulse: (!) 112  Weight: 152 lb 1.3 oz (69 kg)    Fetal Status: Fetal Heart Rate (bpm): 136   Movement: Present     General:  Alert, oriented and cooperative. Patient is in no acute distress.  Skin: Skin is warm and dry. No rash noted.   Cardiovascular: Normal heart rate noted  Respiratory: Normal respiratory effort, no problems with respiration noted  Abdomen: Soft, gravid, appropriate for gestational age.  Pain/Pressure: Present     Pelvic: Cervical exam performed        Extremities: Normal range of motion.  Edema: Trace  Mental Status: Normal mood and affect. Normal behavior. Normal judgment and thought content.   Assessment and Plan:  Pregnancy: L3J0300 at [redacted]w[redacted]d  1. Supervision of other normal pregnancy, antepartum GBS and cx done today 2. Pregnancy with history of cesarean section, antepartum Desires TOLAC   3. Drug use affecting pregnancy, antepartum H/o +THC  4. Abnormal antibody titer Antibody titer  > 1:2  7/19 >> 1:8 10/19  5. Single umbilical artery 92/09/3005 Est. FW:    2253   gm    4 lb 15 oz      61  % Impression  Normal interval growth. ---------------------------------------------------------------------- Recommendations  Follow up MCA in 2 weeks.  6. Tubal ligation evaluation Title XIX papers signed 04/23/2018   Preterm labor symptoms and general obstetric precautions including but not limited to vaginal bleeding, contractions, leaking of fluid and fetal movement were reviewed in detail with the patient. Please refer to After Visit Summary for other counseling recommendations.  Return in about 1 week (around 06/24/2018).  Future Appointments  Date Time Provider [place]  06/25/2018  3:00 PM [facility]-MFC [place] 3 WH-MFCUS MFC-US    Lavonia Drafts, MD

## 2018-06-18 LAB — GC/CHLAMYDIA PROBE AMP (~~LOC~~) NOT AT ARMC
Chlamydia: NEGATIVE
Neisseria Gonorrhea: NEGATIVE

## 2018-06-22 LAB — CULTURE, BETA STREP (GROUP B ONLY): STREP GP B CULTURE: NEGATIVE

## 2018-06-25 ENCOUNTER — Encounter (HOSPITAL_COMMUNITY): Payer: Self-pay

## 2018-06-25 ENCOUNTER — Ambulatory Visit (HOSPITAL_COMMUNITY)
Admission: RE | Admit: 2018-06-25 | Discharge: 2018-06-25 | Disposition: A | Discharge status: Home / Self Care | Payer: Medicaid Other | Source: Ambulatory Visit | Attending: Maternal & Fetal Medicine | Admitting: Maternal & Fetal Medicine

## 2018-06-25 ENCOUNTER — Other Ambulatory Visit (HOSPITAL_COMMUNITY): Payer: Self-pay | Admitting: Maternal & Fetal Medicine

## 2018-06-25 DIAGNOSIS — O34219 Maternal care for unspecified type scar from previous cesarean delivery: Secondary | ICD-10-CM | POA: Insufficient documentation

## 2018-06-25 DIAGNOSIS — O99333 Smoking (tobacco) complicating pregnancy, third trimester: Secondary | ICD-10-CM | POA: Insufficient documentation

## 2018-06-25 DIAGNOSIS — Z3480 Encounter for supervision of other normal pregnancy, unspecified trimester: Secondary | ICD-10-CM

## 2018-06-25 DIAGNOSIS — O99320 Drug use complicating pregnancy, unspecified trimester: Secondary | ICD-10-CM

## 2018-06-25 DIAGNOSIS — O09899 Supervision of other high risk pregnancies, unspecified trimester: Secondary | ICD-10-CM

## 2018-06-25 DIAGNOSIS — Z3A37 37 weeks gestation of pregnancy: Secondary | ICD-10-CM | POA: Diagnosis not present

## 2018-06-25 DIAGNOSIS — Q270 Congenital absence and hypoplasia of umbilical artery: Secondary | ICD-10-CM

## 2018-06-27 ENCOUNTER — Ambulatory Visit (INDEPENDENT_AMBULATORY_CARE_PROVIDER_SITE_OTHER): Payer: Medicaid Other | Admitting: Family Medicine

## 2018-06-27 VITALS — BP 115/70 | HR 102 | Wt 156.0 lb

## 2018-06-27 DIAGNOSIS — Z3480 Encounter for supervision of other normal pregnancy, unspecified trimester: Secondary | ICD-10-CM

## 2018-06-27 DIAGNOSIS — Q270 Congenital absence and hypoplasia of umbilical artery: Secondary | ICD-10-CM

## 2018-06-27 DIAGNOSIS — O99320 Drug use complicating pregnancy, unspecified trimester: Secondary | ICD-10-CM

## 2018-06-27 DIAGNOSIS — Z3483 Encounter for supervision of other normal pregnancy, third trimester: Secondary | ICD-10-CM

## 2018-06-27 DIAGNOSIS — Z3A37 37 weeks gestation of pregnancy: Secondary | ICD-10-CM

## 2018-06-27 DIAGNOSIS — R760 Raised antibody titer: Secondary | ICD-10-CM

## 2018-06-27 DIAGNOSIS — O99323 Drug use complicating pregnancy, third trimester: Secondary | ICD-10-CM

## 2018-06-27 DIAGNOSIS — O34219 Maternal care for unspecified type scar from previous cesarean delivery: Secondary | ICD-10-CM

## 2018-06-27 NOTE — Progress Notes (Signed)
   PRENATAL VISIT NOTE  Subjective:  Courtney Strong is a 27 y.o. [id number] at [redacted]w[redacted]d being seen today for ongoing prenatal care.  She is currently monitored for the following issues for this high-risk pregnancy and has Pregnancy with history of cesarean section, antepartum; Abnormal antibody titer; Supervision of other normal pregnancy, antepartum; Drug use affecting pregnancy, antepartum; Single umbilical artery; and Tubal ligation evaluation on their problem list.  Patient reports no complaints.  Contractions: Irregular. Vag. Bleeding: None.  Movement: Present. Denies leaking of fluid.   The following portions of the patient's history were reviewed and updated as appropriate: allergies, current medications, past family history, past medical history, past social history, past surgical history and problem list. Problem list updated.  Objective:   Vitals:   06/27/18 1405  BP: 115/70  Pulse: (!) 102  Weight: 156 lb (70.8 kg)    Fetal Status: Fetal Heart Rate (bpm): 149 Fundal Height: 37 cm Movement: Present  Presentation: Vertex  General:  Alert, oriented and cooperative. Patient is in no acute distress.  Skin: Skin is warm and dry. No rash noted.   Cardiovascular: Normal heart rate noted  Respiratory: Normal respiratory effort, no problems with respiration noted  Abdomen: Soft, gravid, appropriate for gestational age.  Pain/Pressure: Present     Pelvic: Cervical exam deferred        Extremities: Normal range of motion.  Edema: Trace  Mental Status: Normal mood and affect. Normal behavior. Normal judgment and thought content.   Assessment and Plan:  Pregnancy: [id number] at [redacted]w[redacted]d  1. Supervision of other normal pregnancy, antepartum FHT and FH normal. Would like induction after 39 weeks. Will schedule  2. Pregnancy with history of cesarean section, antepartum TOLAC discussed  3. Single umbilical artery [place] 20/2: EFW 8# (90%) AC >97%  4. Abnormal antibody titer MCA doppler  normal  5. Drug use affecting pregnancy, antepartum   Term labor symptoms and general obstetric precautions including but not limited to vaginal bleeding, contractions, leaking of fluid and fetal movement were reviewed in detail with the patient. Please refer to After Visit Summary for other counseling recommendations.  No follow-ups on file.  Future Appointments  Date Time Provider [place]  07/04/2018  3:15 PM Truett Mainland, DO [facility] None    Truett Mainland, DO

## 2018-06-27 NOTE — Progress Notes (Signed)
Patient is scheduled for induction on 07-08-18 at 7:30am. Kathrene Alu RN

## 2018-06-28 ENCOUNTER — Telehealth (HOSPITAL_COMMUNITY): Payer: Self-pay | Admitting: *Deleted

## 2018-06-28 NOTE — Telephone Encounter (Signed)
Preadmission screen  

## 2018-07-02 ENCOUNTER — Other Ambulatory Visit: Payer: Self-pay | Admitting: Family Medicine

## 2018-07-04 ENCOUNTER — Ambulatory Visit (INDEPENDENT_AMBULATORY_CARE_PROVIDER_SITE_OTHER): Payer: Medicaid Other | Admitting: Family Medicine

## 2018-07-04 VITALS — BP 122/62 | HR 100 | Wt 156.0 lb

## 2018-07-04 DIAGNOSIS — O34219 Maternal care for unspecified type scar from previous cesarean delivery: Secondary | ICD-10-CM

## 2018-07-04 DIAGNOSIS — R760 Raised antibody titer: Secondary | ICD-10-CM

## 2018-07-04 DIAGNOSIS — Q270 Congenital absence and hypoplasia of umbilical artery: Secondary | ICD-10-CM

## 2018-07-04 DIAGNOSIS — Z3480 Encounter for supervision of other normal pregnancy, unspecified trimester: Secondary | ICD-10-CM

## 2018-07-04 DIAGNOSIS — Z3483 Encounter for supervision of other normal pregnancy, third trimester: Secondary | ICD-10-CM

## 2018-07-04 NOTE — Progress Notes (Signed)
   PRENATAL VISIT NOTE  Subjective:  Courtney Strong is a 27 y.o. [id number] at [redacted]w[redacted]d being seen today for ongoing prenatal care.  She is currently monitored for the following issues for this high-risk pregnancy and has Pregnancy with history of cesarean section, antepartum; Abnormal antibody titer; Supervision of other normal pregnancy, antepartum; Drug use affecting pregnancy, antepartum; Single umbilical artery; and Tubal ligation evaluation on their problem list.  Patient reports no complaints.  Contractions: Irregular. Vag. Bleeding: None.  Movement: Present. Denies leaking of fluid.   The following portions of the patient's history were reviewed and updated as appropriate: allergies, current medications, past family history, past medical history, past social history, past surgical history and problem list. Problem list updated.  Objective:   Vitals:   07/04/18 1509  BP: 122/62  Pulse: 100  Weight: 156 lb (70.8 kg)    Fetal Status: Fetal Heart Rate (bpm): 135 Fundal Height: 38 cm Movement: Present  Presentation: Vertex  General:  Alert, oriented and cooperative. Patient is in no acute distress.  Skin: Skin is warm and dry. No rash noted.   Cardiovascular: Normal heart rate noted  Respiratory: Normal respiratory effort, no problems with respiration noted  Abdomen: Soft, gravid, appropriate for gestational age.  Pain/Pressure: Present     Pelvic: Cervical exam performed Dilation: 3 Effacement (%): 50 Station: -3  Extremities: Normal range of motion.  Edema: Trace  Mental Status: Normal mood and affect. Normal behavior. Normal judgment and thought content.   Assessment and Plan:  Pregnancy: [id number] at [redacted]w[redacted]d  1. Supervision of other normal pregnancy, antepartum FHT and FH normal  2. Pregnancy with history of cesarean section, antepartum TOLAC  3. Single umbilical artery Normal growth  4. Abnormal antibody titer   Term labor symptoms and general obstetric precautions  including but not limited to vaginal bleeding, contractions, leaking of fluid and fetal movement were reviewed in detail with the patient. Please refer to After Visit Summary for other counseling recommendations.  Return in about 6 weeks (around 08/15/2018) for Postpartum Exam.  Future Appointments  Date Time Provider [place]  07/08/2018  6:30 AM [facility] None    Truett Mainland, DO

## 2018-07-08 ENCOUNTER — Inpatient Hospital Stay (HOSPITAL_COMMUNITY)
Admission: RE | Admit: 2018-07-08 | Discharge: 2018-07-10 | DRG: 806 | Disposition: A | Discharge status: Home / Self Care | Payer: Medicaid Other | Attending: Obstetrics and Gynecology | Admitting: Obstetrics and Gynecology

## 2018-07-08 ENCOUNTER — Other Ambulatory Visit: Payer: Self-pay

## 2018-07-08 ENCOUNTER — Inpatient Hospital Stay (HOSPITAL_COMMUNITY): Payer: Medicaid Other | Admitting: Anesthesiology

## 2018-07-08 ENCOUNTER — Encounter (HOSPITAL_COMMUNITY): Payer: Self-pay

## 2018-07-08 VITALS — BP 110/73 | HR 75 | Temp 98.2°F | Resp 18 | Ht 64.0 in | Wt 157.4 lb

## 2018-07-08 DIAGNOSIS — O34219 Maternal care for unspecified type scar from previous cesarean delivery: Secondary | ICD-10-CM | POA: Diagnosis present

## 2018-07-08 DIAGNOSIS — O99334 Smoking (tobacco) complicating childbirth: Secondary | ICD-10-CM | POA: Diagnosis present

## 2018-07-08 DIAGNOSIS — F17210 Nicotine dependence, cigarettes, uncomplicated: Secondary | ICD-10-CM | POA: Diagnosis present

## 2018-07-08 DIAGNOSIS — Z3490 Encounter for supervision of normal pregnancy, unspecified, unspecified trimester: Secondary | ICD-10-CM | POA: Diagnosis present

## 2018-07-08 DIAGNOSIS — Z3480 Encounter for supervision of other normal pregnancy, unspecified trimester: Secondary | ICD-10-CM

## 2018-07-08 DIAGNOSIS — Z3A39 39 weeks gestation of pregnancy: Secondary | ICD-10-CM

## 2018-07-08 DIAGNOSIS — Q270 Congenital absence and hypoplasia of umbilical artery: Secondary | ICD-10-CM

## 2018-07-08 DIAGNOSIS — O99320 Drug use complicating pregnancy, unspecified trimester: Secondary | ICD-10-CM

## 2018-07-08 LAB — CBC
HEMATOCRIT: 34.4 % — AB (ref 36.0–46.0)
Hemoglobin: 11.3 g/dL — ABNORMAL LOW (ref 12.0–15.0)
MCH: 30.1 pg (ref 26.0–34.0)
MCHC: 32.8 g/dL (ref 30.0–36.0)
MCV: 91.5 fL (ref 80.0–100.0)
Platelets: 230 10*3/uL (ref 150–400)
RBC: 3.76 MIL/uL — ABNORMAL LOW (ref 3.87–5.11)
RDW: 14.4 % (ref 11.5–15.5)
WBC: 11.9 10*3/uL — ABNORMAL HIGH (ref 4.0–10.5)
nRBC: 0 % (ref 0.0–0.2)

## 2018-07-08 LAB — RPR: RPR Ser Ql: NONREACTIVE

## 2018-07-08 MED ORDER — LIDOCAINE HCL (PF) 1 % IJ SOLN
30.0000 mL | INTRAMUSCULAR | Status: DC | PRN
  Filled 2018-07-08: qty 30

## 2018-07-08 MED ORDER — EPHEDRINE 5 MG/ML INJ
10.0000 mg | INTRAVENOUS | Status: DC | PRN
  Filled 2018-07-08: qty 2

## 2018-07-08 MED ORDER — LACTATED RINGERS IV SOLN: 500.0000 mL | INTRAVENOUS | Status: DC | PRN

## 2018-07-08 MED ORDER — FENTANYL CITRATE (PF) 100 MCG/2ML IJ SOLN
INTRAMUSCULAR | Status: AC
  Administered 2018-07-08: 100 ug via INTRAVENOUS
  Filled 2018-07-08: qty 2

## 2018-07-08 MED ORDER — FENTANYL 2.5 MCG/ML BUPIVACAINE 1/10 % EPIDURAL INFUSION (WH - ANES)
INTRAMUSCULAR | Status: AC
  Filled 2018-07-08: qty 100

## 2018-07-08 MED ORDER — TERBUTALINE SULFATE 1 MG/ML IJ SOLN
0.2500 mg | Freq: Once | INTRAMUSCULAR | Status: DC | PRN
  Filled 2018-07-08: qty 1

## 2018-07-08 MED ORDER — PHENYLEPHRINE 40 MCG/ML (10ML) SYRINGE FOR IV PUSH (FOR BLOOD PRESSURE SUPPORT)
80.0000 ug | PREFILLED_SYRINGE | INTRAVENOUS | Status: DC | PRN
  Filled 2018-07-08: qty 10

## 2018-07-08 MED ORDER — PHENYLEPHRINE 40 MCG/ML (10ML) SYRINGE FOR IV PUSH (FOR BLOOD PRESSURE SUPPORT): 80.0000 ug | PREFILLED_SYRINGE | INTRAVENOUS | Status: DC | PRN

## 2018-07-08 MED ORDER — OXYCODONE-ACETAMINOPHEN 5-325 MG PO TABS: 2.0000 | ORAL_TABLET | ORAL | Status: DC | PRN

## 2018-07-08 MED ORDER — DIPHENHYDRAMINE HCL 50 MG/ML IJ SOLN: 12.5000 mg | INTRAMUSCULAR | Status: DC | PRN

## 2018-07-08 MED ORDER — FENTANYL 2.5 MCG/ML BUPIVACAINE 1/10 % EPIDURAL INFUSION (WH - ANES)
14.0000 mL/h | INTRAMUSCULAR | Status: DC | PRN
  Administered 2018-07-08: 14 mL/h via EPIDURAL

## 2018-07-08 MED ORDER — EPHEDRINE 5 MG/ML INJ: 10.0000 mg | INTRAVENOUS | Status: DC | PRN

## 2018-07-08 MED ORDER — FENTANYL CITRATE (PF) 100 MCG/2ML IJ SOLN
100.0000 ug | INTRAMUSCULAR | Status: DC | PRN
  Administered 2018-07-08: 100 ug via INTRAVENOUS

## 2018-07-08 MED ORDER — LIDOCAINE HCL (PF) 1 % IJ SOLN
INTRAMUSCULAR | Status: DC | PRN
  Administered 2018-07-08: 5 mL via EPIDURAL

## 2018-07-08 MED ORDER — SOD CITRATE-CITRIC ACID 500-334 MG/5ML PO SOLN: 30.0000 mL | ORAL | Status: DC | PRN

## 2018-07-08 MED ORDER — LACTATED RINGERS IV SOLN
500.0000 mL | Freq: Once | INTRAVENOUS | Status: AC
  Administered 2018-07-08: 500 mL via INTRAVENOUS

## 2018-07-08 MED ORDER — ONDANSETRON HCL 4 MG/2ML IJ SOLN
4.0000 mg | Freq: Four times a day (QID) | INTRAMUSCULAR | Status: DC | PRN
  Administered 2018-07-08: 4 mg via INTRAVENOUS
  Filled 2018-07-08: qty 2

## 2018-07-08 MED ORDER — OXYTOCIN 40 UNITS IN LACTATED RINGERS INFUSION - SIMPLE MED: 2.5000 [IU]/h | INTRAVENOUS | Status: DC

## 2018-07-08 MED ORDER — LACTATED RINGERS IV SOLN
INTRAVENOUS | Status: DC
  Administered 2018-07-08 (×2): via INTRAVENOUS

## 2018-07-08 MED ORDER — OXYCODONE-ACETAMINOPHEN 5-325 MG PO TABS: 1.0000 | ORAL_TABLET | ORAL | Status: DC | PRN

## 2018-07-08 MED ORDER — ACETAMINOPHEN 325 MG PO TABS: 650.0000 mg | ORAL_TABLET | ORAL | Status: DC | PRN

## 2018-07-08 MED ORDER — LACTATED RINGERS IV SOLN: 500.0000 mL | Freq: Once | INTRAVENOUS | Status: DC

## 2018-07-08 MED ORDER — OXYTOCIN 40 UNITS IN LACTATED RINGERS INFUSION - SIMPLE MED
1.0000 m[IU]/min | INTRAVENOUS | Status: DC
  Administered 2018-07-08: 2 m[IU]/min via INTRAVENOUS
  Filled 2018-07-08: qty 1000

## 2018-07-08 MED ORDER — PHENYLEPHRINE 40 MCG/ML (10ML) SYRINGE FOR IV PUSH (FOR BLOOD PRESSURE SUPPORT)
PREFILLED_SYRINGE | INTRAVENOUS | Status: AC
  Filled 2018-07-08: qty 10

## 2018-07-08 MED ORDER — OXYTOCIN BOLUS FROM INFUSION
500.0000 mL | Freq: Once | INTRAVENOUS | Status: AC
  Administered 2018-07-08: 500 mL via INTRAVENOUS

## 2018-07-08 NOTE — Progress Notes (Signed)
LABOR PROGRESS NOTE  Courtney Strong is a 27 y.o. [phone] at [redacted]w[redacted]d  admitted for IOL 2/2 2 vessel cord  Subjective: Patient is feeling more increased pain with contractions  Objective: BP 105/68   Pulse 92   Temp 98.1 F (36.7 C) (Oral)   Resp 15   Ht 5\' 4"  (1.626 m)   Wt 71.4 kg   LMP 10/05/2017 (Exact Date)   BMI 27.02 kg/m  or  Vitals:   07/08/18 1603 07/08/18 1640 07/08/18 1736 07/08/18 1812  BP: 120/66 125/72 (!) 109/52 105/68  Pulse: 69 74 86 92  Resp: 17 18  15   Temp:   98.1 F (36.7 C)   TempSrc:   Oral   Weight:      Height:        ~1915 Dilation: 5 Effacement (%): 60 Cervical Position: Posterior Station: -2 Presentation: Vertex Exam by:: Ouida Sills, MD FHT: baseline rate 120, moderate varibility, positive acel, negative decel Toco: 2-4 minutes moderate  Labs: Lab Results  Component Value Date   WBC 11.9 (H) 07/08/2018   HGB 11.3 (L) 07/08/2018   HCT 34.4 (L) 07/08/2018   MCV 91.5 07/08/2018   PLT 230 07/08/2018    Patient Active Problem List   Diagnosis Date Noted  . Encounter for induction of labor 07/08/2018  . Single umbilical artery 53/61/4431  . Tubal ligation evaluation 04/23/2018  . Drug use affecting pregnancy, antepartum 02/18/2018  . Supervision of other normal pregnancy, antepartum 01/16/2018  . Abnormal antibody titer 11/21/2012  . Pregnancy with history of cesarean section, antepartum 11/20/2012    Assessment / Plan: 27 y.o. [id number] at [redacted]w[redacted]d here for IOL 2/2 2-vessel cord  Labor: progressing on pitocin 16. Cervix ~5cm, 60%, -2 station. AROM at this time with scant clear fluid. Fetal Wellbeing:  Category I Pain Control:  None at this time Anticipated MOD:  vaginal  Doristine Mango DO 07/08/2018, 7:23 PM

## 2018-07-08 NOTE — Anesthesia Procedure Notes (Signed)
Epidural Patient location during procedure: OB Start time: 07/08/2018 8:33 PM End time: 07/08/2018 8:39 PM  Staffing Anesthesiologist: Effie Berkshire, MD Performed: anesthesiologist   Preanesthetic Checklist Completed: patient identified, site marked, surgical consent, pre-op evaluation, timeout performed, IV checked, risks and benefits discussed and monitors and equipment checked  Epidural Patient position: sitting Prep: ChloraPrep Patient monitoring: heart rate, continuous pulse ox and blood pressure Approach: midline Location: L3-L4 Injection technique: LOR saline  Needle:  Needle type: Tuohy  Needle gauge: 17 G Needle length: 9 cm Catheter type: closed end flexible Catheter size: 20 Guage Test dose: negative and 1.5% lidocaine  Assessment Events: blood not aspirated, injection not painful, no injection resistance and no paresthesia  Additional Notes LOR @ 5  Patient identified. Risks/Benefits/Options discussed with patient including but not limited to bleeding, infection, nerve damage, paralysis, failed block, incomplete pain control, headache, blood pressure changes, nausea, vomiting, reactions to medications, itching and postpartum back pain. Confirmed with bedside nurse the patient's most recent platelet count. Confirmed with patient that they are not currently taking any anticoagulation, have any bleeding history or any family history of bleeding disorders. Patient expressed understanding and wished to proceed. All questions were answered. Sterile technique was used throughout the entire procedure. Please see nursing notes for vital signs. Test dose was given through epidural catheter and negative prior to continuing to dose epidural or start infusion. Warning signs of high block given to the patient including shortness of breath, tingling/numbness in hands, complete motor block, or any concerning symptoms with instructions to call for help. Patient was given instructions  on fall risk and not to get out of bed. All questions and concerns addressed with instructions to call with any issues or inadequate analgesia.    Reason for block:procedure for pain

## 2018-07-08 NOTE — Progress Notes (Signed)
LABOR PROGRESS NOTE  Courtney Strong is a 27 y.o. [id number] at [redacted]w[redacted]d  admitted for IOL 2/2 2 vessel umbilical artery  Subjective: Patient has been walking to try to progress labor. She is feeling occasional contractions which seem to be increasing in discomfort.   Objective: BP 120/66   Pulse 69   Temp 98 F (36.7 C) (Oral)   Resp 17   Ht 5\' 4"  (1.626 m)   Wt 71.4 kg   LMP 10/05/2017 (Exact Date)   BMI 27.02 kg/m  or  Vitals:   07/08/18 1339 07/08/18 1430 07/08/18 1505 07/08/18 1603  BP: 124/77 122/64 120/73 120/66  Pulse: 90 83 71 69  Resp: 15 18 16 17   Temp: 98 F (36.7 C)     TempSrc: Oral     Weight:      Height:        ~1630 Dilation: 4.5 Effacement (%): 50 Cervical Position: Posterior Station: -3 Presentation: Vertex Exam by:: Dr. Ouida Sills FHT: baseline rate 125, moderate varibility, positive acel, negative decel Toco: 2-4 minutes, moderate  Labs: Lab Results  Component Value Date   WBC 11.9 (H) 07/08/2018   HGB 11.3 (L) 07/08/2018   HCT 34.4 (L) 07/08/2018   MCV 91.5 07/08/2018   PLT 230 07/08/2018    Patient Active Problem List   Diagnosis Date Noted  . Encounter for induction of labor 07/08/2018  . Single umbilical artery 72/03/4708  . Tubal ligation evaluation 04/23/2018  . Drug use affecting pregnancy, antepartum 02/18/2018  . Supervision of other normal pregnancy, antepartum 01/16/2018  . Abnormal antibody titer 11/21/2012  . Pregnancy with history of cesarean section, antepartum 11/20/2012    Assessment / Plan: 27 y.o. [id number] at [redacted]w[redacted]d here for IOL 2/2 2 vessel cord. She has been walking and bouncing on labor ball excessively.   Labor: cervix is progressing with pitocin and walking. Dilation increased to ~4.5cm. baby's head is still not strongly engaged with cervix at this time. Will continue to monitor and consider AROM at next check if head is better engaged. Pitocin was increased to 12 at this time. Fetal Wellbeing:  Category  I Pain Control:  None at this time. Considering epidural in future Anticipated MOD:  vaginal    DO 07/08/2018, 4:57 PM

## 2018-07-08 NOTE — Progress Notes (Signed)
LABOR PROGRESS NOTE  EDLIN FORD is a 27 y.o. H0T8882 at [redacted]w[redacted]d  admitted for IOL 2/2 2 vessel umbilical artery  Subjective: Patient is feeling irregular contractions. She is walking/squatting/and bouncing on the laboring ball. She does not want a foley bulb. She does not want any other pain meds at this time.  Objective: BP (!) 109/56   Pulse 87   Temp 98.3 F (36.8 C) (Oral)   Resp 16   Ht 5\' 4"  (1.626 m)   Wt 71.4 kg   LMP 10/05/2017 (Exact Date)   BMI 27.02 kg/m  or  Vitals:   07/08/18 0732 07/08/18 0929 07/08/18 1005 07/08/18 1053  BP: 127/65 116/70 124/78 (!) 109/56  Pulse: 97 82 81 87  Resp: 18 18 16 16   Temp: 98.3 F (36.8 C)     TempSrc: Oral     Weight: 71.4 kg     Height: 5\' 4"  (1.626 m)       ~1130 Dilation: 3.5 Effacement (%): 50 Cervical Position: Posterior Station: -3 Presentation: Vertex Exam by:: Dr. Ouida Sills FHT: baseline rate 130, moderate varibility, positive acel, none decel Toco: irregular 1-6 minutes  Labs: Lab Results  Component Value Date   WBC 11.9 (H) 07/08/2018   HGB 11.3 (L) 07/08/2018   HCT 34.4 (L) 07/08/2018   MCV 91.5 07/08/2018   PLT 230 07/08/2018    Patient Active Problem List   Diagnosis Date Noted  . Encounter for induction of labor 07/08/2018  . Single umbilical artery 80/09/4915  . Tubal ligation evaluation 04/23/2018  . Drug use affecting pregnancy, antepartum 02/18/2018  . Supervision of other normal pregnancy, antepartum 01/16/2018  . Abnormal antibody titer 11/21/2012  . Pregnancy with history of cesarean section, antepartum 11/20/2012    Assessment / Plan: 27 y.o. H1T0569 at [redacted]w[redacted]d here for IOL 2/2 2 vessel umbilical artery TOLAC.   Labor: TOLAC, expectant management at this time. Cervix ix making progression to ~3.5cm/50%/-3. No FB indicated at this time. Pitocin is running at 6 currently Fetal Wellbeing:  Category I Pain Control:  None at this time. Considering epidural  Anticipated MOD:   vaginal  Katoria Yetman DO 07/08/2018, 11:36 AM

## 2018-07-08 NOTE — Progress Notes (Signed)
LABOR PROGRESS NOTE  Courtney Strong is a 27 y.o. [phone] at [redacted]w[redacted]d  admitted for IOL 2/2 2 vessel cord  Subjective: Patient comfortable with epidural.   Objective: BP 114/67 (BP Location: Left Arm)   Pulse 66   Temp 98.3 F (36.8 C) (Oral)   Resp 16   Ht 5\' 4"  (1.626 m)   Wt 71.4 kg   LMP 10/05/2017 (Exact Date)   SpO2 98%   BMI 27.02 kg/m  or  Vitals:   07/08/18 2055 07/08/18 2100 07/08/18 2105 07/08/18 2110  BP: 126/80 119/65 (!) 121/52 114/67  Pulse: 69 68 76 66  Resp: 18 16 17 16   Temp:      TempSrc:      SpO2: 99% 98% 97% 98%  Weight:      Height:        Dilation: 7 Effacement (%): 70 Cervical Position: Posterior Station: -1 Presentation: Vertex Exam by:: Jobe Gibbon, DO FHT: baseline rate 120, moderate varibility, positive acel, negative decel Toco: 2-3 minutes moderate  Labs: Lab Results  Component Value Date   WBC 11.9 (H) 07/08/2018   HGB 11.3 (L) 07/08/2018   HCT 34.4 (L) 07/08/2018   MCV 91.5 07/08/2018   PLT 230 07/08/2018    Patient Active Problem List   Diagnosis Date Noted  . Encounter for induction of labor 07/08/2018  . Single umbilical artery 07/62/2633  . Tubal ligation evaluation 04/23/2018  . Drug use affecting pregnancy, antepartum 02/18/2018  . Supervision of other normal pregnancy, antepartum 01/16/2018  . Abnormal antibody titer 11/21/2012  . Pregnancy with history of cesarean section, antepartum 11/20/2012    Assessment / Plan: 27 y.o. H5K5625 at [redacted]w[redacted]d here for IOL 2/2 2-vessel cord  Labor: progressing on pitocin 16. Patient with BBOW, AROM at 2130 with clear fluid and moderate amount of fluid.  Fetal Wellbeing:  Cat I  Pain Control:  Epidural in place  Anticipated MOD:  NSVD   Phill Myron, D.O. OB Family Medicine Fellow, [facility] for [facility], [facility] 07/08/2018, 9:35 PM

## 2018-07-08 NOTE — H&P (Addendum)
LABOR AND DELIVERY ADMISSION HISTORY AND PHYSICAL NOTE  Courtney Strong is a 27 y.o. female [phone] with IUP at [redacted]w[redacted]d by LMP presenting for IOL with TOLAC for single umbilical artery.  She reports positive fetal movement. She denies leakage of fluid or vaginal bleeding.  Prenatal History/Complications: PNC at [facility] Pregnancy complications:  - h/o cesarean with previous pregnancy - single umbilical artery  - drug use affecting pregnancy - BTL desired (signed 04/23/2018) - abnormal antibody titer - tobacco use during pregnancy - EFW >90% at 37 weeks. Abdominal circumference >95%, amniotic fluid is normal  Past Medical History: Past Medical History:  Diagnosis Date  . Abnormal Pap smear 2007   ASC-US   . Bacterial vaginosis   . Chlamydia    x3 , treated   . Hx of trichomoniasis 2015  . Migraines   . UTI (lower urinary tract infection)     Past Surgical History: Past Surgical History:  Procedure Laterality Date  . CESAREAN SECTION      Obstetrical History: OB History    Gravida  5   Para  2   Term  2   Preterm      AB  2   Living  2     SAB  2   TAB  0   Ectopic      Multiple      Live Births  2           Social History: Social History   Socioeconomic History  . Marital status: Single    Spouse name: Not on file  . Number of children: Not on file  . Years of education: Not on file  . Highest education level: Not on file  Occupational History  . Not on file  Social Needs  . Financial resource strain: Not on file  . Food insecurity:    Worry: Patient refused    Inability: Patient refused  . Transportation needs:    Medical: Patient refused    Non-medical: Patient refused  Tobacco Use  . Smoking status: Current Every Day Smoker    Packs/day: 0.25    Years: 10.00    Pack years: 2.50    Types: Cigarettes  . Smokeless tobacco: Never Used  . Tobacco comment: trying to quit  Substance and Sexual Activity  . Alcohol use: Not  Currently    Comment: social drinker  . Drug use: Not Currently  . Sexual activity: Yes    Birth control/protection: None  Lifestyle  . Physical activity:    Days per week: Patient refused    Minutes per session: Patient refused  . Stress: Not on file  Relationships  . Social connections:    Talks on phone: Patient refused    Gets together: Patient refused    Attends religious service: Patient refused    Active member of club or organization: Patient refused    Attends meetings of clubs or organizations: Patient refused    Relationship status: Patient refused  Other Topics Concern  . Not on file  Social History Narrative  . Not on file    Family History: Family History  Problem Relation Age of Onset  . Other Neg Hx   . Hearing loss Neg Hx     Allergies: No Known Allergies  Medications Prior to Admission  Medication Sig Dispense Refill Last Dose  . Prenatal Vit-Fe Fumarate-FA (PRENATAL VITAMINS PLUS PO) Take by mouth.   Taking     Review of Systems  All systems  reviewed and negative except as stated in HPI  Physical Exam Blood pressure 127/65, pulse 97, temperature 98.3 F (36.8 C), temperature source Oral, resp. rate 18, height 5\' 4"  (1.626 m), weight 71.4 kg, last menstrual period 10/05/2017, unknown if currently breastfeeding. General appearance: alert, oriented, NAD Lungs: normal respiratory effort Heart: regular rate Abdomen: soft, non-tender; gravid, FH appropriate for GA Extremities: No calf swelling or tenderness Presentation: cephalic Fetal monitoring: category I Uterine activity: occasional  Dilation: 1.5 Effacement (%): 50 Station: -3 Exam by:: Delorise Shiner, RN   Prenatal labs: ABO, Rh: A/Positive/-- (06/26 1610) Antibody: Positive, See Final Results (10/02 0823) Rubella: <0.90 (06/26 1610) RPR: Non Reactive (10/02 0823)  HBsAg: Negative (06/26 1610)  HIV: Non Reactive (10/02 0823)  GC/Chlamydia: negative GBS:   negative 2-hr GTT:  negative Genetic screening:  AFP low risk Anatomy US: normal. EFW >90% at 37 weeks. Abdominal circumference >95%, amniotic fluid is normal  Prenatal Transfer Tool  Maternal Diabetes: No Genetic Screening: unknown Maternal Ultrasounds/Referrals: Abnormal:  Findings:   Other: 2 vessel umbilical cord Fetal Ultrasounds or other Referrals:  Referred to Materal Fetal Medicine anatomy normal, 2 vessel umbilical cord Maternal Substance Abuse:  Yes:  Type: Smoker Significant Maternal Medications:  None Significant Maternal Lab Results: Lab values include: Other: abnormal antibody titer. > 1:2  7/19 >> 1:8 10/19  Results for orders placed or performed during the hospital encounter of 07/08/18 (from the past 24 hour(s))  CBC   Collection Time: 07/08/18  7:34 AM  Result Value Ref Range   WBC 11.9 (H) 4.0 - 10.5 K/uL   RBC 3.76 (L) 3.87 - 5.11 MIL/uL   Hemoglobin 11.3 (L) 12.0 - 15.0 g/dL   HCT 34.4 (L) 36.0 - 46.0 %   MCV 91.5 80.0 - 100.0 fL   MCH 30.1 26.0 - 34.0 pg   MCHC 32.8 30.0 - 36.0 g/dL   RDW 14.4 11.5 - 15.5 %   Platelets 230 150 - 400 K/uL   nRBC 0.0 0.0 - 0.2 %    Patient Active Problem List   Diagnosis Date Noted  . Encounter for induction of labor 07/08/2018  . Single umbilical artery 53/00/5110  . Tubal ligation evaluation 04/23/2018  . Drug use affecting pregnancy, antepartum 02/18/2018  . Supervision of other normal pregnancy, antepartum 01/16/2018  . Abnormal antibody titer 11/21/2012  . Pregnancy with history of cesarean section, antepartum 11/20/2012    Assessment: Courtney Strong is a 27 y.o. [id number] at [redacted]w[redacted]d here for TOLAC IOL for single umbilical artery  #Labor: induction with pitocin and FB.  #Pain: Considering epidural #FWB: Category I #ID:  GBS negative #MOF: breast #MOC:BTL- consent signed #Circ:  yes  Doristine Mango DO 07/08/2018, 9:52 AM   OB FELLOW HISTORY AND PHYSICAL ATTESTATION  I have seen and examined this patient; I agree with  above documentation in the resident's note.   Phill Myron, D.O. OB Fellow  07/08/2018, 12:21 PM

## 2018-07-08 NOTE — Anesthesia Preprocedure Evaluation (Signed)
Anesthesia Evaluation  Patient identified by MRN, date of birth, ID band Patient awake    Reviewed: Allergy & Precautions, Patient's Chart, lab work & pertinent test results  Airway Mallampati: II       Dental no notable dental hx.    Pulmonary Current Smoker,    Pulmonary exam normal        Cardiovascular Normal cardiovascular exam     Neuro/Psych  Headaches,    GI/Hepatic   Endo/Other    Renal/GU      Musculoskeletal   Abdominal   Peds  Hematology   Anesthesia Other Findings   Reproductive/Obstetrics (+) Pregnancy                             Anesthesia Physical Anesthesia Plan  ASA: II  Anesthesia Plan: Epidural   Post-op Pain Management:    Induction:   PONV Risk Score and Plan:   Airway Management Planned: Natural Airway  Additional Equipment: None  Intra-op Plan:   Post-operative Plan:   Informed Consent: I have reviewed the patients History and Physical, chart, labs and discussed the procedure including the risks, benefits and alternatives for the proposed anesthesia with the patient or authorized representative who has indicated his/her understanding and acceptance.     Plan Discussed with:   Anesthesia Plan Comments: (Lab Results      Component                Value               Date                      WBC                      11.9 (H)            07/08/2018                HGB                      11.3 (L)            07/08/2018                HCT                      34.4 (L)            07/08/2018                MCV                      91.5                07/08/2018                PLT                      230                 07/08/2018           )        Anesthesia Quick Evaluation

## 2018-07-08 NOTE — Anesthesia Pain Management Evaluation Note (Signed)
  CRNA Pain Management Visit Note  Patient: Courtney Strong, 27 y.o., female  "Hello I am a member of the anesthesia team at [facility]. We have an anesthesia team available at all times to provide care throughout the hospital, including epidural management and anesthesia for C-section. I don't know your plan for the delivery whether it a natural birth, water birth, IV sedation, nitrous supplementation, doula or epidural, but we want to meet your pain goals."   1.Was your pain managed to your expectations on prior hospitalizations?   Yes   2.What is your expectation for pain management during this hospitalization?     Labor support without medications, Epidural and IV pain meds  3.How can we help you reach that goal? Support PRN  Record the patient's initial score and the patient's pain goal.   Pain: 3  Pain Goal: 8 [facility] wants you to be able to say your pain was always managed very well.  Kathie Rhodes 07/08/2018

## 2018-07-09 ENCOUNTER — Encounter (HOSPITAL_COMMUNITY): Payer: Self-pay

## 2018-07-09 DIAGNOSIS — Z3A39 39 weeks gestation of pregnancy: Secondary | ICD-10-CM

## 2018-07-09 MED ORDER — DIBUCAINE 1 % RE OINT: 1.0000 "application " | TOPICAL_OINTMENT | RECTAL | Status: DC | PRN

## 2018-07-09 MED ORDER — SENNOSIDES-DOCUSATE SODIUM 8.6-50 MG PO TABS
2.0000 | ORAL_TABLET | ORAL | Status: DC
  Administered 2018-07-09 – 2018-07-10 (×2): 2 via ORAL
  Filled 2018-07-09 (×2): qty 2

## 2018-07-09 MED ORDER — DIPHENHYDRAMINE HCL 25 MG PO CAPS: 25.0000 mg | ORAL_CAPSULE | Freq: Four times a day (QID) | ORAL | Status: DC | PRN

## 2018-07-09 MED ORDER — ONDANSETRON HCL 4 MG/2ML IJ SOLN: 4.0000 mg | INTRAMUSCULAR | Status: DC | PRN

## 2018-07-09 MED ORDER — WITCH HAZEL-GLYCERIN EX PADS: 1.0000 "application " | MEDICATED_PAD | CUTANEOUS | Status: DC | PRN

## 2018-07-09 MED ORDER — SIMETHICONE 80 MG PO CHEW: 80.0000 mg | CHEWABLE_TABLET | ORAL | Status: DC | PRN

## 2018-07-09 MED ORDER — HYDROXYZINE HCL 25 MG PO TABS
25.0000 mg | ORAL_TABLET | Freq: Three times a day (TID) | ORAL | Status: DC | PRN
  Filled 2018-07-09: qty 1

## 2018-07-09 MED ORDER — ONDANSETRON HCL 4 MG PO TABS: 4.0000 mg | ORAL_TABLET | ORAL | Status: DC | PRN

## 2018-07-09 MED ORDER — ACETAMINOPHEN 325 MG PO TABS: 650.0000 mg | ORAL_TABLET | ORAL | Status: DC | PRN

## 2018-07-09 MED ORDER — COCONUT OIL OIL
1.0000 "application " | TOPICAL_OIL | Status: DC | PRN
  Filled 2018-07-09: qty 120

## 2018-07-09 MED ORDER — BENZOCAINE-MENTHOL 20-0.5 % EX AERO
1.0000 "application " | INHALATION_SPRAY | CUTANEOUS | Status: DC | PRN
  Filled 2018-07-09: qty 56

## 2018-07-09 MED ORDER — PRENATAL MULTIVITAMIN CH
1.0000 | ORAL_TABLET | Freq: Every day | ORAL | Status: DC
  Administered 2018-07-09: 1 via ORAL
  Filled 2018-07-09: qty 1

## 2018-07-09 MED ORDER — IBUPROFEN 600 MG PO TABS
600.0000 mg | ORAL_TABLET | Freq: Four times a day (QID) | ORAL | Status: DC
  Administered 2018-07-09 – 2018-07-10 (×5): 600 mg via ORAL
  Filled 2018-07-09 (×5): qty 1

## 2018-07-09 MED ORDER — TETANUS-DIPHTH-ACELL PERTUSSIS 5-2.5-18.5 LF-MCG/0.5 IM SUSP: 0.5000 mL | Freq: Once | INTRAMUSCULAR | Status: DC

## 2018-07-09 MED ORDER — MEASLES, MUMPS & RUBELLA VAC IJ SOLR: 0.5000 mL | Freq: Once | INTRAMUSCULAR | Status: DC

## 2018-07-09 MED ORDER — ZOLPIDEM TARTRATE 5 MG PO TABS: 5.0000 mg | ORAL_TABLET | Freq: Every evening | ORAL | Status: DC | PRN

## 2018-07-09 NOTE — Progress Notes (Signed)
Post Partum Day 1 Subjective: no complaints, up ad lib, voiding and tolerating PO  Objective: Blood pressure 125/61, pulse (!) 57, temperature 98 F (36.7 C), temperature source Oral, resp. rate 17, height 5\' 4"  (1.626 m), weight 71.4 kg, last menstrual period 10/05/2017, SpO2 100 %, unknown if currently breastfeeding.  Physical Exam:  General: alert, cooperative and no distress Lochia: appropriate Uterine Fundus: firm Incision: n/a DVT Evaluation: No evidence of DVT seen on physical exam. Negative Homan's sign. No cords or calf tenderness.  Recent Labs    07/08/18 0734  HGB 11.3*  HCT 34.4*    Assessment/Plan: Plan for discharge tomorrow and Breastfeeding  SW consulted - no barriers to discharge   LOS: 1 day   Truett Mainland 07/09/2018, 8:30 PM

## 2018-07-09 NOTE — Progress Notes (Signed)
Pt decided that she does not want a tubal and wants to eat.  Left epidural in and told pt the MD will discuss it with her in the am and did give pt something to eat per her request.

## 2018-07-09 NOTE — Progress Notes (Signed)
CLINICAL SOCIAL WORK MATERNAL/CHILD NOTE  Patient Details  Name: Courtney Strong MRN: [id number] Date of Birth: 07/08/2017  Date:  07/09/2018  Clinical Social Worker Initiating Note:  Abundio Miu, [place]   Date/Time: Initiated:  07/09/18/1428             Child's Name:  Courtney Strong   Biological Parents:  Mother, Father(Father - Hurman Horn 06-23-87)   Need for Interpreter:  None   Reason for Referral:  Current Substance Use/Substance Use During Pregnancy (Hx of THC use during pregnancy)   Address:  [place] [address]    Phone number:  [phone] (home)     Additional phone number:   Household Members/Support Persons (HM/SP):       HM/SP Name Relationship DOB or Age  HM/SP -1     HM/SP -2     HM/SP -3     HM/SP -4     HM/SP -5     HM/SP -6     HM/SP -7     HM/SP -8       Natural Supports (not living in the home): Extended Family, Parent, Neighbors, Radiographer, therapeutic Supports:None   Employment:Other (comment)   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   Education:  Herbalist)   Homebound arranged:    Museum/gallery curator Resources:Medicaid   Other Resources: [employer], Physicist, medical    Cultural/Religious Considerations Which May Impact Care:   Strengths: Ability to meet basic needs , Home prepared for child , Pediatrician chosen   Psychotropic Medications:         Pediatrician:    Careers adviser area  Pediatrician List:   [facility] ([place])  [place]     Pediatrician Fax Number:    Risk Factors/Current Problems: Substance Use    Cognitive State: Alert , Able to Concentrate , Linear Thinking    Mood/Affect: Animated, Irritable , Interested    CSW Assessment:CSW spoke with MOB  at bedside, MOB had 2 visitors present. MOB granted CSW verbal permission to speak in front of MOB's visitors about anything. CSW introduced self and explained reason for consult. MOB was animated and at times sarcastic during assessment. MOB was engaged throughout assessment.   MOB reported that she resides alone and that she is employed by a [employer]. MOB reported that she plans to return to work when she can. MOB reported that FOB is involved. CSW inquired about MOB's support system if she needed anything for the baby, MOB reported the god parents, her parents, neighbors, the [employer] gives out diapers. MOB became animated when listing supports and seemed slightly sarcastic. MOB reported that she had everything that she needed for the baby.    MOB reported that she has 26 other children that reside with family members. MOB denied any CPS history/invovlement and reports that it was her decision for her children to stay with family members, MOB did not elaborate on why she made that decision. MOB reported that her daughter is 75 years old and was coming to the hospital to see the baby. MOB reported that her daughter resides with her cousin and her son resides with an aunt and uncle.   CSW informed MOB about hospital drug policy and inquired about MOB's substance use during pregnancy. MOB reported that she smoked one  blunt after court in June because she had a bad day. MOB reported that she is not an addict and she don't have a problem. CSW explained that a CPS report would be made if infant had a positive UDS or CDS for any illegal substances and or unprescribed substances, MOB replied "she is okay" referring to the infant.   CSW inquired about MOB's mental health history, MOB denied any mental health history. MOB was engaged during assessment and became animated at times. MOB appeared to be irritated by certain questions asked during CSW assessment, MOB appeared to be bonded with  infant and was holding infant throughout assessment. MOB did not demonstrate any acute mental health signs/symptoms. CSW assessed for safety, MOB denied SI, HI and domestic violence.   CSW provided education regarding the baby blues period vs. perinatal mood disorders, discussed treatment and gave resources for mental health follow up if concerns arise.  CSW recommends self-evaluation during the postpartum time period using the New Mom Checklist from Postpartum Progress and encouraged MOB to contact a medical professional if symptoms are noted at any time.    CSW provided review of Sudden Infant Death Syndrome (SIDS) precautions. MOB reported that infant would sleep either in her crib or basinet.   CSW contacted [place], Intake worker confirmed that MOB has no open cases and or concerns with infant discharging to MOB.   CSW identifies no further need for intervention and no barriers to discharge at this time.  CSW Plan/Description: No Further Intervention Required/No Barriers to Discharge, Perinatal Mood and Anxiety Disorder (PMADs) Education, Sudden Infant Death Syndrome (SIDS) Education, [place], CSW Will Continue to Monitor Umbilical Cord Tissue Drug Screen Results and Make Report if Barbette Or, LCSW 07/09/2018, 2:32 PM

## 2018-07-09 NOTE — Anesthesia Postprocedure Evaluation (Signed)
Anesthesia Post Note  Patient: Courtney Strong  Procedure(s) Performed: AN AD HOC LABOR EPIDURAL     Patient location during evaluation: Mother Baby Anesthesia Type: Epidural Level of consciousness: awake Pain management: pain level controlled Vital Signs Assessment: post-procedure vital signs reviewed and stable Respiratory status: spontaneous breathing Cardiovascular status: stable Postop Assessment: epidural receding and patient able to bend at knees Anesthetic complications: no    Last Vitals:  Vitals:   07/09/18 0216 07/09/18 0640  BP: 122/66 109/66  Pulse: 61 69  Resp:  16  Temp: 36.7 C 36.9 C  SpO2: 98%     Last Pain:  Vitals:   07/09/18 0640  TempSrc: Oral  PainSc: 0-No pain   Pain Goal:                 Everette Rank

## 2018-07-09 NOTE — Lactation Note (Signed)
This note was copied from a baby's chart. Lactation Consultation Note  Patient Name: Courtney Strong [id number] Date: 07/09/2018 Reason for consult: Initial assessment;Term  P3 mother whose infant is now 105 hours old.  This is mother's third child and she has breast feeding experience.  After I introduced myself mother made it clear that she does not want any more interruptions.  Her words to me, "Every time I turn around someone is coming in this frickin room."  I apologized for all the interruptions and offered my services.  She replied, "This is my third and I know what I am doing."  I asked her if she would like to be removed from the lactation list so no one will come in from our department.  She said, "Yes!"  I did inform her of how she can reach [place] if needed.  She has a DEBP for home use.  RN updated.    Maternal Data Formula Feeding for Exclusion: No Has patient been taught Hand Expression?: Yes Does the patient have breastfeeding experience prior to this delivery?: Yes  Feeding Feeding Type: Breast Fed  LATCH Score                   Interventions    Lactation Tools Discussed/Used     Consult Status Consult Status: Complete Date: 07/09/18 Follow-up type: Call as needed    Maanav Kassabian R Rae Plotner 07/09/2018, 12:16 PM

## 2018-07-10 MED ORDER — IBUPROFEN 600 MG PO TABS: 600.0000 mg | ORAL_TABLET | Freq: Four times a day (QID) | ORAL | 0 refills | Status: DC

## 2018-07-10 NOTE — Discharge Instructions (Signed)
Vaginal Delivery, Care After °Refer to this sheet in the next few weeks. These instructions provide you with information about caring for yourself after vaginal delivery. Your health care provider may also give you more specific instructions. Your treatment has been planned according to current medical practices, but problems sometimes occur. Call your health care provider if you have any problems or questions. °What can I expect after the procedure? °After vaginal delivery, it is common to have: °· Some bleeding from your vagina. °· Soreness in your abdomen, your vagina, and the area of skin between your vaginal opening and your anus (perineum). °· Pelvic cramps. °· Fatigue. °Follow these instructions at home: °Medicines °· Take over-the-counter and prescription medicines only as told by your health care provider. °· If you were prescribed an antibiotic medicine, take it as told by your health care provider. Do not stop taking the antibiotic until it is finished. °Driving ° °· Do not drive or operate heavy machinery while taking prescription pain medicine. °· Do not drive for 24 hours if you received a sedative. °Lifestyle °· Do not drink alcohol. This is especially important if you are breastfeeding or taking medicine to relieve pain. °· Do not use tobacco products, including cigarettes, chewing tobacco, or e-cigarettes. If you need help quitting, ask your health care provider. °Eating and drinking °· Drink at least 8 eight-ounce glasses of water every day unless you are told not to by your health care provider. If you choose to breastfeed your baby, you may need to drink more water than this. °· Eat high-fiber foods every day. These foods may help prevent or relieve constipation. High-fiber foods include: °? Whole grain cereals and breads. °? Brown rice. °? Beans. °? Fresh fruits and vegetables. °Activity °· Return to your normal activities as told by your health care provider. Ask your health care provider what  activities are safe for you. °· Rest as much as possible. Try to rest or take a nap when your baby is sleeping. °· Do not lift anything that is heavier than your baby or 10 lb (4.5 kg) until your health care provider says that it is safe. °· Talk with your health care provider about when you can engage in sexual activity. This may depend on your: °? Risk of infection. °? Rate of healing. °? Comfort and desire to engage in sexual activity. °Vaginal Care °· If you have an episiotomy or a vaginal tear, check the area every day for signs of infection. Check for: °? More redness, swelling, or pain. °? More fluid or blood. °? Warmth. °? Pus or a bad smell. °· Do not use tampons or douches until your health care provider says this is safe. °· Watch for any blood clots that may pass from your vagina. These may look like clumps of dark red, brown, or black discharge. °General instructions °· Keep your perineum clean and dry as told by your health care provider. °· Wear loose, comfortable clothing. °· Wipe from front to back when you use the toilet. °· Ask your health care provider if you can shower or take a bath. If you had an episiotomy or a perineal tear during labor and delivery, your health care provider may tell you not to take baths for a certain length of time. °· Wear a bra that supports your breasts and fits you well. °· If possible, have someone help you with household activities and help care for your baby for at least a few days after you   leave the hospital. °· Keep all follow-up visits for you and your baby as told by your health care provider. This is important. °Contact a health care provider if: °· You have: °? Vaginal discharge that has a bad smell. °? Difficulty urinating. °? Pain when urinating. °? A sudden increase or decrease in the frequency of your bowel movements. °? More redness, swelling, or pain around your episiotomy or vaginal tear. °? More fluid or blood coming from your episiotomy or vaginal  tear. °? Pus or a bad smell coming from your episiotomy or vaginal tear. °? A fever. °? A rash. °? Little or no interest in activities you used to enjoy. °? Questions about caring for yourself or your baby. °· Your episiotomy or vaginal tear feels warm to the touch. °· Your episiotomy or vaginal tear is separating or does not appear to be healing. °· Your breasts are painful, hard, or turn red. °· You feel unusually sad or worried. °· You feel nauseous or you vomit. °· You pass large blood clots from your vagina. If you pass a blood clot from your vagina, save it to show to your health care provider. Do not flush blood clots down the toilet without having your health care provider look at them. °· You urinate more than usual. °· You are dizzy or light-headed. °· You have not breastfed at all and you have not had a menstrual period for 12 weeks after delivery. °· You have stopped breastfeeding and you have not had a menstrual period for 12 weeks after you stopped breastfeeding. °Get help right away if: °· You have: °? Pain that does not go away or does not get better with medicine. °? Chest pain. °? Difficulty breathing. °? Blurred vision or spots in your vision. °? Thoughts about hurting yourself or your baby. °· You develop pain in your abdomen or in one of your legs. °· You develop a severe headache. °· You faint. °· You bleed from your vagina so much that you fill two sanitary pads in one hour. °This information is not intended to replace advice given to you by your health care provider. Make sure you discuss any questions you have with your health care provider. °Document Released: 07/07/2000 Document Revised: 12/22/2015 Document Reviewed: 07/25/2015 °Elsevier Interactive Patient Education © 2019 Elsevier Inc. ° °

## 2018-07-10 NOTE — Discharge Summary (Signed)
OB Discharge Summary     Patient Name: Courtney Strong DOB: January 22, 1991 MRN: [id number]  Date of admission: 07/08/2018 Delivering MD: Nicolette Bang   Date of discharge: 07/10/2018  Admitting diagnosis: 39WKS, INDUCTION Intrauterine pregnancy: [redacted]w[redacted]d     Secondary diagnosis:  Active Problems:   Encounter for induction of labor  Additional problems: s/p vaginal delivery     Discharge diagnosis: Term Pregnancy Delivered                                                                                                Post partum procedures:none  Augmentation: AROM and Pitocin  Complications: None  Hospital course:  Induction of Labor With Vaginal Delivery   27 y.o. yo [id number] at [redacted]w[redacted]d was admitted to the hospital 07/08/2018 for induction of labor.  Indication for induction: Favorable cervix at term.  Patient had an uncomplicated labor course as follows: Membrane Rupture Time/Date: 9:27 PM ,07/08/2018   Intrapartum Procedures: Episiotomy: None [1]                                         Lacerations:  Sulcus [9]  Patient had delivery of a Viable infant.  Information for the patient's newborn:  Wylodean, Shimmel [[id number]]      07/08/2018  Details of delivery can be found in separate delivery note.  Patient had a routine postpartum course. Patient is discharged home 07/10/18.  Physical exam  Vitals:   07/09/18 1100 07/09/18 1640 07/09/18 2208 07/10/18 0540  BP: 130/79 125/61 113/67 110/73  Pulse: 87 (!) 57 67 75  Resp: 16 17  18   Temp: 98 F (36.7 C) 98 F (36.7 C) 98.5 F (36.9 C) 98.2 F (36.8 C)  TempSrc:  Oral Oral Oral  SpO2: 100% 100% 99% 99%  Weight:      Height:       General: alert, cooperative and no distress Lochia: appropriate Uterine Fundus: firm Incision: N/A DVT Evaluation: No evidence of DVT seen on physical exam. Negative Homan's sign. No cords or calf tenderness. No significant calf/ankle edema. Labs: Lab Results   Component Value Date   WBC 11.9 (H) 07/08/2018   HGB 11.3 (L) 07/08/2018   HCT 34.4 (L) 07/08/2018   MCV 91.5 07/08/2018   PLT 230 07/08/2018   CMP Latest Ref Rng & Units 03/17/2017  Glucose 65 - 99 mg/dL 89  BUN 6 - 20 mg/dL 20  Creatinine 0.44 - 1.00 mg/dL 0.85  Sodium 135 - 145 mmol/L 137  Potassium 3.5 - 5.1 mmol/L 3.6  Chloride 101 - 111 mmol/L 103  CO2 22 - 32 mmol/L 22  Calcium 8.9 - 10.3 mg/dL 9.6  Total Protein 6.5 - 8.1 g/dL 9.0(H)  Total Bilirubin 0.3 - 1.2 mg/dL 1.5(H)  Alkaline Phos 38 - 126 U/L 51  AST 15 - 41 U/L 28  ALT 14 - 54 U/L 19    Discharge instruction: per After Visit Summary and "Baby and Me Booklet".  After visit meds:  Allergies as of 07/10/2018   No Known Allergies     Medication List    TAKE these medications   ibuprofen 600 MG tablet Commonly known as:  ADVIL,MOTRIN Take 1 tablet (600 mg total) by mouth every 6 (six) hours.   PRENATAL VITAMINS PLUS PO Take by mouth.       Diet: routine diet  Activity: Advance as tolerated. Pelvic rest for 6 weeks.   Outpatient follow up:4 weeks Follow up Appt: Future Appointments  Date Time Provider [place]  08/09/2018 10:15 AM Truett Mainland, DO [facility] None   Follow up Visit:No follow-ups on file.  Postpartum contraception: IUD Mirena  Newborn Data: Live born female  Birth Weight: 8 lb 1.4 oz (3668 g) APGAR: 8, 9  Newborn Delivery   Birth date/time:  07/08/2018 23:19:00 Delivery type:  VBAC, Spontaneous     Baby Feeding: Breast Disposition:home with mother   07/10/2018 Truett Mainland, DO

## 2018-07-10 NOTE — Progress Notes (Signed)
CSW contacted by infant's attending pediatrician and informed that MOB has an open case with [facility] CPS and that patient's older child is currently in foster care.  CSW contacted [facility] [place] [phone]) and made a CPS report to Fernande Boyden. CPS worker was unable to confirm that MOB had an open case or if her older child was in custody. CPS worker took report and reported that it would be sent to a supervisor for review and possibly transferred to [facility] due to [facility] current address. CPS worker reported that CSW would be contacted today if there was something alarming that needed to be addressed or that follow up from [place] would take place at home. CSW provided CPS with MOB's room number at hospital.   CSW will continue to follow and assist with discharge planning, currently there are barriers to discharge until CPS can confirm a safety discharge plan for infant.   Abundio Miu, [place] Worker [facility] Cell#: [phone]

## 2018-07-12 ENCOUNTER — Inpatient Hospital Stay (HOSPITAL_COMMUNITY): Admission: AD | Admit: 2018-07-12 | Payer: Medicaid Other | Source: Home / Self Care

## 2018-07-12 LAB — BPAM RBC
Blood Product Expiration Date: 201912272359
Blood Product Expiration Date: 202001042359
Unit Type and Rh: 5100
Unit Type and Rh: 6200

## 2018-07-12 LAB — TYPE AND SCREEN
ABO/RH(D): A POS
Antibody Screen: POSITIVE
Donor AG Type: NEGATIVE
Donor AG Type: NEGATIVE
UNIT DIVISION: 0
Unit division: 0

## 2018-08-09 ENCOUNTER — Ambulatory Visit: Payer: Medicaid Other | Admitting: Family Medicine

## 2018-08-09 IMAGING — CT CT RENAL STONE PROTOCOL
2 of 3 series · 16 of 46 positions shown, 18 images · non-contrast
Comparison: None.

CLINICAL DATA: Right flank pain.

EXAM:
CT ABDOMEN AND PELVIS WITHOUT CONTRAST
TECHNIQUE: Multidetector CT imaging of the abdomen and pelvis was performed
following the standard protocol without IV contrast.

[Series 3: lung · axial · 0.74mm/px · z∈[-140,-40]mm · 13 of 58 slices shown, 15 images]
[im 4/58  soft-tissue]
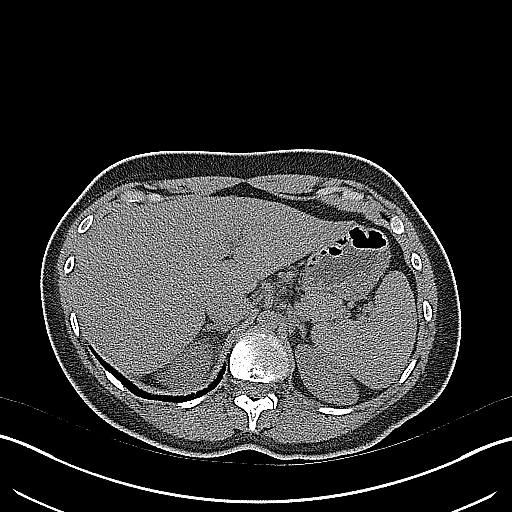
[im 4/58  bone]
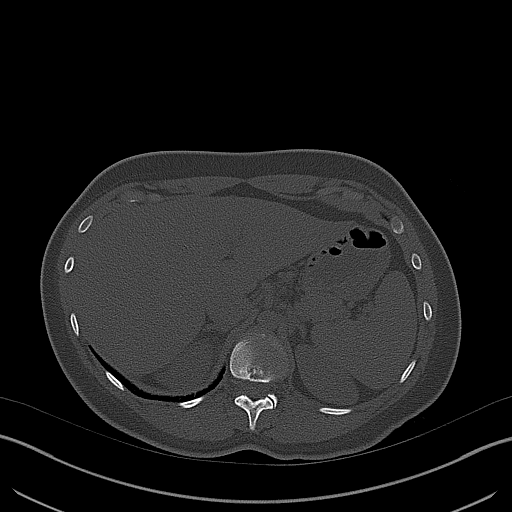
[im 8/58  soft-tissue]
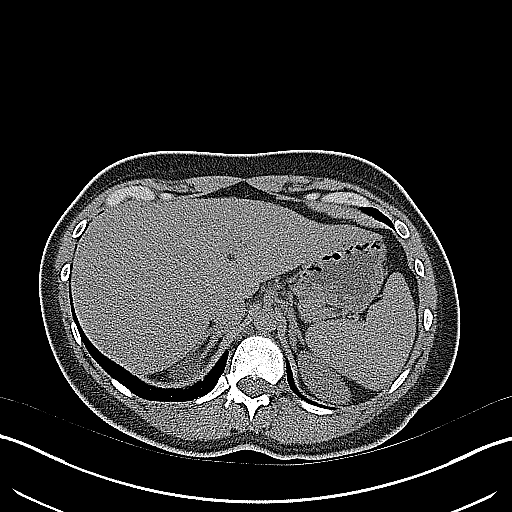
[im 12/58  soft-tissue]
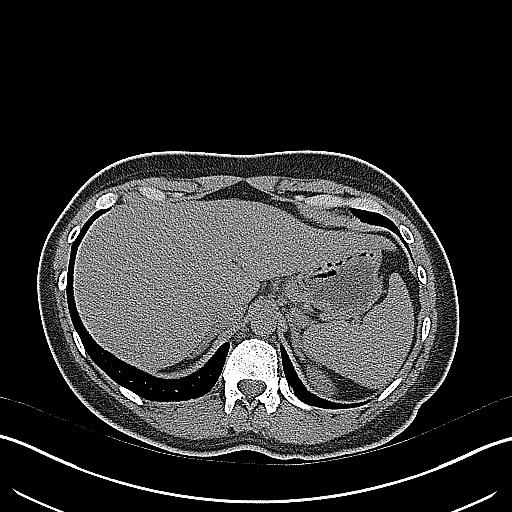
[im 17/58  soft-tissue]
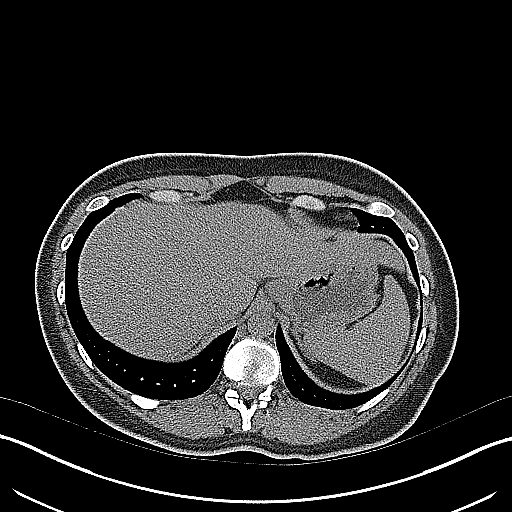
[im 21/58  soft-tissue]
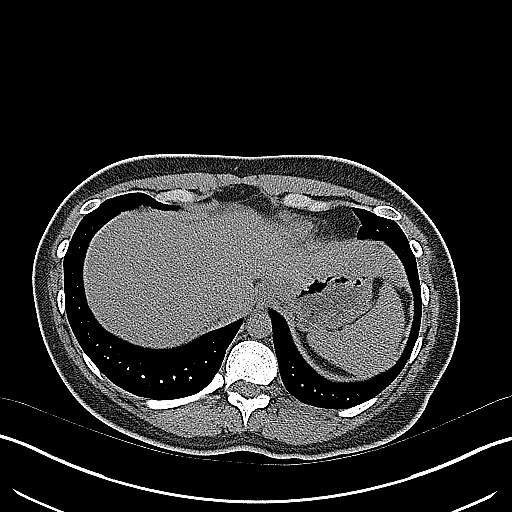
[im 24/58  soft-tissue]
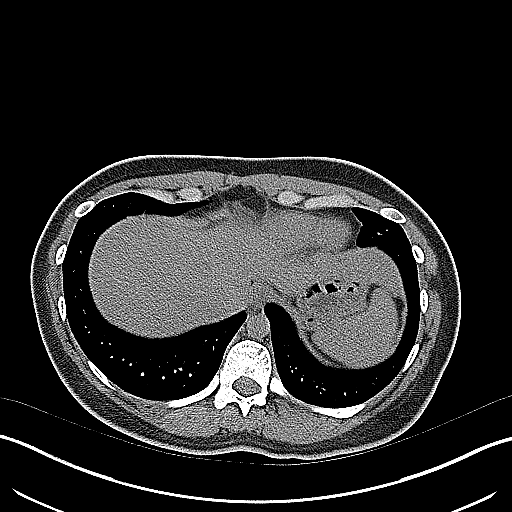
[im 30/58  soft-tissue]
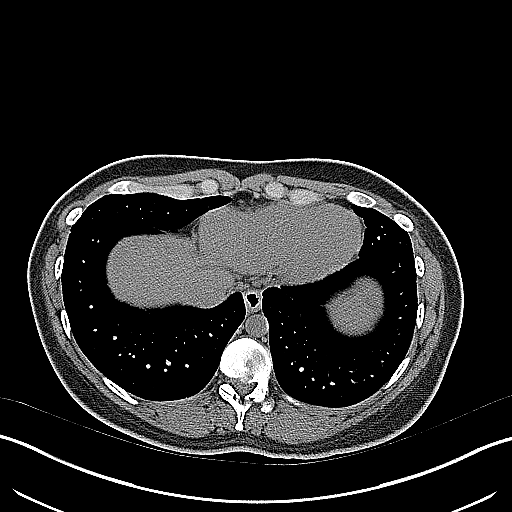
[im 34/58  soft-tissue]
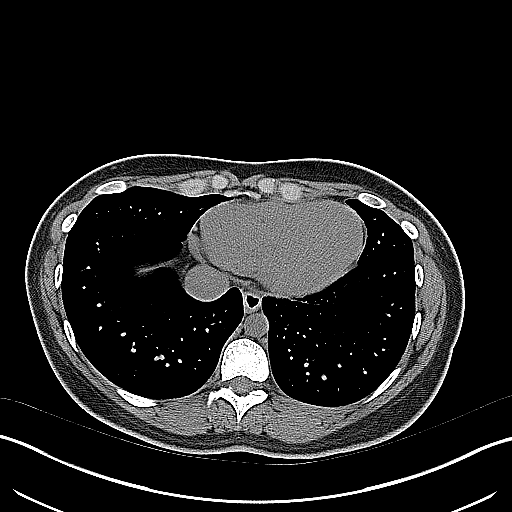
[im 37/58  soft-tissue]
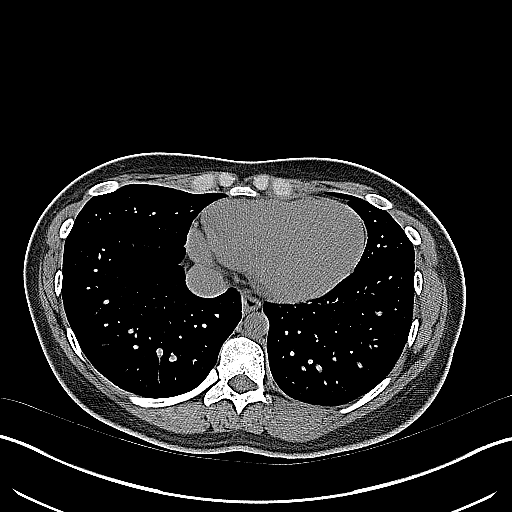
[im 37/58  bone]
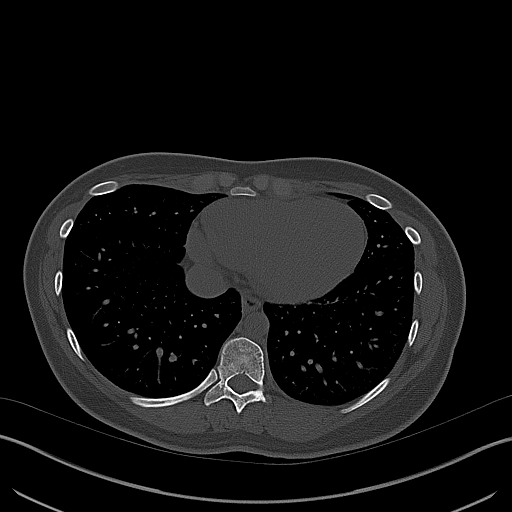
[im 41/58  soft-tissue]
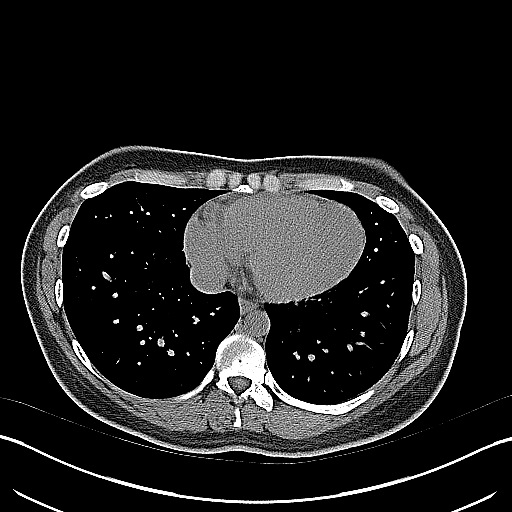
[im 46/58  soft-tissue]
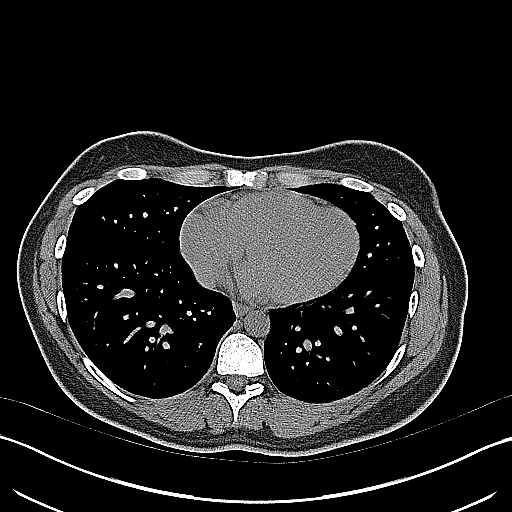
[im 50/58  soft-tissue]
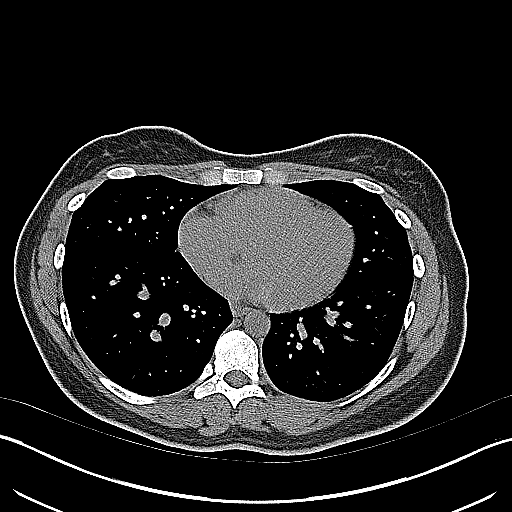
[im 54/58  soft-tissue]
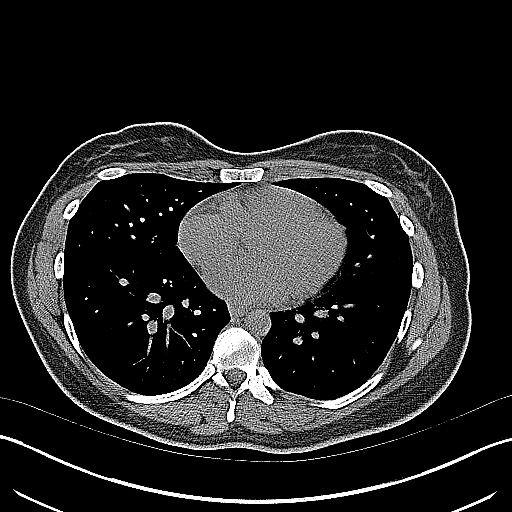

[Series 4: coronal · coronal · 0.74mm/px · 3 of 113 slices shown]
[im 38/113  soft-tissue]
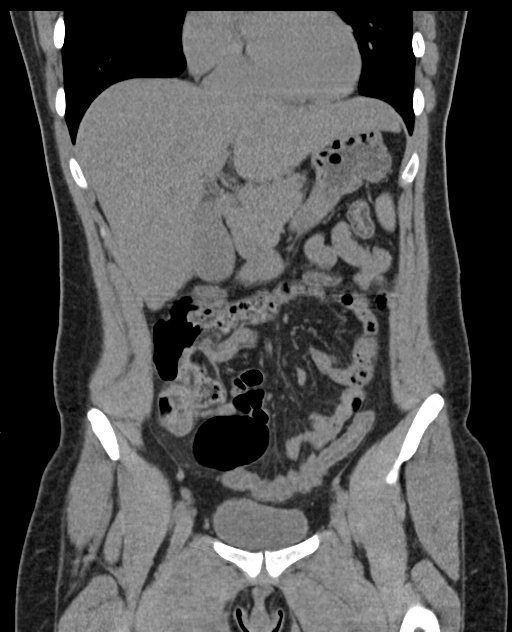
[im 50/113  soft-tissue]
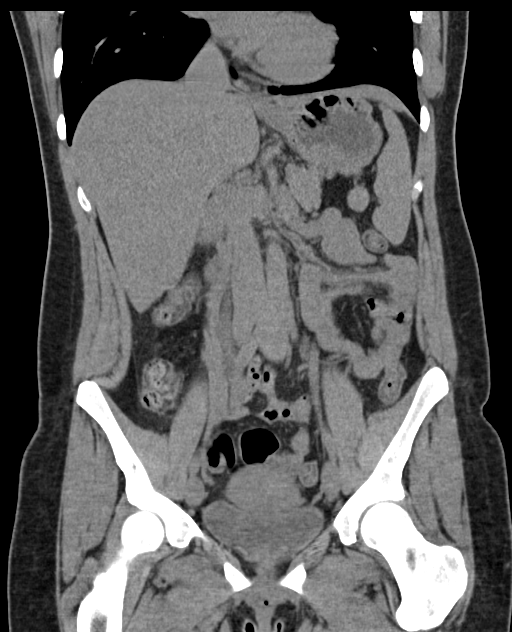
[im 63/113  soft-tissue]
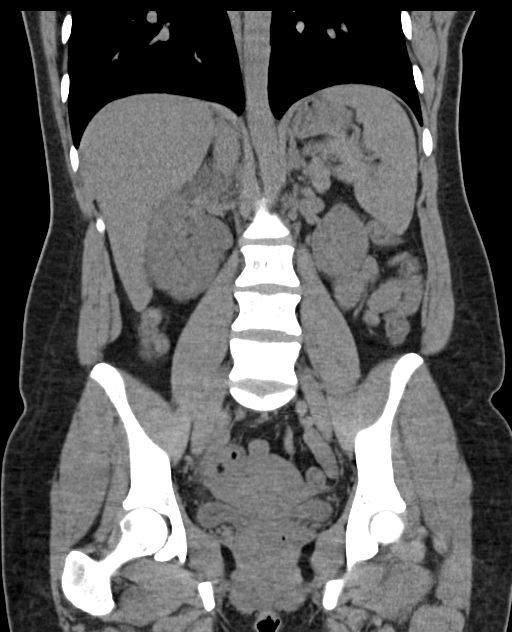

[16 of 46 positions shown; findings below may reference images not displayed]

FINDINGS: Lower chest: No acute abnormality.

Hepatobiliary: No focal liver abnormality is seen. No gallstones,
gallbladder wall thickening, or biliary dilatation.

Pancreas: Unremarkable. No pancreatic ductal dilatation or
surrounding inflammatory changes.

Spleen: Normal in size without focal abnormality.

Adrenals/Urinary Tract: The adrenal glands are normal. There is mild
hydronephrosis and moderate perinephric stranding associated with
the right kidney. There is right ureterectasis. This is due to a 4
mm stone in the distal right ureter, just above the UVJ. The bladder
is normal.

Stomach/Bowel: The stomach, small bowel, and colon are normal. The
appendix is not visualized but there is no secondary evidence of
appendicitis.

Vascular/Lymphatic: No significant vascular findings are present. No
enlarged abdominal or pelvic lymph nodes.

Reproductive: Uterus and bilateral adnexa are unremarkable.

Other: No free air or free fluid.

Musculoskeletal: No acute or significant osseous findings.
IMPRESSION: 1. There is a 4 mm stone in the distal right ureter resulting in
obstruction as above.
2. The appendix is not visualized but there is no secondary evidence
of appendicitis.

## 2018-08-23 ENCOUNTER — Telehealth: Payer: Self-pay

## 2018-08-23 NOTE — Telephone Encounter (Signed)
Called patient and left message for her to return call to office. Kathrene Alu RN

## 2018-08-26 ENCOUNTER — Encounter: Payer: Self-pay | Admitting: Family Medicine

## 2018-08-26 ENCOUNTER — Ambulatory Visit (INDEPENDENT_AMBULATORY_CARE_PROVIDER_SITE_OTHER): Payer: Medicaid Other | Admitting: Family Medicine

## 2018-08-26 DIAGNOSIS — Z3202 Encounter for pregnancy test, result negative: Secondary | ICD-10-CM | POA: Diagnosis not present

## 2018-08-26 DIAGNOSIS — Z3042 Encounter for surveillance of injectable contraceptive: Secondary | ICD-10-CM

## 2018-08-26 DIAGNOSIS — Z1389 Encounter for screening for other disorder: Secondary | ICD-10-CM | POA: Diagnosis not present

## 2018-08-26 LAB — POCT URINE PREGNANCY: Preg Test, Ur: NEGATIVE

## 2018-08-26 MED ORDER — MEDROXYPROGESTERONE ACETATE 150 MG/ML IM SUSP: 150.0000 mg | Freq: Once | INTRAMUSCULAR | 2 refills | Status: DC

## 2018-08-26 MED ORDER — MEDROXYPROGESTERONE ACETATE 150 MG/ML IM SUSP
150.0000 mg | Freq: Once | INTRAMUSCULAR | Status: AC
  Administered 2018-08-26: 150 mg via INTRAMUSCULAR

## 2018-08-26 MED FILL — medroxyPROGESTERone ACETATE: 150 | 90 days supply | Qty: 1 | Fill #0

## 2018-08-26 NOTE — Progress Notes (Signed)
Subjective:     Courtney Strong is a 28 y.o. female who presents for a postpartum visit. She is 6 weeks postpartum following a spontaneous vaginal delivery. I have fully reviewed the prenatal and intrapartum course. The delivery was at 74 gestational weeks. Outcome: spontaneous vaginal delivery. Anesthesia: epidural. Postpartum course has been normal. Baby's course has been normal. Baby is feeding by both breast and bottle - Gentle Pro . Bleeding no bleeding. Bowel function is normal. Bladder function is normal. Patient is not sexually active. Contraception method is Depo-Provera injections. Postpartum depression screening: negative.  The following portions of the patient's history were reviewed and updated as appropriate: allergies, current medications, past family history, past medical history, past social history, past surgical history and problem list.  Review of Systems Pertinent items are noted in HPI.   Objective:    LMP 10/05/2017 (Exact Date)   General:  alert and cooperative  Lungs: clear to auscultation bilaterally  Heart:  regular rate and rhythm, S1, S2 normal, no murmur, click, rub or gallop  Abdomen: soft, non-tender; bowel sounds normal; no masses,  no organomegaly        Assessment:     normal postpartum exam. Pap smear not done at today's visit.   Plan:    1. Contraception: Depo-Provera injections 3. Follow up in: 3 months or as needed.

## 2018-11-25 ENCOUNTER — Ambulatory Visit: Payer: Medicaid Other

## 2020-09-16 ENCOUNTER — Emergency Department (HOSPITAL_BASED_OUTPATIENT_CLINIC_OR_DEPARTMENT_OTHER)
Admission: EM | Admit: 2020-09-16 | Discharge: 2020-09-16 | Disposition: A | Discharge status: Home / Self Care | Payer: Medicaid Other | Attending: Emergency Medicine | Admitting: Emergency Medicine

## 2020-09-16 ENCOUNTER — Encounter (HOSPITAL_BASED_OUTPATIENT_CLINIC_OR_DEPARTMENT_OTHER): Payer: Self-pay

## 2020-09-16 ENCOUNTER — Other Ambulatory Visit: Payer: Self-pay

## 2020-09-16 DIAGNOSIS — S0083XA Contusion of other part of head, initial encounter: Secondary | ICD-10-CM

## 2020-09-16 DIAGNOSIS — S01112A Laceration without foreign body of left eyelid and periocular area, initial encounter: Secondary | ICD-10-CM | POA: Insufficient documentation

## 2020-09-16 DIAGNOSIS — W228XXA Striking against or struck by other objects, initial encounter: Secondary | ICD-10-CM | POA: Diagnosis not present

## 2020-09-16 DIAGNOSIS — F17210 Nicotine dependence, cigarettes, uncomplicated: Secondary | ICD-10-CM | POA: Diagnosis not present

## 2020-09-16 DIAGNOSIS — S0181XA Laceration without foreign body of other part of head, initial encounter: Secondary | ICD-10-CM

## 2020-09-16 DIAGNOSIS — S0993XA Unspecified injury of face, initial encounter: Secondary | ICD-10-CM | POA: Diagnosis present

## 2020-09-16 MED ORDER — LIDOCAINE-EPINEPHRINE 1 %-1:100000 IJ SOLN
10.0000 mL | Freq: Once | INTRAMUSCULAR | Status: AC
  Administered 2020-09-16: 10 mL
  Filled 2020-09-16: qty 1
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
## 2020-09-21 ENCOUNTER — Emergency Department (HOSPITAL_COMMUNITY): Payer: Medicaid Other

## 2020-09-21 ENCOUNTER — Emergency Department (HOSPITAL_COMMUNITY)
Admission: EM | Admit: 2020-09-21 | Discharge: 2020-09-22 | Disposition: A | Discharge status: Home / Self Care | Payer: Medicaid Other | Attending: Emergency Medicine | Admitting: Emergency Medicine

## 2020-09-21 ENCOUNTER — Other Ambulatory Visit: Payer: Self-pay

## 2020-09-21 ENCOUNTER — Encounter (HOSPITAL_COMMUNITY): Payer: Self-pay

## 2020-09-21 DIAGNOSIS — R1011 Right upper quadrant pain: Secondary | ICD-10-CM | POA: Diagnosis present

## 2020-09-21 DIAGNOSIS — F17210 Nicotine dependence, cigarettes, uncomplicated: Secondary | ICD-10-CM | POA: Diagnosis not present

## 2020-09-21 DIAGNOSIS — N12 Tubulo-interstitial nephritis, not specified as acute or chronic: Secondary | ICD-10-CM | POA: Insufficient documentation

## 2020-09-21 DIAGNOSIS — Z87442 Personal history of urinary calculi: Secondary | ICD-10-CM | POA: Insufficient documentation

## 2020-09-21 LAB — BASIC METABOLIC PANEL
Anion gap: 10 (ref 5–15)
BUN: <5 mg/dL — ABNORMAL LOW (ref 6–20)
CO2: 23 mmol/L (ref 22–32)
Calcium: 9.2 mg/dL (ref 8.9–10.3)
Chloride: 105 mmol/L (ref 98–111)
Creatinine, Ser: 0.53 mg/dL (ref 0.44–1.00)
GFR, Estimated: >60 mL/min (ref >60)
Glucose, Bld: 94 mg/dL (ref 70–99)
Potassium: 4.1 mmol/L (ref 3.5–5.1)
Sodium: 138 mmol/L (ref 135–145)

## 2020-09-21 LAB — URINALYSIS, ROUTINE W REFLEX MICROSCOPIC
Bilirubin Urine: NEGATIVE
Glucose, UA: NEGATIVE mg/dL
Ketones, ur: NEGATIVE mg/dL
Nitrite: POSITIVE — AB
Protein, ur: NEGATIVE mg/dL
Specific Gravity, Urine: 1.009 (ref 1.005–1.030)
WBC, UA: >50 WBC/hpf — ABNORMAL HIGH (ref 0–5)
pH: 6 (ref 5.0–8.0)

## 2020-09-21 LAB — CBC WITH DIFFERENTIAL/PLATELET
Abs Immature Granulocytes: 0.04 10*3/uL (ref 0.00–0.07)
Basophils Absolute: 0 10*3/uL (ref 0.0–0.1)
Basophils Relative: 0 %
Eosinophils Absolute: 0.1 10*3/uL (ref 0.0–0.5)
Eosinophils Relative: 1 %
HCT: 39.6 % (ref 36.0–46.0)
Hemoglobin: 13.3 g/dL (ref 12.0–15.0)
Immature Granulocytes: 0 %
Lymphocytes Relative: 22 %
Lymphs Abs: 2.3 10*3/uL (ref 0.7–4.0)
MCH: 31 pg (ref 26.0–34.0)
MCHC: 33.6 g/dL (ref 30.0–36.0)
MCV: 92.3 fL (ref 80.0–100.0)
Monocytes Absolute: 1 10*3/uL (ref 0.1–1.0)
Monocytes Relative: 10 %
Neutro Abs: 6.9 10*3/uL (ref 1.7–7.7)
Neutrophils Relative %: 67 %
Platelets: 234 10*3/uL (ref 150–400)
RBC: 4.29 MIL/uL (ref 3.87–5.11)
RDW: 12.8 % (ref 11.5–15.5)
WBC: 10.4 10*3/uL (ref 4.0–10.5)
nRBC: 0 % (ref 0.0–0.2)

## 2020-09-21 LAB — POC URINE PREG, ED: Preg Test, Ur: NEGATIVE

## 2020-09-21 MED ORDER — SODIUM CHLORIDE 0.9 % IV SOLN
1.0000 g | Freq: Once | INTRAVENOUS | Status: AC
  Administered 2020-09-22: 1 g via INTRAVENOUS
  Filled 2020-09-21: qty 10

## 2020-09-21 MED ORDER — HYDROMORPHONE HCL 1 MG/ML IJ SOLN
1.0000 mg | Freq: Once | INTRAMUSCULAR | Status: AC
  Administered 2020-09-21: 1 mg via INTRAVENOUS
  Filled 2020-09-21: qty 1

## 2020-09-21 MED ORDER — SODIUM CHLORIDE 0.9 % IV BOLUS
1000.0000 mL | Freq: Once | INTRAVENOUS | Status: AC
  Administered 2020-09-21: 1000 mL via INTRAVENOUS

## 2020-09-21 MED ORDER — ONDANSETRON HCL 4 MG/2ML IJ SOLN
4.0000 mg | Freq: Once | INTRAMUSCULAR | Status: AC
  Administered 2020-09-21: 4 mg via INTRAVENOUS
  Filled 2020-09-21: qty 2

## 2020-09-21 NOTE — ED Provider Notes (Signed)
[facility] DEPT Provider Note   CSN: [id number] Arrival date & time: 09/21/20  2202     History Chief Complaint  Patient presents with   Back Pain    Courtney Strong is a 30 y.o. female w past medical history of nephrolithiasis, presenting emergency department with complaint of gradually worsening right-sided flank pain began on Saturday.  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  She states she assisted with multiple showers of the patient's, about 18-20 that day.  She initially thought she strained her back was treating with muscle relaxer symptomatic management, ice and heat, however pain is persistently worsening.  It is worse if she lays down for long periods of time, better when she is up and moving.  Is also worse when she is laying against an ice pack. Her urine has been cloudy and malodorous, but no dysuria or frequency.  Denies fevers or chills, abdominal pain, radicular pain.  The history is provided by the patient.       Past Medical History:  Diagnosis Date   Abnormal Pap smear 2007   ASC-US    Bacterial vaginosis    Chlamydia    x3 , treated    Hx of trichomoniasis 2015   Migraines    UTI (lower urinary tract infection)     There are no problems to display for this patient.   Past Surgical History:  Procedure Laterality Date   CESAREAN SECTION       OB History    Gravida  5   Para  3   Term  3   Preterm      AB  2   Living  3     SAB  2   IAB  0   Ectopic      Multiple  0   Live Births  3           Family History  Problem Relation Age of Onset   Other Neg Hx    Hearing loss Neg Hx     Social History   Tobacco Use   Smoking status: Current Every Day Smoker    Packs/day: 0.25    Years: 10.00    Pack years: 2.50    Types: Cigarettes   Smokeless tobacco: Never Used  Scientific laboratory technician Use: Every day  Substance Use Topics   Alcohol use: Not  Currently   Drug use: Not Currently    Home Medications Prior to Admission medications   Medication Sig Start Date End Date Taking? Authorizing Provider  HYDROcodone-acetaminophen (NORCO/VICODIN) 5-325 MG tablet Take 1-2 tablets by mouth every 6 (six) hours as needed. 09/22/20  Yes Aleisa Howk, [place] N, PA-C  promethazine (PHENERGAN) 25 MG tablet Take 1 tablet (25 mg total) by mouth every 6 (six) hours as needed for nausea or vomiting. 09/22/20  Yes Quentin Cornwall, [place] N, PA-C  sulfamethoxazole-trimethoprim (BACTRIM DS) 800-160 MG tablet Take 1 tablet by mouth 2 (two) times daily for 14 days. 09/22/20 10/06/20 Yes Quentin Cornwall, [place] N, PA-C  medroxyPROGESTERone (DEPO-PROVERA) 150 MG/ML injection Inject 1 mL (150 mg total) into the muscle once for 1 dose. 08/26/18 08/26/18  Truett Mainland, DO    Allergies    Patient has no known allergies.  Review of Systems   Review of Systems  Constitutional: Negative for fever.  Genitourinary:       Cloudy and malodorous urine  Musculoskeletal: Positive for back pain.  All other  systems reviewed and are negative.   Physical Exam Updated Vital Signs BP 116/79 (BP Location: Left Arm)    Pulse 81    Temp 98.5 F (36.9 C) (Oral)    Resp 20    Ht 5\' 4"  (1.626 m)    Wt 56.7 kg    LMP 09/09/2020    SpO2 98%    BMI 21.46 kg/m   Physical Exam Vitals and nursing note reviewed.  Constitutional:      Appearance: She is well-developed and well-nourished.     Comments: Patient is tearful and appears uncomfortable. Intermittently moving about the room during evaluation. States the sudden position changes are not worsening her back pain  HENT:     Head: Normocephalic and atraumatic.  Eyes:     Conjunctiva/sclera: Conjunctivae normal.  Cardiovascular:     Rate and Rhythm: Normal rate and regular rhythm.  Pulmonary:     Effort: Pulmonary effort is normal. No respiratory distress.     Breath sounds: Normal breath sounds.  Abdominal:     Palpations: Abdomen is soft.      Tenderness: There is no abdominal tenderness.  Musculoskeletal:     Comments: Mild TTP right lower lumbar paraspinal muscle. No midline TTP. No skin changes  Skin:    General: Skin is warm.  Neurological:     Mental Status: She is alert.  Psychiatric:        Mood and Affect: Mood and affect normal.        Behavior: Behavior normal.     ED Results / Procedures / Treatments   Labs (all labs ordered are listed, but only abnormal results are displayed) Labs Reviewed  URINALYSIS, ROUTINE W REFLEX MICROSCOPIC - Abnormal; Notable for the following components:      Result Value   APPearance CLOUDY (*)    Hgb urine dipstick MODERATE (*)    Nitrite POSITIVE (*)    Leukocytes,Ua LARGE (*)    WBC, UA >50 (*)    Bacteria, UA MANY (*)    All other components within normal limits  BASIC METABOLIC PANEL - Abnormal; Notable for the following components:   BUN <5 (*)    All other components within normal limits  URINE CULTURE  CBC WITH DIFFERENTIAL/PLATELET  POC URINE PREG, ED    EKG None  Radiology CT Renal Stone Study  Result Date: 09/21/2020 CLINICAL DATA:  Right-sided lower back pain. EXAM: CT ABDOMEN AND PELVIS WITHOUT CONTRAST TECHNIQUE: Multidetector CT imaging of the abdomen and pelvis was performed following the standard protocol without IV contrast. COMPARISON:  March 17, 2017 FINDINGS: Lower chest: No acute abnormality. Hepatobiliary: No focal liver abnormality is seen. No gallstones, gallbladder wall thickening, or biliary dilatation. Pancreas: Unremarkable. No pancreatic ductal dilatation or surrounding inflammatory changes. Spleen: Normal in size without focal abnormality. Adrenals/Urinary Tract: Adrenal glands are unremarkable. Kidneys are normal, without obstructing renal calculi, focal lesion, or hydronephrosis. Bladder is unremarkable. Stomach/Bowel: Stomach is within normal limits. The appendix is not clearly identified. No evidence of bowel wall thickening, distention, or  inflammatory changes. Vascular/Lymphatic: No significant vascular findings are present. No enlarged abdominal or pelvic lymph nodes. Reproductive: Uterus and bilateral adnexa are unremarkable. Other: No abdominal wall hernia or abnormality. No abdominopelvic ascites. Musculoskeletal: No acute or significant osseous findings. IMPRESSION: Poorly visualized appendix without CT evidence of acute intra-abdominal pathology. Correlation with abdomen pelvis CT following the administration of oral and intravenous contrast should be considered if appendicitis is of clinical concern. Electronically Signed  By: Virgina Norfolk M.D.   On: 09/21/2020 23:49    Procedures Procedures   Medications Ordered in ED Medications  cefTRIAXone (ROCEPHIN) 1 g in sodium chloride 0.9 % 100 mL IVPB (1 g Intravenous New Bag/Given 09/22/20 0007)  promethazine (PHENERGAN) injection 25 mg (has no administration in time range)  ondansetron (ZOFRAN) injection 4 mg (4 mg Intravenous Given 09/21/20 2309)  HYDROmorphone (DILAUDID) injection 1 mg (1 mg Intravenous Given 09/21/20 2309)  sodium chloride 0.9 % bolus 1,000 mL (1,000 mLs Intravenous Bolus from Bag 09/21/20 2313)    ED Course  I have reviewed the triage vital signs and the nursing notes.  Pertinent labs & imaging results that were available during my care of the patient were reviewed by me and considered in my medical decision making (see chart for details).  Clinical Course as of 09/22/20 0032  Tue Sep 21, 2020  2357 Patient reports improvement in symptoms after medications. Discussed diagnosis of pyelonephritis based on presentation and abnormal UA. Stone study is negative for nephrolithiasis [JR]    Clinical Course User Index [JR] Mando Blatz, [place] N, PA-C   MDM Rules/Calculators/A&P                          Patient presenting with right-sided low back pain and urinary symptoms began a few days ago.  Initially thought it was muscle strain, however presentation is not  typical for musculoskeletal pain.  She also endorses cloudy and malodorous urine.  History of nephrolithiasis as well.  Symptoms treated with IV fluids, antiemetics and pain medication.  Blood work, UA, CT stone study ordered.  Stone study is negative for nephrolithiasis.  UA is nitrite positive and consistent with infection.  Urine culture is sent.  Blood work is reassuring.  Patient with significant improvement on reevaluation, tolerating p.o. fluids.  She is given a dose of IV Rocephin will be treated with Bactrim x14 days for pyelonephritis.  Instructed close PCP follow-up and strict return precautions, including fever, uncontrollable vomiting, worsening pain, or lack of improvement.  Patient is discharged in no distress.  [place] Controlled Substance reporting System queried  Final Clinical Impression(s) / ED Diagnoses Final diagnoses:  Pyelonephritis    Rx / DC Orders ED Discharge Orders         Ordered    HYDROcodone-acetaminophen (NORCO/VICODIN) 5-325 MG tablet  Every 6 hours PRN        09/22/20 0013    promethazine (PHENERGAN) 25 MG tablet  Every 6 hours PRN        09/22/20 0013    sulfamethoxazole-trimethoprim (BACTRIM DS) 800-160 MG tablet  2 times daily        09/22/20 0013           Royston Bekele, [place] N, PA-C 09/22/20 0032    Breck Coons, MD 09/23/20 1155

## 2020-09-21 NOTE — ED Notes (Signed)
Pt ambulatory to bathroom unassisted.

## 2020-09-21 NOTE — Discharge Instructions (Signed)
It is important that you stay hydrated. Take the antibiotic, bactrim, as directed until gone. You can take phenergan as directed for nausea. Take 600mg  ibuprofen every 6 hours for pain. You can take the hydrocodone every 6 hours as needed for more severe pain. Be aware this medication can make you drowsy. Follow-up with your primary care provider within 3 to 5 days. Return to the emergency department for high fever, uncontrollable vomiting, or severely worsening symptoms.

## 2020-09-21 NOTE — ED Triage Notes (Signed)
Pt presents from home, c/o R sided lower back/flank pain starting Saturday. States it started hurting while she was at work. Has tried home remedies without improvement. Denies fevers, n/v/d, urinary symptoms

## 2020-09-22 MED ORDER — PROMETHAZINE HCL 25 MG PO TABS
25.0000 mg | ORAL_TABLET | Freq: Once | ORAL | Status: AC
  Administered 2020-09-22: 25 mg via ORAL
  Filled 2020-09-22: qty 1

## 2020-09-22 MED ORDER — HYDROCODONE-ACETAMINOPHEN 5-325 MG PO TABS: 1.0000 | ORAL_TABLET | Freq: Four times a day (QID) | ORAL | 0 refills | Status: DC | PRN

## 2020-09-22 MED ORDER — SULFAMETHOXAZOLE-TRIMETHOPRIM 800-160 MG PO TABS: 1.0000 | ORAL_TABLET | Freq: Two times a day (BID) | ORAL | 0 refills | Status: AC

## 2020-09-22 MED ORDER — PROMETHAZINE HCL 25 MG PO TABS: 25.0000 mg | ORAL_TABLET | Freq: Four times a day (QID) | ORAL | 0 refills | Status: DC | PRN

## 2020-09-22 MED ORDER — PROMETHAZINE HCL 25 MG/ML IJ SOLN
25.0000 mg | Freq: Once | INTRAMUSCULAR | Status: DC
  Filled 2020-09-22: qty 1

## 2020-09-22 NOTE — ED Notes (Signed)
Pt verbalized understanding of d/c, medications and follow up.  Overall pain decreased to 5/10 with medication.  Pt tolerated PO challenge using water.

## 2020-09-24 LAB — URINE CULTURE: Culture: >=100000 — AB

## 2020-09-26 ENCOUNTER — Telehealth: Payer: Self-pay | Admitting: Emergency Medicine

## 2020-09-26 NOTE — Telephone Encounter (Signed)
Post ED Visit - Positive Culture Follow-up  Culture report reviewed by antimicrobial stewardship pharmacist: [facility] Team []  Elenor Quinones, Pharm.D. []  Heide Guile, Pharm.D., BCPS AQ-ID []  Parks Neptune, Pharm.D., BCPS []  Alycia Rossetti, Pharm.D., BCPS []  [place], Pharm.D., BCPS, AAHIVP []  Legrand Como, Pharm.D., BCPS, AAHIVP []  Salome Arnt, PharmD, BCPS []  Johnnette Gourd, PharmD, BCPS []  Hughes Better, PharmD, BCPS []  Leeroy Cha, PharmD []  Laqueta Linden, PharmD, BCPS []  Albertina Parr, PharmD  [facility] Team []  Leodis Sias, PharmD []  Lindell Spar, PharmD []  Royetta Asal, PharmD []  Graylin Shiver, Rph []  Rema Fendt) Glennon Mac, PharmD []  Arlyn Dunning, PharmD []  Netta Cedars, PharmD []  Dia Sitter, PharmD []  Leone Haven, PharmD []  Gretta Arab, PharmD []  Theodis Shove, PharmD []  Peggyann Juba, PharmD [x]  Reuel Boom, PharmD   Positive urine culture Treated with Sulfamethoxazole-trimethoprim, organism sensitive to the same and no further patient follow-up is required at this time.  Sandi Raveling Gammons 09/26/2020, 12:26 PM

## 2020-11-12 ENCOUNTER — Other Ambulatory Visit: Payer: Self-pay

## 2020-11-12 ENCOUNTER — Encounter (HOSPITAL_COMMUNITY): Payer: Self-pay

## 2020-11-12 ENCOUNTER — Emergency Department (HOSPITAL_COMMUNITY)
Admission: EM | Admit: 2020-11-12 | Discharge: 2020-11-12 | Disposition: A | Discharge status: Home / Self Care | Payer: Medicaid Other | Attending: Emergency Medicine | Admitting: Emergency Medicine

## 2020-11-12 DIAGNOSIS — F17210 Nicotine dependence, cigarettes, uncomplicated: Secondary | ICD-10-CM | POA: Diagnosis not present

## 2020-11-12 DIAGNOSIS — A549 Gonococcal infection, unspecified: Secondary | ICD-10-CM | POA: Insufficient documentation

## 2020-11-12 DIAGNOSIS — A599 Trichomoniasis, unspecified: Secondary | ICD-10-CM | POA: Diagnosis not present

## 2020-11-12 DIAGNOSIS — Z202 Contact with and (suspected) exposure to infections with a predominantly sexual mode of transmission: Secondary | ICD-10-CM | POA: Insufficient documentation

## 2020-11-12 LAB — WET PREP, GENITAL
Sperm: NONE SEEN
Yeast Wet Prep HPF POC: NONE SEEN

## 2020-11-12 MED ORDER — METRONIDAZOLE 500 MG PO TABS
2000.0000 mg | ORAL_TABLET | Freq: Once | ORAL | Status: AC
  Administered 2020-11-12: 2000 mg via ORAL
  Filled 2020-11-12: qty 4

## 2020-11-12 MED ORDER — STERILE WATER FOR INJECTION IJ SOLN
INTRAMUSCULAR | Status: AC
  Administered 2020-11-12: 1 mL
  Filled 2020-11-12: qty 10

## 2020-11-12 MED ORDER — METRONIDAZOLE 500 MG PO TABS: 500.0000 mg | ORAL_TABLET | Freq: Two times a day (BID) | ORAL | 0 refills | Status: DC

## 2020-11-12 MED ORDER — CEFTRIAXONE SODIUM 1 G IJ SOLR
500.0000 mg | Freq: Once | INTRAMUSCULAR | Status: AC
  Administered 2020-11-12: 500 mg via INTRAMUSCULAR
  Filled 2020-11-12: qty 10

## 2020-11-12 MED ORDER — AZITHROMYCIN 250 MG PO TABS
1000.0000 mg | ORAL_TABLET | Freq: Once | ORAL | Status: AC
  Administered 2020-11-12: 1000 mg via ORAL
  Filled 2020-11-12: qty 4

## 2020-11-12 NOTE — Discharge Instructions (Addendum)
Your swab shows trichomonas and bacterial vaginosis.  Trichomonas is a STD.  You received treatment for trichomonas today.   Swab for gonorrhea and chlamydia are pending, check results on [facility] in the next few days. You were already treated for these today.    Please take 500 mg flagyl twice daily for 7 days for treatment of bacterial vaginosis.  Condoms are the only way to prevent STD.  Do not have sexual encounters for the next 10 days after treatment today  Return for worsening or new symptoms
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
## 2020-11-12 NOTE — ED Provider Notes (Signed)
[facility] DEPT Provider Note   CSN: [id number] Arrival date & time: 11/12/20  1011     History Chief Complaint  Patient presents with  . SEXUALLY TRANSMITTED DISEASE    Courtney Strong is a 30 y.o. female presents to the ED for evaluation of concern for gonorrhea.  States her baby's father gave her gonorrhea.  She saw his STD results on his MyChart.  She has had gray, runny vaginal discharge for 3 days.  States the outside of her vaginal "lips" is irritated.  States that she scrubbed her vagina with a loofah. Denies fevers, chills, pelvic pain. LMP March 23. No interventions. No modifying factors. Tried to go to the health department but they were only making appointment in 1 month.   HPI     Past Medical History:  Diagnosis Date  . Abnormal Pap smear 2007   ASC-US   . Bacterial vaginosis   . Chlamydia    x3 , treated   . Hx of trichomoniasis 2015  . Migraines   . UTI (lower urinary tract infection)     There are no problems to display for this patient.   Past Surgical History:  Procedure Laterality Date  . CESAREAN SECTION       OB History    Gravida  5   Para  3   Term  3   Preterm      AB  2   Living  3     SAB  2   IAB  0   Ectopic      Multiple  0   Live Births  3           Family History  Problem Relation Age of Onset  . Other Neg Hx   . Hearing loss Neg Hx     Social History   Tobacco Use  . Smoking status: Current Every Day Smoker    Packs/day: 0.25    Years: 10.00    Pack years: 2.50    Types: Cigarettes  . Smokeless tobacco: Never Used  Vaping Use  . Vaping Use: Every day  Substance Use Topics  . Alcohol use: Not Currently  . Drug use: Not Currently    Home Medications Prior to Admission medications   Medication Sig Start Date End Date Taking? Authorizing Provider  metroNIDAZOLE (FLAGYL) 500 MG tablet Take 1 tablet (500 mg total) by mouth 2 (two) times daily. 11/12/20  Yes  Kinnie Feil, PA-C  HYDROcodone-acetaminophen (NORCO/VICODIN) 5-325 MG tablet Take 1-2 tablets by mouth every 6 (six) hours as needed. 09/22/20   Robinson, [place] N, PA-C  medroxyPROGESTERone (DEPO-PROVERA) 150 MG/ML injection Inject 1 mL (150 mg total) into the muscle once for 1 dose. 08/26/18 08/26/18  Truett Mainland, DO  promethazine (PHENERGAN) 25 MG tablet Take 1 tablet (25 mg total) by mouth every 6 (six) hours as needed for nausea or vomiting. 09/22/20   Robinson, [place] N, PA-C    Allergies    Patient has no known allergies.  Review of Systems   Review of Systems  Genitourinary: Positive for vaginal discharge.  All other systems reviewed and are negative.   Physical Exam Updated Vital Signs BP 116/64 (BP Location: Left Arm)   Pulse 94   Temp 98.5 F (36.9 C) (Oral)   Resp 18   Ht 5\' 4"  (1.626 m)   Wt 56.7 kg   LMP 10/18/2020 (Exact Date)   SpO2 100%   BMI 21.46 kg/m  Physical Exam Vitals and nursing note reviewed.  Constitutional:      General: She is not in acute distress.    Appearance: She is well-developed.     Comments: NAD.  HENT:     Head: Normocephalic and atraumatic.     Right Ear: External ear normal.     Left Ear: External ear normal.     Nose: Nose normal.  Eyes:     General: No scleral icterus.    Conjunctiva/sclera: Conjunctivae normal.  Cardiovascular:     Rate and Rhythm: Normal rate and regular rhythm.     Heart sounds: Normal heart sounds. No murmur heard.   Pulmonary:     Effort: Pulmonary effort is normal.     Breath sounds: Normal breath sounds.  Genitourinary:    Vagina: Vaginal discharge present.     Comments:  Exam performed with RN at bedside for assistance. External genitalia without lesions.  Minimal erythema/raw skin labia minora. No vesicular lesions, abrasions, abscess, cellulitis. No groin lymphadenopathy.  Vaginal mucosa and cervix pink without lesions.  Cervix visualized and without lesions, closed.  Moderate runny white  discharge in vaginal vault noted.  No CMT.  Nonpalpable, nontender adnexa.  Perianal skin normal without lesions. Musculoskeletal:        General: No deformity. Normal range of motion.     Cervical back: Normal range of motion and neck supple.  Skin:    General: Skin is warm and dry.     Capillary Refill: Capillary refill takes less than 2 seconds.  Neurological:     Mental Status: She is alert and oriented to person, place, and time.  Psychiatric:        Behavior: Behavior normal.        Thought Content: Thought content normal.        Judgment: Judgment normal.     ED Results / Procedures / Treatments   Labs (all labs ordered are listed, but only abnormal results are displayed) Labs Reviewed  WET PREP, GENITAL - Abnormal; Notable for the following components:      Result Value   Trich, Wet Prep PRESENT (*)    Clue Cells Wet Prep HPF POC PRESENT (*)    WBC, Wet Prep HPF POC MODERATE (*)    All other components within normal limits  GC/CHLAMYDIA PROBE AMP ([facility]) NOT AT [facility]    EKG None  Radiology No results found.  Procedures Procedures   Medications Ordered in ED Medications  metroNIDAZOLE (FLAGYL) tablet 2,000 mg (has no administration in time range)  cefTRIAXone (ROCEPHIN) injection 500 mg (500 mg Intramuscular Given 11/12/20 1135)  azithromycin (ZITHROMAX) tablet 1,000 mg (1,000 mg Oral Given 11/12/20 1133)  sterile water (preservative free) injection (1 mL  Given 11/12/20 1139)    ED Course  I have reviewed the triage vital signs and the nursing notes.  Pertinent labs & imaging results that were available during my care of the patient were reviewed by me and considered in my medical decision making (see chart for details).    MDM Rules/Calculators/A&P                          30 year old female presents the ED with concerns of STD exposure.  Reports 3 days of vaginal discharge and external genitalia irritation.  Has been scrubbing herself with a loofah.   Exam reveals vaginal discharge and minimal irritated labia minora but no vesicular lesions.  No CMT.  No  systemic symptoms.  She has no UTI symptoms.  Wet prep shows clue cells and trichomonas.  Patient received Flagyl here and empirically treated for gonorrhea and chlamydia.  Will discharge with Flagyl as well for BV.  Encourage condom use.  Return precautions given.  Final Clinical Impression(s) / ED Diagnoses Final diagnoses:  STD exposure  Trichomoniasis    Rx / DC Orders ED Discharge Orders         Ordered    metroNIDAZOLE (FLAGYL) 500 MG tablet  2 times daily        11/12/20 1155           Kinnie Feil, [place] 11/12/20 1157    Pattricia Boss, MD 11/15/20 1404

## 2020-11-15 LAB — GC/CHLAMYDIA PROBE AMP (~~LOC~~) NOT AT ARMC
Chlamydia: NEGATIVE
Comment: NEGATIVE
Comment: NORMAL
Neisseria Gonorrhea: NEGATIVE

## 2021-01-28 ENCOUNTER — Ambulatory Visit (HOSPITAL_COMMUNITY)
Admission: EM | Admit: 2021-01-28 | Discharge: 2021-01-28 | Disposition: A | Discharge status: Home / Self Care | Payer: Medicaid Other | Attending: Physician Assistant | Admitting: Physician Assistant

## 2021-01-28 ENCOUNTER — Other Ambulatory Visit: Payer: Self-pay

## 2021-01-28 ENCOUNTER — Encounter (HOSPITAL_COMMUNITY): Payer: Self-pay | Admitting: Emergency Medicine

## 2021-01-28 DIAGNOSIS — Z20822 Contact with and (suspected) exposure to covid-19: Secondary | ICD-10-CM | POA: Diagnosis not present

## 2021-01-28 DIAGNOSIS — N898 Other specified noninflammatory disorders of vagina: Secondary | ICD-10-CM | POA: Insufficient documentation

## 2021-01-28 DIAGNOSIS — J029 Acute pharyngitis, unspecified: Secondary | ICD-10-CM | POA: Insufficient documentation

## 2021-01-28 LAB — POCT RAPID STREP A, ED / UC: Streptococcus, Group A Screen (Direct): NEGATIVE

## 2021-01-28 NOTE — Discharge Instructions (Addendum)
Your covid and std test are pending.  Tylenol every 4 hours for fever and sore throat

## 2021-01-28 NOTE — ED Triage Notes (Signed)
Sore throat for 1-2 days, reports swollen lymph nodes in right neck.   Vaginal discharge and itch for 1 week.

## 2021-01-29 LAB — SARS CORONAVIRUS 2 (TAT 6-24 HRS): SARS Coronavirus 2: NEGATIVE

## 2021-01-29 NOTE — ED Provider Notes (Signed)
[facility]    CSN: [id number] Arrival date & time: 01/28/21  [place]      History   Chief Complaint Chief Complaint  Patient presents with   Vaginal Discharge   Sore Throat    HPI Courtney Strong is a 30 y.o. female.   Pt reports she as also had vaginal discharge   The history is provided by the patient. No language interpreter was used.  Sore Throat This is a new problem. The current episode started yesterday. The problem occurs constantly. The problem has not changed since onset.Pertinent negatives include no abdominal pain. Nothing aggravates the symptoms. Nothing relieves the symptoms. She has tried nothing for the symptoms.   Past Medical History:  Diagnosis Date   Abnormal Pap smear 2007   ASC-US    Bacterial vaginosis    Chlamydia    x3 , treated    Hx of trichomoniasis 2015   Migraines    UTI (lower urinary tract infection)     There are no problems to display for this patient.   Past Surgical History:  Procedure Laterality Date   CESAREAN SECTION      OB History     Gravida  5   Para  3   Term  3   Preterm      AB  2   Living  3      SAB  2   IAB  0   Ectopic      Multiple  0   Live Births  3            Home Medications    Prior to Admission medications   Medication Sig Start Date End Date Taking? Authorizing Provider  levonorgestrel-ethinyl estradiol (NORDETTE) 0.15-30 MG-MCG tablet Take 1 tablet by mouth daily.   Yes [provider]  HYDROcodone-acetaminophen (NORCO/VICODIN) 5-325 MG tablet Take 1-2 tablets by mouth every 6 (six) hours as needed. 09/22/20   Robinson, [place] N, PA-C  medroxyPROGESTERone (DEPO-PROVERA) 150 MG/ML injection Inject 1 mL (150 mg total) into the muscle once for 1 dose. 08/26/18 08/26/18  Truett Mainland, DO  metroNIDAZOLE (FLAGYL) 500 MG tablet Take 1 tablet (500 mg total) by mouth 2 (two) times daily. 11/12/20   Kinnie Feil, PA-C  promethazine (PHENERGAN) 25 MG tablet  Take 1 tablet (25 mg total) by mouth every 6 (six) hours as needed for nausea or vomiting. 09/22/20   Robinson, [place] N, PA-C    Family History Family History  Problem Relation Age of Onset   Other Neg Hx    Hearing loss Neg Hx     Social History Social History   Tobacco Use   Smoking status: Every Day    Packs/day: 0.25    Years: 10.00    Pack years: 2.50    Types: Cigarettes   Smokeless tobacco: Never  Vaping Use   Vaping Use: Every day  Substance Use Topics   Alcohol use: Not Currently   Drug use: Not Currently     Allergies   Patient has no known allergies.   Review of Systems Review of Systems  Gastrointestinal:  Negative for abdominal pain.  All other systems reviewed and are negative.   Physical Exam Triage Vital Signs ED Triage Vitals  Enc Vitals Group     BP 01/28/21 1855 (!) 106/51     Pulse Rate 01/28/21 1855 78     Resp 01/28/21 1855 16     Temp 01/28/21 1855 99.1 F (  37.3 C)     Temp Source 01/28/21 1855 Oral     SpO2 01/28/21 1855 98 %     Weight --      Height --      Head Circumference --      Peak Flow --      Pain Score 01/28/21 1852 9     Pain Loc --      Pain Edu? --      Excl. in [place]? --    No data found.  Updated Vital Signs BP (!) 106/51   Pulse 78   Temp 99.1 F (37.3 C) (Oral)   Resp 16   LMP 01/16/2021   SpO2 98%   Visual Acuity Right Eye Distance:   Left Eye Distance:   Bilateral Distance:    Right Eye Near:   Left Eye Near:    Bilateral Near:     Physical Exam Vitals and nursing note reviewed.  Constitutional:      Appearance: She is well-developed.  HENT:     Head: Normocephalic.     Mouth/Throat:     Mouth: Mucous membranes are moist.     Pharynx: Posterior oropharyngeal erythema present. No pharyngeal swelling.  Cardiovascular:     Rate and Rhythm: Normal rate.  Pulmonary:     Effort: Pulmonary effort is normal.  Abdominal:     General: There is no distension.  Musculoskeletal:        General:  Normal range of motion.     Cervical back: Normal range of motion.  Skin:    General: Skin is warm.  Neurological:     Mental Status: She is alert and oriented to person, place, and time.  Psychiatric:        Mood and Affect: Mood normal.     UC Treatments / Results  Labs (all labs ordered are listed, but only abnormal results are displayed) Labs Reviewed  SARS CORONAVIRUS 2 (TAT 6-24 HRS)  CULTURE, GROUP A STREP [facility])  POCT RAPID STREP A, ED / UC  CERVICOVAGINAL ANCILLARY ONLY    EKG   Radiology No results found.  Procedures Procedures (including critical care time)  Medications Ordered in UC Medications - No data to display  Initial Impression / Assessment and Plan / UC Course  I have reviewed the triage vital signs and the nursing notes.  Pertinent labs & imaging results that were available during my care of the patient were reviewed by me and considered in my medical decision making (see chart for details).     MDM:  Strep is negative,  Pt did a vaginal swab for gc and ct   Final Clinical Impressions(s) / UC Diagnoses   Final diagnoses:  Pharyngitis, unspecified etiology  Vaginal discharge     Discharge Instructions      Your covid and std test are pending.  Tylenol every 4 hours for fever and sore throat     ED Prescriptions   None    PDMP not reviewed this encounter. An After Visit Summary was printed and given to the patient.    Fransico Meadow, [place] 01/29/21 1600

## 2021-01-31 LAB — CERVICOVAGINAL ANCILLARY ONLY
Bacterial Vaginitis (gardnerella): POSITIVE — AB
Candida Glabrata: NEGATIVE
Candida Vaginitis: NEGATIVE
Chlamydia: NEGATIVE
Comment: NEGATIVE
Comment: NEGATIVE
Comment: NEGATIVE
Comment: NEGATIVE
Comment: NEGATIVE
Comment: NORMAL
Neisseria Gonorrhea: NEGATIVE
Trichomonas: NEGATIVE

## 2021-01-31 LAB — CULTURE, GROUP A STREP (THRC)

## 2021-02-02 ENCOUNTER — Telehealth (HOSPITAL_COMMUNITY): Payer: Self-pay | Admitting: Emergency Medicine

## 2021-02-02 ENCOUNTER — Other Ambulatory Visit (HOSPITAL_BASED_OUTPATIENT_CLINIC_OR_DEPARTMENT_OTHER): Payer: Self-pay

## 2021-02-02 MED ORDER — METRONIDAZOLE 500 MG PO TABS
500.0000 mg | ORAL_TABLET | Freq: Two times a day (BID) | ORAL | 0 refills | Status: DC
Start: 2021-02-02 — End: 2021-03-07
  Filled 2021-02-02: qty 14, 7d supply, fill #0

## 2021-02-19 ENCOUNTER — Other Ambulatory Visit: Payer: Self-pay

## 2021-02-19 ENCOUNTER — Encounter (HOSPITAL_COMMUNITY): Payer: Self-pay

## 2021-02-19 ENCOUNTER — Ambulatory Visit (HOSPITAL_COMMUNITY)
Admission: EM | Admit: 2021-02-19 | Discharge: 2021-02-19 | Disposition: A | Discharge status: Home / Self Care | Payer: Medicaid Other | Attending: Student | Admitting: Student

## 2021-02-19 DIAGNOSIS — J029 Acute pharyngitis, unspecified: Secondary | ICD-10-CM | POA: Diagnosis present

## 2021-02-19 LAB — POCT RAPID STREP A, ED / UC: Streptococcus, Group A Screen (Direct): NEGATIVE

## 2021-02-19 MED ORDER — LIDOCAINE VISCOUS HCL 2 % MT SOLN: 5.0000 mL | Freq: Three times a day (TID) | OROMUCOSAL | 0 refills | Status: DC | PRN

## 2021-02-19 NOTE — Discharge Instructions (Addendum)
-  Magic mouthwash up to 3 times daily for mouth pain.  This will help with your throat/tongue discomfort and canker sores. -We are screening your throat for bacterial infection, this will come back in about 2 days.  We will call you if anything is positive. -For additional relief- Take Tylenol 1000 mg 3 times daily, and ibuprofen 800 mg 3 times daily with food.  You can take these together, or alternate every 3-4 hours.

## 2021-02-19 NOTE — ED Provider Notes (Signed)
[facility]    CSN: [id number] Arrival date & time: 02/19/21  1019      History   Chief Complaint Chief Complaint  Patient presents with   Sore Throat    HPI Courtney Strong is a 30 y.o. female presenting with continued sore throat x2 weeks. Medical history chlamydia, trichomonas, UTI, BV. Last diagnosed with BV on 01/28/21 and was treated appropriately.  Notes continued sore throat with some canker sores on the tongue. Denies fevers/chills, n/v/d, shortness of breath, chest pain, cough, congestion, facial pain, teeth pain, headaches, loss of taste/smell, swollen lymph nodes, ear pain, trouble swallowing, dizziness.  HPI  Past Medical History:  Diagnosis Date   Abnormal Pap smear 2007   ASC-US    Bacterial vaginosis    Chlamydia    x3 , treated    Hx of trichomoniasis 2015   Migraines    UTI (lower urinary tract infection)     There are no problems to display for this patient.   Past Surgical History:  Procedure Laterality Date   CESAREAN SECTION      OB History     Gravida  5   Para  3   Term  3   Preterm      AB  2   Living  3      SAB  2   IAB  0   Ectopic      Multiple  0   Live Births  3            Home Medications    Prior to Admission medications   Medication Sig Start Date End Date Taking? Authorizing Provider  magic mouthwash (lidocaine, diphenhydrAMINE, alum & mag hydroxide) suspension Swish and spit 5 mLs 3 (three) times daily as needed for mouth pain. 02/19/21  Yes Hazel Sams, PA-C  HYDROcodone-acetaminophen (NORCO/VICODIN) 5-325 MG tablet Take 1-2 tablets by mouth every 6 (six) hours as needed. 09/22/20   Robinson, [place] N, PA-C  levonorgestrel-ethinyl estradiol (NORDETTE) 0.15-30 MG-MCG tablet Take 1 tablet by mouth daily.    [provider]  medroxyPROGESTERone (DEPO-PROVERA) 150 MG/ML injection Inject 1 mL (150 mg total) into the muscle once for 1 dose. 08/26/18 08/26/18  Truett Mainland, DO   metroNIDAZOLE (FLAGYL) 500 MG tablet Take 1 tablet (500 mg total) by mouth 2 (two) times daily. 02/02/21   Lamptey, Myrene Galas, MD  promethazine (PHENERGAN) 25 MG tablet Take 1 tablet (25 mg total) by mouth every 6 (six) hours as needed for nausea or vomiting. 09/22/20   Robinson, [place] N, PA-C    Family History Family History  Problem Relation Age of Onset   Other Neg Hx    Hearing loss Neg Hx     Social History Social History   Tobacco Use   Smoking status: Every Day    Packs/day: 0.25    Years: 10.00    Pack years: 2.50    Types: Cigarettes   Smokeless tobacco: Never  Vaping Use   Vaping Use: Every day  Substance Use Topics   Alcohol use: Not Currently   Drug use: Not Currently     Allergies   Patient has no known allergies.   Review of Systems Review of Systems  Constitutional:  Negative for appetite change, chills and fever.  HENT:  Positive for sore throat. Negative for congestion, ear pain, rhinorrhea, sinus pressure and sinus pain.   Eyes:  Negative for redness and visual disturbance.  Respiratory:  Negative for  cough, chest tightness, shortness of breath and wheezing.   Cardiovascular:  Negative for chest pain and palpitations.  Gastrointestinal:  Negative for abdominal pain, constipation, diarrhea, nausea and vomiting.  Genitourinary:  Negative for dysuria, frequency and urgency.  Musculoskeletal:  Negative for myalgias.  Neurological:  Negative for dizziness, weakness and headaches.  Psychiatric/Behavioral:  Negative for confusion.   All other systems reviewed and are negative.   Physical Exam Triage Vital Signs ED Triage Vitals  Enc Vitals Group     BP 02/19/21 1102 (!) 112/57     Pulse Rate 02/19/21 1102 (!) 53     Resp 02/19/21 1102 18     Temp 02/19/21 1102 98.5 F (36.9 C)     Temp Source 02/19/21 1102 Oral     SpO2 02/19/21 1102 100 %     Weight --      Height --      Head Circumference --      Peak Flow --      Pain Score 02/19/21 1104 9      Pain Loc --      Pain Edu? --      Excl. in [place]? --    No data found.  Updated Vital Signs BP (!) 112/57 (BP Location: Left Arm)   Pulse (!) 53   Temp 98.5 F (36.9 C) (Oral)   Resp 18   SpO2 100%   Visual Acuity Right Eye Distance:   Left Eye Distance:   Bilateral Distance:    Right Eye Near:   Left Eye Near:    Bilateral Near:     Physical Exam Vitals reviewed.  Constitutional:      General: She is not in acute distress.    Appearance: Normal appearance. She is not ill-appearing.  HENT:     Head: Normocephalic and atraumatic.     Right Ear: Tympanic membrane, ear canal and external ear normal. No tenderness. No middle ear effusion. There is no impacted cerumen. Tympanic membrane is not perforated, erythematous, retracted or bulging.     Left Ear: Tympanic membrane, ear canal and external ear normal. No tenderness.  No middle ear effusion. There is no impacted cerumen. Tympanic membrane is not perforated, erythematous, retracted or bulging.     Nose: Nose normal. No congestion.     Mouth/Throat:     Mouth: Mucous membranes are moist.     Pharynx: Uvula midline. Posterior oropharyngeal erythema present. No oropharyngeal exudate.     Tonsils: 1+ on the right. 1+ on the left.     Comments: Few small aphthous ulcers anterior tongue. On exam, uvula is midline, she is tolerating her secretions without difficulty, there is no trismus, no drooling, she has normal phonation  Eyes:     Extraocular Movements: Extraocular movements intact.     Pupils: Pupils are equal, round, and reactive to light.  Cardiovascular:     Rate and Rhythm: Normal rate and regular rhythm.     Heart sounds: Normal heart sounds.  Pulmonary:     Effort: Pulmonary effort is normal.     Breath sounds: Normal breath sounds. No decreased breath sounds, wheezing, rhonchi or rales.  Abdominal:     Palpations: Abdomen is soft.     Tenderness: There is no abdominal tenderness. There is no guarding or  rebound.  Neurological:     General: No focal deficit present.     Mental Status: She is alert and oriented to person, place, and time.  Psychiatric:  Mood and Affect: Mood normal.        Behavior: Behavior normal.        Thought Content: Thought content normal.        Judgment: Judgment normal.     UC Treatments / Results  Labs (all labs ordered are listed, but only abnormal results are displayed) Labs Reviewed  CULTURE, GROUP A STREP [facility])  POCT RAPID STREP A, ED / UC  CYTOLOGY, (ORAL, ANAL, URETHRAL) ANCILLARY ONLY    EKG   Radiology No results found.  Procedures Procedures (including critical care time)  Medications Ordered in UC Medications - No data to display  Initial Impression / Assessment and Plan / UC Course  I have reviewed the triage vital signs and the nursing notes.  Pertinent labs & imaging results that were available during my care of the patient were reviewed by me and considered in my medical decision making (see chart for details).     This patient is a very pleasant 30 y.o. year old female presenting with continued pharyngitis x2 weeks.  Was last seen at our urgent care 2 weeks ago and tested negative for strep and covid at that time.  Rapid strep negative.  Culture sent. Patient denies new sexual partners, and had a negative STI screen of vagina 2 weeks ago.  Has not had a throat swab sent; will check an oral cytology for gonorrhea, chlamydia, trichomonas. Safe sex precautions.  Patient does have few canker sores and geographic tongue; Magic mouthwash sent.  ED return precautions discussed. Patient verbalizes understanding and agreement.   Coding this visit a level 4 for review of past notes and labs, order and interpretation of labs today, and prescription drug management.  Final Clinical Impressions(s) / UC Diagnoses   Final diagnoses:  Acute pharyngitis, unspecified etiology     Discharge Instructions      -Magic mouthwash up  to 3 times daily for mouth pain.  This will help with your throat/tongue discomfort and canker sores. -We are screening your throat for bacterial infection, this will come back in about 2 days.  We will call you if anything is positive. -For additional relief- Take Tylenol 1000 mg 3 times daily, and ibuprofen 800 mg 3 times daily with food.  You can take these together, or alternate every 3-4 hours.      ED Prescriptions     Medication Sig Dispense Auth. Provider   magic mouthwash (lidocaine, diphenhydrAMINE, alum & mag hydroxide) suspension Swish and spit 5 mLs 3 (three) times daily as needed for mouth pain. 360 mL Hazel Sams, PA-C      PDMP not reviewed this encounter.     Hazel Sams, PA-C 02/19/21 1219

## 2021-02-19 NOTE — ED Triage Notes (Signed)
Pt present sore throat pain with difficulty swallowing, symptom started two week ago. Pt was recently seen 007/02/2021 for same issue and symptom has gotten worst.

## 2021-02-21 LAB — CYTOLOGY, (ORAL, ANAL, URETHRAL) ANCILLARY ONLY
Chlamydia: NEGATIVE
Comment: NEGATIVE
Comment: NEGATIVE
Comment: NORMAL
Neisseria Gonorrhea: NEGATIVE
Trichomonas: NEGATIVE

## 2021-02-22 LAB — CULTURE, GROUP A STREP (THRC)

## 2021-03-07 ENCOUNTER — Encounter (HOSPITAL_BASED_OUTPATIENT_CLINIC_OR_DEPARTMENT_OTHER): Payer: Self-pay

## 2021-03-07 ENCOUNTER — Emergency Department (HOSPITAL_BASED_OUTPATIENT_CLINIC_OR_DEPARTMENT_OTHER)
Admission: EM | Admit: 2021-03-07 | Discharge: 2021-03-07 | Disposition: A | Discharge status: Home / Self Care | Payer: Medicaid Other | Attending: Emergency Medicine | Admitting: Emergency Medicine

## 2021-03-07 ENCOUNTER — Other Ambulatory Visit: Payer: Self-pay

## 2021-03-07 DIAGNOSIS — Z87891 Personal history of nicotine dependence: Secondary | ICD-10-CM | POA: Diagnosis not present

## 2021-03-07 DIAGNOSIS — R222 Localized swelling, mass and lump, trunk: Secondary | ICD-10-CM

## 2021-03-07 DIAGNOSIS — J029 Acute pharyngitis, unspecified: Secondary | ICD-10-CM | POA: Diagnosis present

## 2021-03-07 DIAGNOSIS — R591 Generalized enlarged lymph nodes: Secondary | ICD-10-CM | POA: Diagnosis not present

## 2021-03-07 LAB — COMPREHENSIVE METABOLIC PANEL
ALT: 28 U/L (ref 0–44)
AST: 22 U/L (ref 15–41)
Albumin: 4.1 g/dL (ref 3.5–5.0)
Alkaline Phosphatase: 48 U/L (ref 38–126)
Anion gap: 8 (ref 5–15)
BUN: 13 mg/dL (ref 6–20)
CO2: 26 mmol/L (ref 22–32)
Calcium: 9.4 mg/dL (ref 8.9–10.3)
Chloride: 103 mmol/L (ref 98–111)
Creatinine, Ser: 0.55 mg/dL (ref 0.44–1.00)
GFR, Estimated: >60 mL/min (ref >60)
Glucose, Bld: 97 mg/dL (ref 70–99)
Potassium: 4.1 mmol/L (ref 3.5–5.1)
Sodium: 137 mmol/L (ref 135–145)
Total Bilirubin: 0.5 mg/dL (ref 0.3–1.2)
Total Protein: 7.9 g/dL (ref 6.5–8.1)

## 2021-03-07 LAB — CBC WITH DIFFERENTIAL/PLATELET
Abs Immature Granulocytes: 0.02 10*3/uL (ref 0.00–0.07)
Basophils Absolute: 0 10*3/uL (ref 0.0–0.1)
Basophils Relative: 0 %
Eosinophils Absolute: 0.1 10*3/uL (ref 0.0–0.5)
Eosinophils Relative: 1 %
HCT: 40.1 % (ref 36.0–46.0)
Hemoglobin: 13.7 g/dL (ref 12.0–15.0)
Immature Granulocytes: 0 %
Lymphocytes Relative: 18 %
Lymphs Abs: 1.6 10*3/uL (ref 0.7–4.0)
MCH: 30.2 pg (ref 26.0–34.0)
MCHC: 34.2 g/dL (ref 30.0–36.0)
MCV: 88.5 fL (ref 80.0–100.0)
Monocytes Absolute: 0.5 10*3/uL (ref 0.1–1.0)
Monocytes Relative: 6 %
Neutro Abs: 6.6 10*3/uL (ref 1.7–7.7)
Neutrophils Relative %: 75 %
Platelets: 252 10*3/uL (ref 150–400)
RBC: 4.53 MIL/uL (ref 3.87–5.11)
RDW: 12.6 % (ref 11.5–15.5)
WBC: 8.8 10*3/uL (ref 4.0–10.5)
nRBC: 0 % (ref 0.0–0.2)

## 2021-03-07 LAB — HIV ANTIBODY (ROUTINE TESTING W REFLEX): HIV Screen 4th Generation wRfx: NONREACTIVE

## 2021-03-07 MED ORDER — AMOXICILLIN-POT CLAVULANATE 875-125 MG PO TABS: 1.0000 | ORAL_TABLET | Freq: Two times a day (BID) | ORAL | 0 refills | Status: DC

## 2021-03-07 NOTE — Discharge Instructions (Addendum)
It is very important to follow-up with a primary care physician to help better assess your lymph nodes.  You may need a biopsy if we cannot otherwise find a cause.

## 2021-03-07 NOTE — ED Provider Notes (Signed)
[facility] EMERGENCY DEPARTMENT Provider Note   CSN: [id number] Arrival date & time: 03/07/21  [id number]     History Chief Complaint  Patient presents with   Sore Throat    Courtney Strong is a 30 y.o. female.  HPI 30 year old female presents with sore throat and raspy voice.  Ongoing for 4+ weeks.  She also feels like she is having swollen lymph nodes to both sides of her neck, both armpits and groins.  No weight loss.  No fevers during this time.  Previously had a little bit of a cough but not really anymore.  However her throat is very sore and unless she takes the lidocaine given by urgent care on 7/30 she has a hard time swallowing due to pain.  She feels like her tongue is swollen.  She also has noticed a "lump" to her left breast/midline of her chest for about a week.  It looks like a bruise but she does not remember the injury.  Past Medical History:  Diagnosis Date   Abnormal Pap smear 2007   ASC-US    Bacterial vaginosis    Chlamydia    x3 , treated    Hx of trichomoniasis 2015   Migraines    UTI (lower urinary tract infection)     There are no problems to display for this patient.   Past Surgical History:  Procedure Laterality Date   CESAREAN SECTION       OB History     Gravida  5   Para  3   Term  3   Preterm      AB  2   Living  3      SAB  2   IAB  0   Ectopic      Multiple  0   Live Births  3           Family History  Problem Relation Age of Onset   Other Neg Hx    Hearing loss Neg Hx     Social History   Tobacco Use   Smoking status: Former    Packs/day: 0.25    Years: 10.00    Pack years: 2.50    Types: Cigarettes   Smokeless tobacco: Never  Vaping Use   Vaping Use: Every day  Substance Use Topics   Alcohol use: Not Currently   Drug use: Not Currently    Home Medications Prior to Admission medications   Medication Sig Start Date End Date Taking? Authorizing Provider  amoxicillin-clavulanate  (AUGMENTIN) 875-125 MG tablet Take 1 tablet by mouth 2 (two) times daily. 03/07/21  Yes Sherwood Gambler, MD  HYDROcodone-acetaminophen (NORCO/VICODIN) 5-325 MG tablet Take 1-2 tablets by mouth every 6 (six) hours as needed. 09/22/20   Robinson, [place] N, PA-C  levonorgestrel-ethinyl estradiol (NORDETTE) 0.15-30 MG-MCG tablet Take 1 tablet by mouth daily.    [provider]  magic mouthwash (lidocaine, diphenhydrAMINE, alum & mag hydroxide) suspension Swish and spit 5 mLs 3 (three) times daily as needed for mouth pain. 02/19/21   Hazel Sams, PA-C  medroxyPROGESTERone (DEPO-PROVERA) 150 MG/ML injection Inject 1 mL (150 mg total) into the muscle once for 1 dose. 08/26/18 08/26/18  Truett Mainland, DO  promethazine (PHENERGAN) 25 MG tablet Take 1 tablet (25 mg total) by mouth every 6 (six) hours as needed for nausea or vomiting. 09/22/20   Robinson, [place] N, PA-C    Allergies    Patient has no known allergies.  Review  of Systems   Review of Systems  Constitutional:  Negative for fever and unexpected weight change.  HENT:  Positive for sore throat and voice change.   Respiratory:  Negative for cough and shortness of breath.   Hematological:  Positive for adenopathy.  All other systems reviewed and are negative.  Physical Exam Updated Vital Signs BP (!) 110/59 (BP Location: Left Arm)   Pulse (!) 50   Temp 98.6 F (37 C)   Resp 18   Ht '5\' 4"'$  (1.626 m)   Wt 57.2 kg   LMP 03/02/2021   SpO2 100%   BMI 21.63 kg/m   Physical Exam Vitals and nursing note reviewed. Exam conducted with a chaperone present.  Constitutional:      General: She is not in acute distress.    Appearance: She is well-developed. She is not ill-appearing or diaphoretic.  HENT:     Head: Normocephalic and atraumatic.     Right Ear: External ear normal.     Left Ear: External ear normal.     Nose: Nose normal.     Mouth/Throat:     Mouth: No oral lesions.     Pharynx: No pharyngeal swelling, oropharyngeal  exudate, posterior oropharyngeal erythema or uvula swelling.     Tonsils: No tonsillar exudate or tonsillar abscesses.  Eyes:     General:        Right eye: No discharge.        Left eye: No discharge.  Neck:     Comments: There is perhaps some lymphadenopathy in the neck Cardiovascular:     Rate and Rhythm: Normal rate and regular rhythm.     Heart sounds: Normal heart sounds.  Pulmonary:     Effort: Pulmonary effort is normal.     Breath sounds: Normal breath sounds.  Chest:    Abdominal:     Palpations: Abdomen is soft.     Tenderness: There is no abdominal tenderness.  Lymphadenopathy:     Comments: Perhaps mild axillary lymphadenopathy but not definitively  Skin:    General: Skin is warm and dry.  Neurological:     Mental Status: She is alert.  Psychiatric:        Mood and Affect: Mood is not anxious.    ED Results / Procedures / Treatments   Labs (all labs ordered are listed, but only abnormal results are displayed) Labs Reviewed  COMPREHENSIVE METABOLIC PANEL  CBC WITH DIFFERENTIAL/PLATELET  HIV ANTIBODY (ROUTINE TESTING W REFLEX)    EKG None  Radiology No results found.  Procedures Procedures   Medications Ordered in ED Medications - No data to display  ED Course  I have reviewed the triage vital signs and the nursing notes.  Pertinent labs & imaging results that were available during my care of the patient were reviewed by me and considered in my medical decision making (see chart for details).    MDM Rules/Calculators/A&P                           Given patient's report of lymphadenopathy, which is hard to feel but perhaps in a small amount, labs were obtained.  We will also check HIV.  Labs including her WBC are normal.  At this point, if this persist she may need biopsy.  Unclear why she is having the raspy voice and throat discomfort for months.  I do not think an emergent CT is needed as I have a low  concern for airway edema or deep space  infection.  There is no floor of the mouth swelling.  We will refer her to an ENT for this and discussed she needs a follow-up with a PCP, which she states she is working on through her insurance.  We will also refer to the breast center though this area of her concern seems more like a bruise from the color.  Will place on antibiotics if this is lymphadenopathy to see if it changes the course.  Otherwise she appears stable for discharge home with return precautions. Final Clinical Impression(s) / ED Diagnoses Final diagnoses:  Chest mass  Lymphadenopathy    Rx / DC Orders ED Discharge Orders          Ordered    US BREAST LTD UNI LEFT INC AXILLA        03/07/21 0952    Ambulatory referral to Breast Clinic        03/07/21 0952    amoxicillin-clavulanate (AUGMENTIN) 875-125 MG tablet  2 times daily        03/07/21 [id number]             Sherwood Gambler, MD 03/07/21 1015

## 2021-03-07 NOTE — ED Triage Notes (Signed)
Pt arrives ambulatory to ED with c/o pain and swelling to throat since 7/30 when she was seen at [facility] for a sore throat. Pt reports that she also has swollen lymph nodes in her chest, armpits, and groin. Pt voice is raspy states this is also new for her. Pt also states she recently was taken off of her BC pills for a allergic reaction.

## 2021-03-22 ENCOUNTER — Other Ambulatory Visit: Payer: Self-pay | Admitting: General Surgery

## 2021-03-22 DIAGNOSIS — N63 Unspecified lump in unspecified breast: Secondary | ICD-10-CM

## 2021-03-25 ENCOUNTER — Other Ambulatory Visit: Payer: Self-pay

## 2021-03-25 ENCOUNTER — Other Ambulatory Visit: Payer: Medicaid Other

## 2021-03-25 ENCOUNTER — Ambulatory Visit
Admission: RE | Admit: 2021-03-25 | Discharge: 2021-03-25 | Disposition: A | Discharge status: Home / Self Care | Payer: Medicaid Other | Source: Ambulatory Visit | Attending: General Surgery | Admitting: General Surgery

## 2021-03-25 DIAGNOSIS — N63 Unspecified lump in unspecified breast: Secondary | ICD-10-CM

## 2021-05-19 ENCOUNTER — Inpatient Hospital Stay (HOSPITAL_COMMUNITY)
Admission: AD | Admit: 2021-05-19 | Discharge: 2021-05-19 | Disposition: A | Discharge status: Home / Self Care | Payer: Medicaid Other | Attending: Obstetrics & Gynecology | Admitting: Obstetrics & Gynecology

## 2021-05-19 ENCOUNTER — Other Ambulatory Visit: Payer: Self-pay

## 2021-05-19 DIAGNOSIS — N912 Amenorrhea, unspecified: Secondary | ICD-10-CM | POA: Diagnosis present

## 2021-05-19 NOTE — MAU Note (Addendum)
Presents for pregnancy confirmation letter to receive approval for Medicaid.  Denies pain or VB.

## 2021-05-19 NOTE — MAU Provider Note (Signed)
None     S Ms. Courtney Strong is a 30 y.o. [phone] patient who presents to MAU today with complaint of pregnancy and desire for confirmation paperwork.   States she is a patient of Dr. Glenna Durand and was told, by DSS, that she needs to come to the hospital to get a confirmation before making an appt with her doctor.  Patient reported to nurse she had no pregnancy related complaints.  O BP 119/60 (BP Location: Right Arm)   Pulse 79   Temp 98 F (36.7 C) (Oral)   Resp 18   Ht 5\' 2"  (1.575 m)   Wt 64.7 kg   LMP 03/21/2021   SpO2 98%   BMI 26.08 kg/m  Physical Exam Constitutional:      Appearance: Normal appearance.  HENT:     Head: Normocephalic and atraumatic.  Musculoskeletal:        General: Normal range of motion.     Cervical back: Normal range of motion.  Neurological:     Mental Status: She is alert and oriented to person, place, and time.  Psychiatric:        Mood and Affect: Affect is angry.        Behavior: Behavior is agitated.        Thought Content: Thought content normal.    A Medical screening exam complete  P Patient informed that pregnancy confirmation can not be provided today. Provider informed patient that she can reach out to her office to schedule her for a pregnancy confirmation appt today or later this week and a NOB appt. Secure chat sent to S. Malloy, NT Patient appears agitated and states that it is 1130 and she has to go.  Further reports that she will just go to her office. [facility] office notified of patient anticipated arrival, via secure chat.   Gavin Pound, CNM 05/19/2021 11:55 AM

## 2021-06-13 ENCOUNTER — Other Ambulatory Visit: Payer: Self-pay

## 2021-06-13 MED ORDER — METRONIDAZOLE 500 MG PO TABS: 500.0000 mg | ORAL_TABLET | Freq: Two times a day (BID) | ORAL | 0 refills | Status: DC

## 2021-06-23 ENCOUNTER — Encounter: Payer: Medicaid Other | Admitting: Family Medicine

## 2021-06-28 ENCOUNTER — Other Ambulatory Visit (HOSPITAL_COMMUNITY)
Admission: RE | Admit: 2021-06-28 | Discharge: 2021-06-28 | Disposition: A | Discharge status: Home / Self Care | Payer: Medicaid Other | Source: Ambulatory Visit | Attending: Advanced Practice Midwife | Admitting: Advanced Practice Midwife

## 2021-06-28 ENCOUNTER — Encounter: Payer: Self-pay | Admitting: Advanced Practice Midwife

## 2021-06-28 ENCOUNTER — Other Ambulatory Visit: Payer: Self-pay

## 2021-06-28 ENCOUNTER — Ambulatory Visit (INDEPENDENT_AMBULATORY_CARE_PROVIDER_SITE_OTHER): Payer: Medicaid Other | Admitting: Advanced Practice Midwife

## 2021-06-28 ENCOUNTER — Encounter: Payer: Self-pay | Admitting: General Practice

## 2021-06-28 VITALS — BP 113/66 | HR 78 | Wt 149.0 lb

## 2021-06-28 DIAGNOSIS — Z98891 History of uterine scar from previous surgery: Secondary | ICD-10-CM | POA: Diagnosis present

## 2021-06-28 DIAGNOSIS — O34219 Maternal care for unspecified type scar from previous cesarean delivery: Secondary | ICD-10-CM

## 2021-06-28 DIAGNOSIS — O09892 Supervision of other high risk pregnancies, second trimester: Secondary | ICD-10-CM

## 2021-06-28 DIAGNOSIS — Z3A14 14 weeks gestation of pregnancy: Secondary | ICD-10-CM

## 2021-06-28 DIAGNOSIS — O0993 Supervision of high risk pregnancy, unspecified, third trimester: Secondary | ICD-10-CM | POA: Insufficient documentation

## 2021-06-28 DIAGNOSIS — O09899 Supervision of other high risk pregnancies, unspecified trimester: Secondary | ICD-10-CM | POA: Insufficient documentation

## 2021-06-28 NOTE — Progress Notes (Signed)
INITIAL OBSTETRICAL VISIT Patient name: Courtney Strong MRN [id number]  Date of birth: Dec 05, 1990 Chief Complaint:   Initial Prenatal Visit  History of Present Illness:   Courtney Strong is a 30 y.o. [id number] Caucasian female at [redacted]w[redacted]d by  with an Estimated Date of Delivery: 12/26/21 being seen today for her initial obstetrical visit.   Her obstetrical history is significant for C/S in 20210 with 2 subsequent VBACs.  Last  SVD in 2019 without problems (induced for 2 vessel cord) .   Today she reports no complaints.   Depression screen [facility] 2/9 06/28/2021  Decreased Interest 0  Down, Depressed, Hopeless 0  PHQ - 2 Score 0  Altered sleeping 0  Tired, decreased energy 0  Change in appetite 0  Feeling bad or failure about yourself  0  Trouble concentrating 0  Moving slowly or fidgety/restless 0  Suicidal thoughts 0  PHQ-9 Score 0    Patient's last menstrual period was 03/21/2021. Last pap 2022. Will request records Review of Systems:   Pertinent items are noted in HPI Denies cramping/contractions, leakage of fluid, vaginal bleeding, abnormal vaginal discharge w/ itching/odor/irritation, headaches, visual changes, shortness of breath, chest pain, abdominal pain, severe nausea/vomiting, or problems with urination or bowel movements unless otherwise stated above.  Pertinent History Reviewed:  Reviewed past medical,surgical, social, obstetrical and family history.  Reviewed problem list, medications and allergies. OB History  Gravida Para Term Preterm AB Living  6 3 3   2 3   SAB IAB Ectopic Multiple Live Births  2 0   0 3    # Outcome Date GA Lbr Len/2nd Weight Sex Delivery Anes PTL Lv  6 Current           5 Term 07/08/18 [redacted]w[redacted]d 03:46 / 00:15 8 lb 1.4 oz (3.668 kg) F VBAC EPI  LIV  4 Term 01/19/13 [redacted]w[redacted]d 09:36 / 00:32 7 lb 11.5 oz (3.5 kg) M VBAC EPI  LIV     Birth Comments: No problems at birth  62 SAB 12/2011             Birth Comments: [facility], no complications  2 SAB 02/6760           1 Term 07/09/09 [redacted]w[redacted]d  6 lb 6 oz (2.892 kg) F CS-LTranv  N LIV     Birth Comments: [place]lig[place], Delivered WHOG, UTi and yeast x1   Physical Assessment:   Vitals:   06/28/21 0823  BP: 113/66  Pulse: 78  Weight: 149 lb (67.6 kg)  Body mass index is 27.25 kg/m.       Physical Examination:  General appearance - well appearing, and in no distress  Mental status - alert, oriented to person, place, and time  Psych:  She has a normal mood and affect  Skin - warm and dry, normal color, no suspicious lesions noted  Chest - effort normal, all lung fields clear to auscultation bilaterally  Heart - normal rate and regular rhythm  Abdomen - soft, nontender  Extremities:  No swelling or varicosities noted  Pelvic - VULVA: normal appearing vulva with no masses, tenderness or lesions  VAGINA: normal appearing vagina with normal color and discharge, no lesions  CERVIX: normal appearing cervix without discharge or lesions, no CMT  Thin prep pap is not done    Assessment & Plan:  1) Low-Risk Pregnancy [id number] at [redacted]w[redacted]d with an Estimated Date of Delivery: 12/26/21   2) Initial OB visit  3) Will request pap records  Wants PP BTL  Meds: No orders of the defined types were placed in this encounter.   Initial labs obtained Continue prenatal vitamins Reviewed n/v relief measures and warning s/s to report Reviewed recommended weight gain based on pre-gravid BMI Encouraged well-balanced diet Genetic & carrier screening discussed: requests Panorama and AFP, requests Panorama, AFP, and Horizon  Ultrasound discussed; fetal survey: requested CCNC completed> form faxed if has or is planning to apply for medicaid The nature of [place] for [employer] with multiple MDs and other Advanced Practice Providers was explained to patient; also emphasized that fellows, residents, and students are part of our team.   Indications for ASA therapy (per uptodate) One of the following: H/O  preeclampsia, especially early onset/adverse outcome No Multifetal gestation No CHTN No T1DM or T2DM No Chronic kidney disease No Autoimmune disease (antiphospholipid syndrome, systemic lupus erythematosus) No  OR Two or more of the following: Nulliparity No Obesity (BMI>30 kg/m2) No Family h/o preeclampsia in mother or sister No Age ?35 years No Sociodemographic characteristics (African American race, low socioeconomic level) No Personal risk factors (eg, previous pregnancy w/ LBW or SGA, previous adverse pregnancy outcome [eg, stillbirth], interval >10 years between pregnancies) No  Indications for early A1C (per uptodate) BMI >=25 (>=23 in Asian women) AND one of the following GDM in a previous pregnancy No Previous A1C?5.7, impaired glucose tolerance, or impaired fasting glucose on previous testing No First-degree relative with diabetes No High-risk race/ethnicity (eg, African American, Latino, Native American, Asian American, Pacific Islander) No History of cardiovascular disease No HTN or on therapy for hypertension No HDL cholesterol level <35 mg/dL (0.90 mmol/L) and/or a triglyceride level >250 mg/dL (2.82 mmol/L) No PCOS No Physical inactivity No Other clinical condition associated with insulin resistance (eg, severe obesity, acanthosis nigricans) No Previous birth of an infant weighing ?4000 g No Previous stillbirth of unknown cause No >= 40yo No  Follow-up: No follow-ups on file.   Orders Placed This Encounter  Procedures   Culture, OB Urine   [place] MFM OB DETAIL +14 WK   CBC/D/Plt+RPR+Rh+ABO+RubIgG...   Genetic Screening    Hansel Feinstein CNM, [facility] 06/28/2021 12:05 PM

## 2021-06-29 LAB — GC/CHLAMYDIA PROBE AMP (~~LOC~~) NOT AT ARMC
Chlamydia: NEGATIVE
Comment: NEGATIVE
Comment: NORMAL
Neisseria Gonorrhea: NEGATIVE

## 2021-06-30 ENCOUNTER — Other Ambulatory Visit: Payer: Self-pay | Admitting: Advanced Practice Midwife

## 2021-06-30 ENCOUNTER — Encounter: Payer: Self-pay | Admitting: Advanced Practice Midwife

## 2021-06-30 DIAGNOSIS — A539 Syphilis, unspecified: Secondary | ICD-10-CM | POA: Insufficient documentation

## 2021-06-30 DIAGNOSIS — O361990 Maternal care for other isoimmunization, unspecified trimester, not applicable or unspecified: Secondary | ICD-10-CM | POA: Insufficient documentation

## 2021-06-30 LAB — CBC/D/PLT+RPR+RH+ABO+RUBIGG...
Basophils Absolute: 0 10*3/uL (ref 0.0–0.2)
Basos: 0 %
EOS (ABSOLUTE): 0.2 10*3/uL (ref 0.0–0.4)
Eos: 2 %
HCV Ab: <0.1 s/co ratio (ref 0.0–0.9)
HIV Screen 4th Generation wRfx: NONREACTIVE
Hematocrit: 35.9 % (ref 34.0–46.6)
Hemoglobin: 12.3 g/dL (ref 11.1–15.9)
Hepatitis B Surface Ag: NEGATIVE
Immature Grans (Abs): 0 10*3/uL (ref 0.0–0.1)
Immature Granulocytes: 0 %
Lymphocytes Absolute: 1.9 10*3/uL (ref 0.7–3.1)
Lymphs: 24 %
MCH: 30.6 pg (ref 26.6–33.0)
MCHC: 34.3 g/dL (ref 31.5–35.7)
MCV: 89 fL (ref 79–97)
Monocytes Absolute: 0.6 10*3/uL (ref 0.1–0.9)
Monocytes: 8 %
Neutrophils Absolute: 5.3 10*3/uL (ref 1.4–7.0)
Neutrophils: 66 %
Platelets: 211 10*3/uL (ref 150–450)
RBC: 4.02 x10E6/uL (ref 3.77–5.28)
RDW: 12.8 % (ref 11.7–15.4)
RPR Ser Ql: REACTIVE — AB
Rh Factor: POSITIVE
Rubella Antibodies, IGG: <0.9 index — ABNORMAL LOW (ref >0.99)
WBC: 8 10*3/uL (ref 3.4–10.8)

## 2021-06-30 LAB — HCV INTERPRETATION

## 2021-06-30 LAB — RPR, QUANT+TP ABS (REFLEX)
Rapid Plasma Reagin, Quant: 1:16 {titer} — ABNORMAL HIGH
T Pallidum Abs: REACTIVE — AB

## 2021-06-30 LAB — AB SCR+ANTIBODY ID
Antibody Screen: POSITIVE — AB
Coombs Titer #1: 4

## 2021-06-30 MED ORDER — PENICILLIN G BENZATHINE 1200000 UNIT/2ML IM SUSY: 2.4000 10*6.[IU] | PREFILLED_SYRINGE | INTRAMUSCULAR | Status: AC

## 2021-07-02 LAB — URINE CULTURE, OB REFLEX

## 2021-07-02 LAB — CULTURE, OB URINE

## 2021-07-03 ENCOUNTER — Other Ambulatory Visit: Payer: Self-pay

## 2021-07-03 ENCOUNTER — Encounter (HOSPITAL_COMMUNITY): Payer: Self-pay

## 2021-07-03 ENCOUNTER — Ambulatory Visit (HOSPITAL_COMMUNITY)
Admission: EM | Admit: 2021-07-03 | Discharge: 2021-07-03 | Disposition: A | Discharge status: Home / Self Care | Payer: Medicaid Other | Attending: Emergency Medicine | Admitting: Emergency Medicine

## 2021-07-03 DIAGNOSIS — O98111 Syphilis complicating pregnancy, first trimester: Secondary | ICD-10-CM

## 2021-07-03 DIAGNOSIS — Z3A00 Weeks of gestation of pregnancy not specified: Secondary | ICD-10-CM

## 2021-07-03 MED ORDER — PENICILLIN G BENZATHINE 1200000 UNIT/2ML IM SUSY
2.4000 10*6.[IU] | PREFILLED_SYRINGE | Freq: Once | INTRAMUSCULAR | Status: AC
  Administered 2021-07-03: 2.4 10*6.[IU] via INTRAMUSCULAR

## 2021-07-03 MED ORDER — PENICILLIN G BENZATHINE 1200000 UNIT/2ML IM SUSY
PREFILLED_SYRINGE | INTRAMUSCULAR | Status: AC
  Filled 2021-07-03: qty 4

## 2021-07-03 NOTE — ED Triage Notes (Signed)
Pt presents with throat pain and swelling for past few weeks.

## 2021-07-03 NOTE — Discharge Instructions (Signed)
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  If you have had strep throat, I do not believe your symptoms would be life-threatening as long as they are.  I also think it is worthwhile to note that it is likely that your obstetrician did not evaluate your throat and receiving your prenatal last week.  You were treated with a penicillin injection as your obstetrician ordered, I have sent a message to your obstetrician to let them know that you were here today and that you received the injection.  They should reach out to you early next week to discuss next steps if there are any.  Some guidelines state that your baby should be tested for congenital syphilis but I do not seeing any definitive guidelines on whether this is something you will need to have done.    I have also provided you with the CDC guidelines about Syphilis treatment during pregnancy, the risks of treatment and other considerations.    Thank you for visiting urgent care today, I'm glad you decided to wait.  Take good care.

## 2021-07-03 NOTE — ED Provider Notes (Signed)
[place]    CSN: [id number] Arrival date & time: 07/03/21  1451    HISTORY   Chief Complaint  Patient presents with   Sore Throat   HPI Courtney Strong is a 30 y.o. female. Patient complains of pain and swelling in the throat for the past 2 weeks.  Pt advised CMA that she is pregnant, due 12/2021.  EMR reviewed by me.  Patient was seen by OB last week and STD screening was performed, patient's RPR antibiotic was positive.  Patient's will be ordered penicillin 2.4 to be given, I do not see that they have mentioned in the contact with the patient with the results as of yet.  Patient denies fever, chills, nausea, vomiting, diarrhea.  The history is provided by the patient.  Past Medical History:  Diagnosis Date   Abnormal Pap smear 2007   ASC-US    Bacterial vaginosis    Chlamydia    x3 , treated    Hx of trichomoniasis 2015   Migraines    UTI (lower urinary tract infection)    Patient Active Problem List   Diagnosis Date Noted   Maternal atypical antibody complicating pregnancy 94/80/1655   Syphilis 06/30/2021   Supervision of other high risk pregnancy, antepartum, unspecified trimester 06/28/2021   Previous cesarean delivery affecting pregnancy 11/20/2012   Past Surgical History:  Procedure Laterality Date   CESAREAN SECTION     OB History     Gravida  6   Para  3   Term  3   Preterm      AB  2   Living  3      SAB  2   IAB  0   Ectopic      Multiple  0   Live Births  3          Home Medications    Prior to Admission medications   Medication Sig Start Date End Date Taking? Authorizing Provider  Prenatal Vit-Fe Fumarate-FA (PRENATAL VITAMINS PO) Take by mouth.    [provider]    Family History Family History  Problem Relation Age of Onset   Other Neg Hx    Hearing loss Neg Hx    Social History Social History   Tobacco Use   Smoking status: Former    Packs/day: 0.25    Years: 10.00    Pack years:  2.50    Types: Cigarettes   Smokeless tobacco: Never  Vaping Use   Vaping Use: Every day  Substance Use Topics   Alcohol use: Not Currently   Drug use: Not Currently   Allergies   Patient has no known allergies.  Review of Systems Review of Systems Pertinent findings noted in history of present illness.   Physical Exam Triage Vital Signs ED Triage Vitals  Enc Vitals Group     BP 05/20/21 0827 (!) 147/82     Pulse Rate 05/20/21 0827 72     Resp 05/20/21 0827 18     Temp 05/20/21 0827 98.3 F (36.8 C)     Temp Source 05/20/21 0827 Oral     SpO2 05/20/21 0827 98 %     Weight --      Height --      Head Circumference --      Peak Flow --      Pain Score 05/20/21 0826 5     Pain Loc --      Pain Edu? --  Excl. in GC? --   No data found.  Updated Vital Signs BP 104/63 (BP Location: Left Arm)   Pulse 77   Temp 98.2 F (36.8 C) (Oral)   Resp 18   LMP 03/21/2021   SpO2 97%   Physical Exam Vitals and nursing note reviewed.  Constitutional:      General: She is not in acute distress.    Appearance: Normal appearance. She is not ill-appearing.  HENT:     Head: Normocephalic and atraumatic.     Mouth/Throat:     Pharynx: Posterior oropharyngeal erythema present.     Tonsils: Tonsillar exudate present. 3+ on the right. 3+ on the left.  Eyes:     General: Lids are normal.        Right eye: No discharge.        Left eye: No discharge.     Extraocular Movements: Extraocular movements intact.     Conjunctiva/sclera: Conjunctivae normal.     Right eye: Right conjunctiva is not injected.     Left eye: Left conjunctiva is not injected.  Neck:     Trachea: Trachea and phonation normal.  Cardiovascular:     Rate and Rhythm: Normal rate and regular rhythm.     Pulses: Normal pulses.     Heart sounds: Normal heart sounds. No murmur heard.   No friction rub. No gallop.  Pulmonary:     Effort: Pulmonary effort is normal. No accessory muscle usage, prolonged expiration  or respiratory distress.     Breath sounds: Normal breath sounds. No stridor, decreased air movement or transmitted upper airway sounds. No decreased breath sounds, wheezing, rhonchi or rales.  Chest:     Chest wall: No tenderness.  Musculoskeletal:        General: Normal range of motion.     Cervical back: Normal range of motion and neck supple. Normal range of motion.  Lymphadenopathy:     Cervical: Cervical adenopathy present.     Right cervical: Posterior cervical adenopathy present.     Left cervical: Posterior cervical adenopathy present.  Skin:    General: Skin is warm and dry.     Findings: No erythema or rash.  Neurological:     General: No focal deficit present.     Mental Status: She is alert and oriented to person, place, and time.  Psychiatric:        Mood and Affect: Mood normal.        Behavior: Behavior normal.    Visual Acuity Right Eye Distance:   Left Eye Distance:   Bilateral Distance:    Right Eye Near:   Left Eye Near:    Bilateral Near:     UC Couse / Diagnostics / Procedures:    EKG  Radiology No results found.  Procedures Procedures (including critical care time)  UC Diagnoses / Final Clinical Impressions(s)   I have reviewed the triage vital signs and the nursing notes.  Pertinent labs & imaging results that were available during my care of the patient were reviewed by me and considered in my medical decision making (see chart for details).    Final diagnoses:  Syphilis affecting pregnancy in first trimester  Patient advised of results of STD screening, patient states she has not been contacted by [place] provider as of yet.  Patient asks to be seen penicillin as ordered by her baby on June 30, 2021, penicillin given her request.  Patient states to her provider to let them know she  was here today.   ED Prescriptions   None    PDMP not reviewed this encounter.  Pending results:  Labs Reviewed - No data to display  Medications Ordered  in UC: Medications  penicillin g benzathine (BICILLIN LA) 1200000 UNIT/2ML injection 2.4 Million Units (2.4 Million Units Intramuscular Given 07/03/21 1712)    Disposition Upon Discharge:  Condition: stable for discharge home Home: take medications as prescribed; routine discharge instructions as discussed; follow up as advised.  Patient presented with an acute illness with associated systemic symptoms and significant discomfort requiring urgent management. In my opinion, this is a condition that a prudent lay person (someone who possesses an average knowledge of health and medicine) may potentially expect to result in complications if not addressed urgently such as respiratory distress, impairment of bodily function or dysfunction of bodily organs.   Routine symptom specific, illness specific and/or disease specific instructions were discussed with the patient and/or caregiver at length.   As such, the patient has been evaluated and assessed, work-up was performed and treatment was provided in alignment with urgent care protocols and evidence based medicine.  Patient/parent/caregiver has been advised that the patient may require follow up for further testing and treatment if the symptoms continue in spite of treatment, as clinically indicated and appropriate.  If the patient was tested for COVID-19, Influenza and/or RSV, then the patient/parent/guardian was advised to isolate at home pending the results of his/her diagnostic coronavirus test and potentially longer if they're positive. I have also advised pt that if his/her COVID-19 test returns positive, it's recommended to self-isolate for at least 10 days after symptoms first appeared AND until fever-free for 24 hours without fever reducer AND other symptoms have improved or resolved. Discussed self-isolation recommendations as well as instructions for household member/close contacts as per the [facility] and [place] [facility], and also gave patient the [place]  packet with this information.  Patient/parent/caregiver has been advised to return to the [facility] [place] or PCP in 3-5 days if no better; to PCP or the Emergency Department if new signs and symptoms develop, or if the current signs or symptoms continue to change or worsen for further workup, evaluation and treatment as clinically indicated and appropriate  The patient will follow up with their current PCP if and as advised. If the patient does not currently have a PCP we will assist them in obtaining one.   The patient may need specialty follow up if the symptoms continue, in spite of conservative treatment and management, for further workup, evaluation, consultation and treatment as clinically indicated and appropriate.   Patient/parent/caregiver verbalized understanding and agreement of plan as discussed.  All questions were addressed during visit.  Please see discharge instructions below for further details of plan.  Discharge Instructions:   Discharge Instructions      Based on the appearance of your throat, believe that is where your primary syphilis infection is located.  If you have had strep throat, I do not believe your symptoms would be life-threatening as long as they are.  I also think it is worthwhile to note that it is likely that your obstetrician did not evaluate your throat and receiving your prenatal last week.  You were treated with a penicillin injection as your obstetrician ordered, I have sent a message to your obstetrician to let them know that you were here today and that you received the injection.  They should reach out to you early next week to discuss next steps if there are any.  Some guidelines state that your baby should be tested for congenital syphilis but I do not seeing any definitive guidelines on whether this is something you will need to have done.    I have also provided you with the CDC guidelines about Syphilis treatment during pregnancy, the risks of treatment and  other considerations.    Thank you for visiting urgent care today, I'm glad you decided to wait.  Take good care.          Lynden Oxford Scales, PA-C 07/03/21 1725

## 2021-07-04 ENCOUNTER — Telehealth: Payer: Self-pay

## 2021-07-04 NOTE — Telephone Encounter (Signed)
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  Thanks Lelan Pons

## 2021-07-04 NOTE — Telephone Encounter (Signed)
Left message for patient to return call to office.   Patient needs to be made aware of weekly injections for three weeks and treatment of GBS+ UTI Courtney Alu RN

## 2021-07-04 NOTE — Telephone Encounter (Signed)
Nurse from [facility] called to see if pt was notified of her positive Syphilis and treatment. Per the nurse, the pt will need to be treated once weekly x 3 weeks with 2.4 million Bicillin. Understanding was voiced. Aleeza [place] l Noheli Melder, CMA

## 2021-07-04 NOTE — Telephone Encounter (Signed)
Patient returned call to office. Patient made aware of RPR positive (syphillis). Patient states that she was seen yesterday at urgent care and they gave her the first dose of her Penicillin G. Patient made aware we will need to do this two more times and we can place her on the schedule for next week for next dose. Patient agrees. Patient also made aware that she had a UTI but will check with provider on treatment for this since she just received peniciilin injection yesterday. Kathrene Alu RN

## 2021-07-05 ENCOUNTER — Telehealth: Payer: Self-pay

## 2021-07-05 NOTE — Telephone Encounter (Signed)
Patient called stating that she has been in contact with someone from [facility] Tommi Rumps) who states that patient had negative RPR test last year and she would only need 1 dose of the Penicilling G. Patient made aware that the provider stated she needed three doses. I assured her I will route to Dr. Nehemiah Settle for review.  ALso patient had GBS+ urine culture and does she need a script for treatment of this?

## 2021-07-06 ENCOUNTER — Other Ambulatory Visit: Payer: Self-pay

## 2021-07-06 MED ORDER — TERCONAZOLE 0.4 % VA CREA: 1.0000 | TOPICAL_CREAM | Freq: Every day | VAGINAL | 0 refills | Status: AC

## 2021-07-06 NOTE — Telephone Encounter (Signed)
Did she have the RPR testing elsewhere? The last RPR that we have in our system was in 2019.  No, at 2000 colonies, she does not need to be treated for GBS.   Truett Mainland, DO

## 2021-07-06 NOTE — Telephone Encounter (Signed)
Spoke with the [facility] Tommi Rumps [phone] patient had a negative RPR on 07-20-2021.  So therefore per Dr. Nehemiah Settle patient has been treated with one dose of Penicillin G and that's all she should need.   Patient made aware and patient is to come sign records release for [facility] health department to get those results.   Kathrene Alu RN

## 2021-07-08 NOTE — Telephone Encounter (Signed)
Lab results received from [facility] showing negative RPR on 07/20/2020. As this is less than 1 year ago, this will be considered early syphilis and the patient only requires one dose of pencillin, which she has already received. Lab record to be scanned into patient's chart.  Truett Mainland, DO 07/08/2021 8:13 AM

## 2021-07-24 NOTE — L&D Delivery Note (Signed)
[facility]N [facility] Delivery Note  EDEE NIFONG is a 31 y.o. D3T7017 s/p SVD at [redacted]w[redacted]d She was admitted for IOL for FGR.   ROM: 3h 232mith clear fluid GBS Status: Negative/-- (05/10 0907) Maximum Maternal Temperature: afebrile  Labor Progress: Initial SVE: closed/thick/high. She then progressed to complete.   Delivery Date/Time:  Delivery: Called to room and patient was complete and pushing. Head delivered LOA. No nuchal cord present. Shoulder and body delivered in usual fashion. Infant with spontaneous cry, placed on mother's abdomen, dried and stimulated. Cord clamped x 2 after 1-minute delay, and cut by support person. Cord blood drawn. Placenta delivered spontaneously with gentle cord traction. Fundus firm with massage and Pitocin. Labia, perineum, vagina, and cervix inspected inspected with no lacerations.  Baby Weight: pending  Placenta: Sent to L&D Complications: None Lacerations: none EBL: 500 mL Analgesia: Epidural   Infant:  APGAR (1 MIN):  10 APGAR (5 MINS):  10 APGAR (10 MINS):     [place] WoHubbell/23/2023, 3:35 PM

## 2021-07-27 ENCOUNTER — Other Ambulatory Visit: Payer: Self-pay

## 2021-07-27 ENCOUNTER — Ambulatory Visit (INDEPENDENT_AMBULATORY_CARE_PROVIDER_SITE_OTHER): Payer: Medicaid Other | Admitting: Family Medicine

## 2021-07-27 ENCOUNTER — Encounter: Payer: Self-pay | Admitting: General Practice

## 2021-07-27 VITALS — BP 110/66 | HR 83 | Wt 157.0 lb

## 2021-07-27 DIAGNOSIS — Z98891 History of uterine scar from previous surgery: Secondary | ICD-10-CM

## 2021-07-27 DIAGNOSIS — O361990 Maternal care for other isoimmunization, unspecified trimester, not applicable or unspecified: Secondary | ICD-10-CM

## 2021-07-27 DIAGNOSIS — Z3A18 18 weeks gestation of pregnancy: Secondary | ICD-10-CM

## 2021-07-27 DIAGNOSIS — A539 Syphilis, unspecified: Secondary | ICD-10-CM

## 2021-07-27 DIAGNOSIS — O09899 Supervision of other high risk pregnancies, unspecified trimester: Secondary | ICD-10-CM

## 2021-07-27 NOTE — Progress Notes (Signed)
° °  PRENATAL VISIT NOTE  Subjective:  Courtney Strong is a 31 y.o. [id number] at 101w2d being seen today for ongoing prenatal care.  She is currently monitored for the following issues for this high-risk pregnancy and has Previous cesarean delivery affecting pregnancy; Supervision of other high risk pregnancy, antepartum, unspecified trimester; Maternal atypical antibody complicating pregnancy; and Syphilis on their problem list.  Patient reports no complaints.  Contractions: Not present. Vag. Bleeding: None.  Movement: Present. Denies leaking of fluid.   The following portions of the patient's history were reviewed and updated as appropriate: allergies, current medications, past family history, past medical history, past social history, past surgical history and problem list.   Objective:   Vitals:   07/27/21 0820 07/27/21 0826  BP: (!) 104/49 110/66  Pulse: 73 83  Weight: 157 lb (71.2 kg)     Fetal Status: Fetal Heart Rate (bpm): 150   Movement: Present     General:  Alert, oriented and cooperative. Patient is in no acute distress.  Skin: Skin is warm and dry. No rash noted.   Cardiovascular: Normal heart rate noted  Respiratory: Normal respiratory effort, no problems with respiration noted  Abdomen: Soft, gravid, appropriate for gestational age.  Pain/Pressure: Present     Pelvic: Cervical exam deferred        Extremities: Normal range of motion.  Edema: None  Mental Status: Normal mood and affect. Normal behavior. Normal judgment and thought content.   Assessment and Plan:  Pregnancy: [id number] at [redacted]w[redacted]d 1. Supervision of other high risk pregnancy, antepartum, unspecified trimester - AFP, Serum, Open Spina Bifida  2. [redacted] weeks gestation of pregnancy - AFP, Serum, Open Spina Bifida  3. Maternal atypical antibody affecting pregnancy, antepartum, single or unspecified fetus [place] antibody screen - Antibody screen  4. History of cesarean section Desires rpt c/s with BTL  5.  Syphilis S/p treatment. Check RPR and titers at 28 weeks.  Preterm labor symptoms and general obstetric precautions including but not limited to vaginal bleeding, contractions, leaking of fluid and fetal movement were reviewed in detail with the patient. Please refer to After Visit Summary for other counseling recommendations.   No follow-ups on file.  Future Appointments  Date Time Provider [place]  08/01/2021  7:30 AM [facility] NURSE [facility]  08/01/2021  7:45 AM [facility] US4 [facility]  08/24/2021  8:35 AM Truett Mainland, DO [facility] None  09/21/2021  8:15 AM Truett Mainland, DO [facility] None  10/05/2021  8:15 AM Truett Mainland, DO [facility] None  10/19/2021  8:15 AM Nehemiah Settle Tanna Savoy, DO [facility] None    Truett Mainland, DO

## 2021-07-29 LAB — AFP, SERUM, OPEN SPINA BIFIDA
AFP MoM: 0.63
AFP Value: 28.4 ng/mL
Gest. Age on Collection Date: 18.2 weeks
Maternal Age At EDD: 29.8 yr
OSBR Risk 1 IN: 10000
Test Results:: NEGATIVE
Weight: 157 [lb_av]

## 2021-08-01 ENCOUNTER — Ambulatory Visit: Payer: Medicaid Other

## 2021-08-01 ENCOUNTER — Ambulatory Visit: Payer: Medicaid Other | Admitting: *Deleted

## 2021-08-01 ENCOUNTER — Ambulatory Visit: Payer: Medicaid Other | Attending: Advanced Practice Midwife

## 2021-08-01 ENCOUNTER — Encounter: Payer: Self-pay | Admitting: *Deleted

## 2021-08-01 ENCOUNTER — Other Ambulatory Visit: Payer: Self-pay

## 2021-08-01 ENCOUNTER — Other Ambulatory Visit: Payer: Self-pay | Admitting: *Deleted

## 2021-08-01 VITALS — BP 121/70 | HR 80

## 2021-08-01 DIAGNOSIS — O09899 Supervision of other high risk pregnancies, unspecified trimester: Secondary | ICD-10-CM | POA: Diagnosis present

## 2021-08-01 DIAGNOSIS — Z98891 History of uterine scar from previous surgery: Secondary | ICD-10-CM | POA: Insufficient documentation

## 2021-08-01 DIAGNOSIS — O280 Abnormal hematological finding on antenatal screening of mother: Secondary | ICD-10-CM

## 2021-08-01 DIAGNOSIS — Z362 Encounter for other antenatal screening follow-up: Secondary | ICD-10-CM

## 2021-08-24 ENCOUNTER — Encounter: Payer: Medicaid Other | Admitting: Family Medicine

## 2021-08-29 ENCOUNTER — Encounter: Payer: Self-pay | Admitting: *Deleted

## 2021-08-29 ENCOUNTER — Ambulatory Visit: Payer: Medicaid Other | Admitting: *Deleted

## 2021-08-29 ENCOUNTER — Other Ambulatory Visit: Payer: Self-pay | Admitting: Obstetrics and Gynecology

## 2021-08-29 ENCOUNTER — Other Ambulatory Visit: Payer: Self-pay

## 2021-08-29 ENCOUNTER — Other Ambulatory Visit: Payer: Self-pay | Admitting: *Deleted

## 2021-08-29 ENCOUNTER — Ambulatory Visit: Payer: Medicaid Other | Attending: Obstetrics and Gynecology

## 2021-08-29 ENCOUNTER — Ambulatory Visit (HOSPITAL_BASED_OUTPATIENT_CLINIC_OR_DEPARTMENT_OTHER): Payer: Medicaid Other | Admitting: Obstetrics and Gynecology

## 2021-08-29 VITALS — BP 124/57 | HR 81

## 2021-08-29 DIAGNOSIS — O361121 Maternal care for Anti-A sensitization, second trimester, fetus 1: Secondary | ICD-10-CM

## 2021-08-29 DIAGNOSIS — O09899 Supervision of other high risk pregnancies, unspecified trimester: Secondary | ICD-10-CM

## 2021-08-29 DIAGNOSIS — O280 Abnormal hematological finding on antenatal screening of mother: Secondary | ICD-10-CM

## 2021-08-29 DIAGNOSIS — Z362 Encounter for other antenatal screening follow-up: Secondary | ICD-10-CM | POA: Diagnosis present

## 2021-08-29 DIAGNOSIS — Z3A23 23 weeks gestation of pregnancy: Secondary | ICD-10-CM | POA: Diagnosis present

## 2021-08-29 DIAGNOSIS — O34219 Maternal care for unspecified type scar from previous cesarean delivery: Secondary | ICD-10-CM | POA: Diagnosis present

## 2021-08-29 DIAGNOSIS — O360930 Maternal care for other rhesus isoimmunization, third trimester, not applicable or unspecified: Secondary | ICD-10-CM | POA: Diagnosis present

## 2021-08-29 DIAGNOSIS — O365920 Maternal care for other known or suspected poor fetal growth, second trimester, not applicable or unspecified: Secondary | ICD-10-CM | POA: Diagnosis present

## 2021-08-29 NOTE — Progress Notes (Signed)
Maternal-Fetal Medicine   Name: Courtney Strong DOB: [birth year] MRN: [id number] Referring Provider: Loma Boston, MD  I had the pleasure of seeing Ms. Eschbach [facility]. She is G5 P3013 at 23-weeks' gestation and returned for completion of fetal anatomy. Obstetrical history is significant for a term cesarean delivery followed by 2 term VBACs.  Her previous children weighed 6 pounds 6 ounces, 7 pounds 11 ounces and 8 pounds and 4 ounces (most recent).  Her pregnancies were not complicated by fetal growth restriction.  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  In her previous pregnancy 3 years ago, the titers were 1:8.  Patient completed treatment for latent syphilis in this pregnancy. Patient does not give history of fever or rashes in this pregnancy.  On cell-free fetal DNA screening, the risks of fetal aneuploidies are not increased.  Ultrasound Amniotic fluid is normal and good fetal activity seen.  The estimated fetal weight is at the 4th percentile.  Abdominal circumference measurement is at the 15th percentile and the femur length measurement is at the 3rd percentile.  Umbilical artery Dopplers show normal forward diastolic flow.  The middle cerebral arteries showed normal peak systolic velocity measurements (no evidence of fetal anemia).   Fetal growth restriction I discussed ultrasound findings consistent with fetal growth restriction.  It is difficult to differentiate a constitutionally small fetus from fetal growth restriction. I discussed the possible causes of fetal growth restriction including placental insufficiency (most common cause).  Other causes include chromosomal anomalies or fetal infections (rare). I explained that amniocentesis can determine fetal karyotype and some genetic conditions. Patient opted not to have amniocentesis. I discussed ultrasound protocol of monitoring fetal growth  restriction.  Timing of delivery: Small for gestational age fetuses have a higher risk of perinatal mortality.  I recommend delivery at 38 weeks' gestation if severe fetal growth restriction is not present.  Patient agreed with my recommendations.  Recommendations -Umbilical artery Doppler study in 2 weeks. -Fetal growth assessment, UA Doppler, and MCA Doppler studies in 3 weeks.  Thank you for consultation.  If you have any questions or concerns, please contact me the [facility].  Consultation including face-to-face (more than 50%) counseling 30 minutes.

## 2021-08-30 ENCOUNTER — Encounter: Payer: Self-pay | Admitting: Advanced Practice Midwife

## 2021-08-30 DIAGNOSIS — O365990 Maternal care for other known or suspected poor fetal growth, unspecified trimester, not applicable or unspecified: Secondary | ICD-10-CM | POA: Insufficient documentation

## 2021-09-12 ENCOUNTER — Ambulatory Visit: Payer: Medicaid Other | Attending: Obstetrics and Gynecology

## 2021-09-12 ENCOUNTER — Other Ambulatory Visit: Payer: Self-pay

## 2021-09-12 ENCOUNTER — Other Ambulatory Visit: Payer: Self-pay | Admitting: Obstetrics and Gynecology

## 2021-09-12 ENCOUNTER — Other Ambulatory Visit: Payer: Self-pay | Admitting: Obstetrics

## 2021-09-12 ENCOUNTER — Ambulatory Visit: Payer: Medicaid Other | Admitting: *Deleted

## 2021-09-12 VITALS — BP 118/67 | HR 97

## 2021-09-12 DIAGNOSIS — O34219 Maternal care for unspecified type scar from previous cesarean delivery: Secondary | ICD-10-CM | POA: Diagnosis not present

## 2021-09-12 DIAGNOSIS — O365920 Maternal care for other known or suspected poor fetal growth, second trimester, not applicable or unspecified: Secondary | ICD-10-CM

## 2021-09-12 DIAGNOSIS — O361121 Maternal care for Anti-A sensitization, second trimester, fetus 1: Secondary | ICD-10-CM | POA: Diagnosis present

## 2021-09-12 DIAGNOSIS — Z3A25 25 weeks gestation of pregnancy: Secondary | ICD-10-CM

## 2021-09-12 DIAGNOSIS — O365990 Maternal care for other known or suspected poor fetal growth, unspecified trimester, not applicable or unspecified: Secondary | ICD-10-CM

## 2021-09-12 DIAGNOSIS — O09899 Supervision of other high risk pregnancies, unspecified trimester: Secondary | ICD-10-CM | POA: Diagnosis present

## 2021-09-19 ENCOUNTER — Other Ambulatory Visit: Payer: Self-pay | Admitting: *Deleted

## 2021-09-19 ENCOUNTER — Ambulatory Visit: Payer: Medicaid Other | Attending: Obstetrics | Admitting: Obstetrics

## 2021-09-19 ENCOUNTER — Encounter: Payer: Self-pay | Admitting: *Deleted

## 2021-09-19 ENCOUNTER — Ambulatory Visit: Payer: Medicaid Other | Attending: Obstetrics and Gynecology

## 2021-09-19 ENCOUNTER — Other Ambulatory Visit: Payer: Self-pay

## 2021-09-19 ENCOUNTER — Ambulatory Visit: Payer: Medicaid Other | Admitting: *Deleted

## 2021-09-19 VITALS — BP 109/62 | HR 118

## 2021-09-19 DIAGNOSIS — Z3A26 26 weeks gestation of pregnancy: Secondary | ICD-10-CM

## 2021-09-19 DIAGNOSIS — O09899 Supervision of other high risk pregnancies, unspecified trimester: Secondary | ICD-10-CM | POA: Insufficient documentation

## 2021-09-19 DIAGNOSIS — O365990 Maternal care for other known or suspected poor fetal growth, unspecified trimester, not applicable or unspecified: Secondary | ICD-10-CM | POA: Diagnosis present

## 2021-09-19 DIAGNOSIS — O361130 Maternal care for Anti-A sensitization, third trimester, not applicable or unspecified: Secondary | ICD-10-CM

## 2021-09-19 DIAGNOSIS — O361120 Maternal care for Anti-A sensitization, second trimester, not applicable or unspecified: Secondary | ICD-10-CM | POA: Diagnosis not present

## 2021-09-19 DIAGNOSIS — O361121 Maternal care for Anti-A sensitization, second trimester, fetus 1: Secondary | ICD-10-CM | POA: Insufficient documentation

## 2021-09-19 DIAGNOSIS — O34219 Maternal care for unspecified type scar from previous cesarean delivery: Secondary | ICD-10-CM | POA: Diagnosis not present

## 2021-09-19 DIAGNOSIS — O365920 Maternal care for other known or suspected poor fetal growth, second trimester, not applicable or unspecified: Secondary | ICD-10-CM | POA: Diagnosis not present

## 2021-09-20 NOTE — Progress Notes (Signed)
MFM Note  Darcus Edds was seen for a follow up growth scan due to fetal growth restriction.  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 She denies any problems since her last exam and reports feeling vigorous fetal movements throughout the day.    On today's exam, the EFW measures at the 2nd percentile for her gestational age indicating severe fetal growth restriction.  The fetus grew about half a pound over the past 3 weeks.  There was normal amniotic fluid noted.    Fetal movements were noted throughout today's exam. Doppler studies of the umbilical arteries showed a normal S/D ratio of 2.82. There were no signs of absent or reversed end-diastolic flow.    The peak systolic velocity of the middle cerebral artery was less than 1.5 multiple of the median for her gestational age, indicating that her fetus is not anemic at this time.   There were no signs of fetal hydrops noted on today's exam. The patient was reassured by today's findings.   Due to fetal growth restriction, an NST and umbilical artery Doppler study was scheduled in 2 weeks.    The patient was advised that we will continue to follow her closely throughout her pregnancy.    Should IUGR continue to be noted later in her pregnancy and her fetal testing remains within normal limits, delivery will be recommended at around 37 weeks.    The patient stated that all of her questions were answered today.  A total of 20 minutes was spent counseling and coordinating the care for this patient.  Greater than 50% of the time was spent in direct face-to-face contact.

## 2021-09-21 ENCOUNTER — Encounter: Payer: Self-pay | Admitting: General Practice

## 2021-09-21 ENCOUNTER — Ambulatory Visit (INDEPENDENT_AMBULATORY_CARE_PROVIDER_SITE_OTHER): Payer: Medicaid Other | Admitting: Family Medicine

## 2021-09-21 ENCOUNTER — Other Ambulatory Visit: Payer: Self-pay

## 2021-09-21 VITALS — BP 131/63 | HR 88 | Wt 165.0 lb

## 2021-09-21 DIAGNOSIS — A539 Syphilis, unspecified: Secondary | ICD-10-CM

## 2021-09-21 DIAGNOSIS — O361990 Maternal care for other isoimmunization, unspecified trimester, not applicable or unspecified: Secondary | ICD-10-CM

## 2021-09-21 DIAGNOSIS — Z3A26 26 weeks gestation of pregnancy: Secondary | ICD-10-CM

## 2021-09-21 DIAGNOSIS — O09899 Supervision of other high risk pregnancies, unspecified trimester: Secondary | ICD-10-CM

## 2021-09-21 DIAGNOSIS — O34219 Maternal care for unspecified type scar from previous cesarean delivery: Secondary | ICD-10-CM

## 2021-09-21 DIAGNOSIS — O365990 Maternal care for other known or suspected poor fetal growth, unspecified trimester, not applicable or unspecified: Secondary | ICD-10-CM

## 2021-09-21 NOTE — Progress Notes (Signed)
? ?  PRENATAL VISIT NOTE ? ?Subjective:  ?Courtney Strong is a 31 y.o. [phone] at [redacted]w[redacted]d being seen today for ongoing prenatal care.  She is currently monitored for the following issues for this high-risk pregnancy and has Previous cesarean delivery affecting pregnancy; Supervision of other high risk pregnancy, antepartum, unspecified trimester; Maternal atypical antibody complicating pregnancy; Syphilis; and Fetal growth restriction antepartum on their problem list. ? ?Patient reports no complaints.  Contractions: Not present. Vag. Bleeding: None.  Movement: Present. Denies leaking of fluid.  ? ?The following portions of the patient's history were reviewed and updated as appropriate: allergies, current medications, past family history, past medical history, past social history, past surgical history and problem list.  ? ?Objective:  ? ?Vitals:  ? 09/21/21 0825 09/21/21 0829  ?BP: (!) 116/49 131/63  ?Pulse: 86 88  ?Weight: 165 lb (74.8 kg)   ? ? ?Fetal Status: Fetal Heart Rate (bpm): 144   Movement: Present    ? ?General:  Alert, oriented and cooperative. Patient is in no acute distress.  ?Skin: Skin is warm and dry. No rash noted.   ?Cardiovascular: Normal heart rate noted  ?Respiratory: Normal respiratory effort, no problems with respiration noted  ?Abdomen: Soft, gravid, appropriate for gestational age.  Pain/Pressure: Present     ?Pelvic: Cervical exam deferred        ?Extremities: Normal range of motion.  Edema: None  ?Mental Status: Normal mood and affect. Normal behavior. Normal judgment and thought content.  ? ?Assessment and Plan:  ?Pregnancy: [id number] at [redacted]w[redacted]d ?1. [redacted] weeks gestation of pregnancy ?FHT and FH normal ? ?2. Supervision of other high risk pregnancy, antepartum, unspecified trimester ? ?3. Fetal growth restriction antepartum ?2% tile. ?Dopplers normal ?Delivery at 37 weeks. ? ?4. Maternal atypical antibody affecting pregnancy, antepartum, single or unspecified fetus ?Antibody screen ? ?5.  Syphilis ?Antibody screen with 28 week labs ? ?6. Previous cesarean delivery affecting pregnancy ?Desires TOLAC at this point ? ?Preterm labor symptoms and general obstetric precautions including but not limited to vaginal bleeding, contractions, leaking of fluid and fetal movement were reviewed in detail with the patient. ?Please refer to After Visit Summary for other counseling recommendations.  ? ?No follow-ups on file. ? ?Future Appointments  ?Date Time Provider [place]  ?10/03/2021  2:30 PM [facility] NURSE [facility]  ?10/03/2021  2:45 PM [facility] US6 [facility]  ?10/03/2021  4:00 PM [facility] NST [facility]  ?10/05/2021  8:15 AM Nehemiah Settle Tanna Savoy, DO [facility] None  ?10/11/2021  2:00 PM [facility] NURSE [facility]  ?10/11/2021  2:15 PM [facility] US2 [facility]-M[facility]US [place]  ?10/11/2021  3:15 PM [facility] NST [facility]  ?10/17/2021  1:30 PM [facility] NURSE [facility]  ?10/17/2021  1:45 PM [facility] US6 [facility]US [facility]  ?10/17/2021  3:15 PM [facility] NST [facility]  ?10/19/2021  8:15 AM Nehemiah Settle Tanna Savoy, DO [facility] None  ?10/24/2021  1:30 PM [facility] NURSE [facility]  ?10/24/2021  1:45 PM [facility] US5 [facility]  ?10/24/2021  3:15 PM [facility] NST [facility]  ? ? ?Truett Mainland, DO ? ? ? ?

## 2021-09-25 ENCOUNTER — Other Ambulatory Visit: Payer: Self-pay

## 2021-09-25 ENCOUNTER — Encounter (HOSPITAL_COMMUNITY): Payer: Self-pay | Admitting: Obstetrics and Gynecology

## 2021-09-25 ENCOUNTER — Inpatient Hospital Stay (HOSPITAL_COMMUNITY)
Admission: AD | Admit: 2021-09-25 | Discharge: 2021-09-25 | Disposition: A | Discharge status: Home / Self Care | Payer: Medicaid Other | Attending: Obstetrics and Gynecology | Admitting: Obstetrics and Gynecology

## 2021-09-25 DIAGNOSIS — O34219 Maternal care for unspecified type scar from previous cesarean delivery: Secondary | ICD-10-CM | POA: Diagnosis not present

## 2021-09-25 DIAGNOSIS — O23592 Infection of other part of genital tract in pregnancy, second trimester: Secondary | ICD-10-CM | POA: Insufficient documentation

## 2021-09-25 DIAGNOSIS — N760 Acute vaginitis: Secondary | ICD-10-CM

## 2021-09-25 DIAGNOSIS — B9689 Other specified bacterial agents as the cause of diseases classified elsewhere: Secondary | ICD-10-CM | POA: Diagnosis not present

## 2021-09-25 DIAGNOSIS — Z3A26 26 weeks gestation of pregnancy: Secondary | ICD-10-CM

## 2021-09-25 LAB — URINALYSIS, ROUTINE W REFLEX MICROSCOPIC
Bilirubin Urine: NEGATIVE
Glucose, UA: NEGATIVE mg/dL
Hgb urine dipstick: NEGATIVE
Ketones, ur: NEGATIVE mg/dL
Nitrite: NEGATIVE
Protein, ur: NEGATIVE mg/dL
Specific Gravity, Urine: 1.018 (ref 1.005–1.030)
pH: 6 (ref 5.0–8.0)

## 2021-09-25 LAB — WET PREP, GENITAL
Sperm: NONE SEEN
Trich, Wet Prep: NONE SEEN
WBC, Wet Prep HPF POC: >=10 — AB (ref <10)
Yeast Wet Prep HPF POC: NONE SEEN

## 2021-09-25 MED ORDER — METRONIDAZOLE 0.75 % VA GEL: 1.0000 | Freq: Every day | VAGINAL | 1 refills | Status: DC

## 2021-09-25 NOTE — MAU Note (Signed)
..  Courtney Strong is a 31 y.o. at 32w6dhere in MAU reporting: white, thick sticky mucous discharge that was in clump in the pt underwear around 2030. Pt denies VB, DFM, LOF, and cramping or ctx. Last intercourse was two days ago.  ? ?Onset of complaint: 2030 ?Pain score: 0/10 ?Vitals:  ? 09/25/21 2046  ?BP: 125/63  ?Pulse: 87  ?Resp: 18  ?Temp: 98.2 ?F (36.8 ?C)  ?SpO2: 98%  ?   ?FHT:155 ?Lab orders placed from triage:  UA ? ?

## 2021-09-25 NOTE — MAU Provider Note (Signed)
?History  ?  ? ?CSN: [id number] ? ?Arrival date and time: 09/25/21 2035 ? ?None  ?  ?Chief Complaint  ?Patient presents with  ? Vaginal Discharge  ? ?HPI ?Courtney Strong is a 31 y.o. [id number] at 62w6dwho presents to MAU for vaginal discharge. Patient reports going to the bathroom approximately 15 minutes prior to arrival to MAU and noticed a ball of clear mucous in underwear. She denies leaking fluid, vaginal bleeding, itching/odor, or contractions. Endorses active fetal movement. She reports she is just concerned as this has never happened in her prior pregnancies and just wanted to make sure everything was okay.  ? ?[facility] [place] [facility] [place] [facility] Pregnancy has been significant for previous c-section, fetal growth restriction, and syphilis.  ? ?OB History   ? ? Gravida  ?5  ? Para  ?3  ? Term  ?3  ? Preterm  ?   ? AB  ?1  ? Living  ?3  ?  ? ? SAB  ?1  ? IAB  ?0  ? Ectopic  ?   ? Multiple  ?0  ? Live Births  ?3  ?   ?  ?  ? ? ?Past Medical History:  ?Diagnosis Date  ? Abnormal Pap smear 2007  ? ASC-US   ? Bacterial vaginosis   ? Chlamydia   ? x3 , treated   ? Hx of trichomoniasis 2015  ? Migraines   ? UTI (lower urinary tract infection)   ? ? ?Past Surgical History:  ?Procedure Laterality Date  ? CESAREAN SECTION    ? ? ?Family History  ?Problem Relation Age of Onset  ? Other Neg Hx   ? Hearing loss Neg Hx   ? ? ?Social History  ? ?Tobacco Use  ? Smoking status: Former  ?  Packs/day: 0.25  ?  Years: 10.00  ?  Pack years: 2.50  ?  Types: Cigarettes  ? Smokeless tobacco: Never  ?Vaping Use  ? Vaping Use: Former  ? Quit date: 04/22/2021  ? Substances: Nicotine, Flavoring  ?Substance Use Topics  ? Alcohol use: Not Currently  ? Drug use: Not Currently  ? ? ?Allergies: No Known Allergies ? ?Medications Prior to Admission  ?Medication Sig Dispense Refill Last Dose  ? Prenatal Vit-Fe Fumarate-FA (PRENATAL VITAMINS PO) Take by mouth.   09/25/2021  ? ? ?Review of Systems  ?Constitutional: Negative.    ?Respiratory: Negative.    ?Cardiovascular: Negative.   ?Gastrointestinal: Negative.   ?Genitourinary:  Positive for vaginal discharge. Negative for dysuria and vaginal bleeding.  ?Musculoskeletal: Negative.   ?Neurological: Negative.   ? ?Physical Exam  ? ?Blood pressure 125/63, pulse 87, temperature 98.2 ?F (36.8 ?C), temperature source Oral, resp. rate 18, height '5\' 4"'$  (1.626 m), weight 74.7 kg, last menstrual period 03/21/2021, SpO2 98 %, unknown if currently breastfeeding. ? ?Physical Exam ?Vitals and nursing note reviewed. Exam conducted with a chaperone present.  ?Constitutional:   ?   General: She is not in acute distress. ?Eyes:  ?   Extraocular Movements: Extraocular movements intact.  ?   Pupils: Pupils are equal, round, and reactive to light.  ?Cardiovascular:  ?   Rate and Rhythm: Normal rate.  ?Pulmonary:  ?   Effort: Pulmonary effort is normal.  ?Abdominal:  ?   Palpations: Abdomen is soft.  ?   Tenderness: There is no abdominal tenderness.  ?   Comments: gravid  ?Genitourinary: ?   Comments: NEFG, vaginal walls pink with rugae, no  bleeding, no pooling of amniotic fluid, scant creamy white discharge, cervix visually closed without lesions/masses ?Musculoskeletal:     ?   General: Normal range of motion.  ?   Cervical back: Normal range of motion.  ?Skin: ?   General: Skin is warm and dry.  ?Neurological:  ?   General: No focal deficit present.  ?   Mental Status: She is alert and oriented to person, place, and time.  ?Psychiatric:     ?   Mood and Affect: Mood normal.     ?   Behavior: Behavior normal.     ?   Thought Content: Thought content normal.     ?   Judgment: Judgment normal.  ? ?NST ?FHR: 150 bpm, moderate variability, +10x10 accels, no decels ?Toco: quiet ? ?MAU Course  ?Procedures ?NST ?UA ?Speculum exam, wet prep, GC/CT ? ?MDM ?NST reassuring for gestational age. UA unremarkable. Wet prep +clue cells. Speculum exam negative for pooling of amniotic fluid. No signs of ROM.  ? ?Assessment  and Plan  ?[redacted] weeks gestation of pregnancy ?Bacterial vaginosis ? ?- Discharge home in stable condition ?- Rx for metrogel sent to pharmacy ?- Strict return precautions reviewed. Return to MAU sooner or as needed for worsening symptoms ?- Keep OB appointment as scheduled ? ? ?Renee Harder, CNM ?09/25/2021, 10:01 PM  ?

## 2021-09-26 LAB — GC/CHLAMYDIA PROBE AMP (~~LOC~~) NOT AT ARMC
Chlamydia: NEGATIVE
Comment: NEGATIVE
Comment: NORMAL
Neisseria Gonorrhea: NEGATIVE

## 2021-10-03 ENCOUNTER — Other Ambulatory Visit: Payer: Self-pay

## 2021-10-03 ENCOUNTER — Ambulatory Visit: Payer: Medicaid Other | Attending: Obstetrics

## 2021-10-03 ENCOUNTER — Ambulatory Visit: Payer: Medicaid Other | Admitting: *Deleted

## 2021-10-03 ENCOUNTER — Encounter: Payer: Self-pay | Admitting: *Deleted

## 2021-10-03 ENCOUNTER — Ambulatory Visit (HOSPITAL_BASED_OUTPATIENT_CLINIC_OR_DEPARTMENT_OTHER): Payer: Medicaid Other | Admitting: *Deleted

## 2021-10-03 VITALS — BP 113/52 | HR 79

## 2021-10-03 DIAGNOSIS — O09899 Supervision of other high risk pregnancies, unspecified trimester: Secondary | ICD-10-CM

## 2021-10-03 DIAGNOSIS — O361130 Maternal care for Anti-A sensitization, third trimester, not applicable or unspecified: Secondary | ICD-10-CM | POA: Diagnosis not present

## 2021-10-03 DIAGNOSIS — Z3A28 28 weeks gestation of pregnancy: Secondary | ICD-10-CM

## 2021-10-03 DIAGNOSIS — O365930 Maternal care for other known or suspected poor fetal growth, third trimester, not applicable or unspecified: Secondary | ICD-10-CM

## 2021-10-03 DIAGNOSIS — O365990 Maternal care for other known or suspected poor fetal growth, unspecified trimester, not applicable or unspecified: Secondary | ICD-10-CM | POA: Insufficient documentation

## 2021-10-03 NOTE — Procedures (Signed)
Courtney Strong ?1991/07/24 ?[redacted]w[redacted]d? ?Fetus A Non-Stress Test Interpretation for 10/03/21 ? ?Indication: IUGR ? ?Fetal Heart Rate A ?Mode: External ?Baseline Rate (A): 145 bpm ?Variability: Moderate ?Accelerations: 15 x 15 ?Decelerations: None ?Multiple birth?: No ? ?Uterine Activity ?Mode: Palpation, Toco ?Contraction Frequency (min): None ?Resting Tone Palpated: Relaxed ? ?Interpretation (Fetal Testing) ?Nonstress Test Interpretation: Reactive ?Overall Impression: Reassuring for gestational age ?Comments: Dr. SDonalee Citrinreviewed tracing. ? ? ?

## 2021-10-04 ENCOUNTER — Other Ambulatory Visit: Payer: Self-pay | Admitting: *Deleted

## 2021-10-04 DIAGNOSIS — O365990 Maternal care for other known or suspected poor fetal growth, unspecified trimester, not applicable or unspecified: Secondary | ICD-10-CM

## 2021-10-05 ENCOUNTER — Encounter: Payer: Self-pay | Admitting: General Practice

## 2021-10-05 ENCOUNTER — Ambulatory Visit (INDEPENDENT_AMBULATORY_CARE_PROVIDER_SITE_OTHER): Payer: Medicaid Other | Admitting: Family Medicine

## 2021-10-05 VITALS — BP 110/69 | HR 86 | Wt 166.0 lb

## 2021-10-05 DIAGNOSIS — O09899 Supervision of other high risk pregnancies, unspecified trimester: Secondary | ICD-10-CM

## 2021-10-05 DIAGNOSIS — O34219 Maternal care for unspecified type scar from previous cesarean delivery: Secondary | ICD-10-CM

## 2021-10-05 DIAGNOSIS — O365990 Maternal care for other known or suspected poor fetal growth, unspecified trimester, not applicable or unspecified: Secondary | ICD-10-CM

## 2021-10-05 DIAGNOSIS — Z3A28 28 weeks gestation of pregnancy: Secondary | ICD-10-CM

## 2021-10-05 DIAGNOSIS — O361990 Maternal care for other isoimmunization, unspecified trimester, not applicable or unspecified: Secondary | ICD-10-CM

## 2021-10-05 NOTE — Progress Notes (Signed)
? ?  PRENATAL VISIT NOTE ? ?Subjective:  ?Courtney Strong is a 31 y.o. [phone] at 62w2dbeing seen today for ongoing prenatal care.  She is currently monitored for the following issues for this high-risk pregnancy and has Previous cesarean delivery affecting pregnancy; Supervision of other high risk pregnancy, antepartum, unspecified trimester; Maternal atypical antibody complicating pregnancy; Syphilis; and Fetal growth restriction antepartum on their problem list. ? ?Patient reports nausea and vomiting.  Contractions: Not present. Vag. Bleeding: None.  Movement: Present. Denies leaking of fluid.  ? ?The following portions of the patient's history were reviewed and updated as appropriate: allergies, current medications, past family history, past medical history, past social history, past surgical history and problem list.  ? ?Objective:  ? ?Vitals:  ? 10/05/21 0837  ?BP: 110/69  ?Pulse: 86  ?Weight: 166 lb (75.3 kg)  ? ? ?Fetal Status: Fetal Heart Rate (bpm): 142 Fundal Height: 26 cm Movement: Present    ? ?General:  Alert, oriented and cooperative. Patient is in no acute distress.  ?Skin: Skin is warm and dry. No rash noted.   ?Cardiovascular: Normal heart rate noted  ?Respiratory: Normal respiratory effort, no problems with respiration noted  ?Abdomen: Soft, gravid, appropriate for gestational age.  Pain/Pressure: Absent     ?Pelvic: Cervical exam deferred        ?Extremities: Normal range of motion.  Edema: None  ?Mental Status: Normal mood and affect. Normal behavior. Normal judgment and thought content.  ? ?Assessment and Plan:  ?Pregnancy: [id number] 217w2d1. [redacted] weeks gestation of pregnancy ?- CBC ?- Glucose Tolerance, 2 Hours w/1 Hour ?- HIV Antibody (routine testing w rflx) ?- RPR ? ?2. Supervision of other high risk pregnancy, antepartum, unspecified trimester ?FHT normal ?- Antibody screen ? ?3. Previous cesarean delivery affecting pregnancy ?Desires repeat VBAC (2 prior successful) ? ?4. Fetal  growth restriction antepartum ?Continue dopplers, growth [place]?Last EFW 1.8% on 2/28. ?Dopplers normal ?Anticipate delivery at 37 weeks ? ?5. Maternal atypical antibody affecting pregnancy, antepartum, single or unspecified fetus ?- Antibody screen ? ?Preterm labor symptoms and general obstetric precautions including but not limited to vaginal bleeding, contractions, leaking of fluid and fetal movement were reviewed in detail with the patient. ?Please refer to After Visit Summary for other counseling recommendations.  ? ?No follow-ups on file. ? ?Future Appointments  ?Date Time Provider [place]?10/11/2021  2:00 PM [facility] NURSE [facility]  ?10/11/2021  2:15 PM [facility] US2 [facility] [place]?10/11/2021  3:15 PM [facility] NST [facility]  ?10/17/2021  1:30 PM [facility] NURSE [facility]  ?10/17/2021  1:45 PM [facility] US6 [facility]  ?10/17/2021  3:15 PM [facility] NST [facility]  ?10/19/2021  8:15 AM StNehemiah SettleaTanna SavoyDO [facility] None  ?10/24/2021  1:30 PM [facility] NURSE [facility]  ?10/24/2021  1:45 PM [facility] US5 [facility]  ?10/24/2021  3:15 PM [facility] NST [facility]  ?11/01/2021  2:15 PM [facility] NURSE [facility]  ?11/01/2021  2:30 PM [facility] [place] [place]?11/01/2021  3:15 PM [facility] NST [facility]  ?11/04/2021  9:35 AM Anyanwu, UgSallyanne HaversMD [facility] None  ?11/16/2021  9:15 AM StTruett MainlandDO [facility] None  ?11/30/2021  8:15 AM StNehemiah SettleaTanna SavoyDO [facility] None  ?12/07/2021  8:15 AM StNehemiah SettleaTanna SavoyDO [facility] None  ?12/14/2021  8:15 AM StNehemiah SettleaTanna SavoyDO [facility] None  ?12/21/2021  8:15 AM DuRadene GunningMD [facility] None  ? ? ?JaTruett MainlandDO ?

## 2021-10-07 LAB — CBC
Hematocrit: 35.6 % (ref 34.0–46.6)
Hemoglobin: 11.8 g/dL (ref 11.1–15.9)
MCH: 30.6 pg (ref 26.6–33.0)
MCHC: 33.1 g/dL (ref 31.5–35.7)
MCV: 93 fL (ref 79–97)
Platelets: 204 10*3/uL (ref 150–450)
RBC: 3.85 x10E6/uL (ref 3.77–5.28)
RDW: 12.4 % (ref 11.7–15.4)
WBC: 8.4 10*3/uL (ref 3.4–10.8)

## 2021-10-07 LAB — HIV ANTIBODY (ROUTINE TESTING W REFLEX): HIV Screen 4th Generation wRfx: NONREACTIVE

## 2021-10-07 LAB — ANTIBODY SCREEN

## 2021-10-07 LAB — GLUCOSE TOLERANCE, 2 HOURS W/ 1HR
Glucose, 1 hour: 135 mg/dL (ref 70–179)
Glucose, 2 hour: 94 mg/dL (ref 70–152)
Glucose, Fasting: 85 mg/dL (ref 70–91)

## 2021-10-07 LAB — RPR, QUANT+TP ABS (REFLEX)
Rapid Plasma Reagin, Quant: 1:8 {titer} — ABNORMAL HIGH
T Pallidum Abs: REACTIVE — AB

## 2021-10-07 LAB — AB SCR+ANTIBODY ID
Antibody Screen: POSITIVE — AB
Coombs Titer #1: 4

## 2021-10-07 LAB — RPR: RPR Ser Ql: REACTIVE — AB

## 2021-10-11 ENCOUNTER — Other Ambulatory Visit: Payer: Self-pay

## 2021-10-11 ENCOUNTER — Ambulatory Visit: Payer: Medicaid Other | Attending: Obstetrics

## 2021-10-11 ENCOUNTER — Ambulatory Visit (HOSPITAL_BASED_OUTPATIENT_CLINIC_OR_DEPARTMENT_OTHER): Payer: Medicaid Other | Admitting: *Deleted

## 2021-10-11 ENCOUNTER — Ambulatory Visit: Payer: Medicaid Other | Admitting: *Deleted

## 2021-10-11 ENCOUNTER — Encounter: Payer: Self-pay | Admitting: Family Medicine

## 2021-10-11 ENCOUNTER — Other Ambulatory Visit: Payer: Self-pay | Admitting: Obstetrics

## 2021-10-11 VITALS — BP 123/61 | HR 89

## 2021-10-11 DIAGNOSIS — O365930 Maternal care for other known or suspected poor fetal growth, third trimester, not applicable or unspecified: Secondary | ICD-10-CM | POA: Insufficient documentation

## 2021-10-11 DIAGNOSIS — Z3A29 29 weeks gestation of pregnancy: Secondary | ICD-10-CM

## 2021-10-11 DIAGNOSIS — O361130 Maternal care for Anti-A sensitization, third trimester, not applicable or unspecified: Secondary | ICD-10-CM

## 2021-10-11 DIAGNOSIS — O365990 Maternal care for other known or suspected poor fetal growth, unspecified trimester, not applicable or unspecified: Secondary | ICD-10-CM

## 2021-10-11 DIAGNOSIS — O34219 Maternal care for unspecified type scar from previous cesarean delivery: Secondary | ICD-10-CM | POA: Diagnosis not present

## 2021-10-11 DIAGNOSIS — O09899 Supervision of other high risk pregnancies, unspecified trimester: Secondary | ICD-10-CM | POA: Diagnosis present

## 2021-10-11 DIAGNOSIS — O361930 Maternal care for other isoimmunization, third trimester, not applicable or unspecified: Secondary | ICD-10-CM | POA: Diagnosis not present

## 2021-10-11 NOTE — Procedures (Signed)
Courtney Strong ?[birth year] ?[redacted]w[redacted]d? ?Fetus A Non-Stress Test Interpretation for 10/11/21 ? ?Indication: IUGR ? ?Fetal Heart Rate A ?Mode: External ?Baseline Rate (A): 150 bpm ?Variability: Moderate ?Accelerations: 15 x 15 ?Decelerations: None ?Multiple birth?: No ? ?Uterine Activity ?Mode: Palpation, Toco ?Contraction Frequency (min): None ? ?Interpretation (Fetal Testing) ?Nonstress Test Interpretation: Reactive ?Comments: Dr. FAnnamaria Bootsreviewed tracing. ? ? ?

## 2021-10-17 ENCOUNTER — Ambulatory Visit: Payer: Medicaid Other | Admitting: *Deleted

## 2021-10-17 ENCOUNTER — Ambulatory Visit: Payer: Medicaid Other | Attending: Obstetrics

## 2021-10-17 ENCOUNTER — Other Ambulatory Visit: Payer: Self-pay

## 2021-10-17 ENCOUNTER — Ambulatory Visit (HOSPITAL_BASED_OUTPATIENT_CLINIC_OR_DEPARTMENT_OTHER): Payer: Medicaid Other | Admitting: *Deleted

## 2021-10-17 ENCOUNTER — Encounter: Payer: Self-pay | Admitting: *Deleted

## 2021-10-17 VITALS — BP 96/58 | HR 118

## 2021-10-17 DIAGNOSIS — O361930 Maternal care for other isoimmunization, third trimester, not applicable or unspecified: Secondary | ICD-10-CM

## 2021-10-17 DIAGNOSIS — O365990 Maternal care for other known or suspected poor fetal growth, unspecified trimester, not applicable or unspecified: Secondary | ICD-10-CM | POA: Diagnosis not present

## 2021-10-17 DIAGNOSIS — O361130 Maternal care for Anti-A sensitization, third trimester, not applicable or unspecified: Secondary | ICD-10-CM | POA: Diagnosis not present

## 2021-10-17 DIAGNOSIS — O09899 Supervision of other high risk pregnancies, unspecified trimester: Secondary | ICD-10-CM | POA: Insufficient documentation

## 2021-10-17 DIAGNOSIS — O34219 Maternal care for unspecified type scar from previous cesarean delivery: Secondary | ICD-10-CM | POA: Diagnosis not present

## 2021-10-17 DIAGNOSIS — Z3A30 30 weeks gestation of pregnancy: Secondary | ICD-10-CM

## 2021-10-17 DIAGNOSIS — O365930 Maternal care for other known or suspected poor fetal growth, third trimester, not applicable or unspecified: Secondary | ICD-10-CM

## 2021-10-17 NOTE — Procedures (Signed)
Courtney Strong ?[birth year] ?[redacted]w[redacted]d? ?Fetus A Non-Stress Test Interpretation for 10/17/21 ? ?Indication: IUGR ? ?Fetal Heart Rate A ?Mode: External ?Baseline Rate (A): 145 bpm ?Variability: Moderate ?Accelerations: 15 x 15 ?Decelerations: Variable ?Multiple birth?: No ? ?Uterine Activity ?Mode: Palpation, Toco ?Contraction Frequency (min): none ?Resting Tone Palpated: Relaxed ? ?Interpretation (Fetal Testing) ?Nonstress Test Interpretation: Reactive ?Overall Impression: Reassuring for gestational age ?Comments: Dr. FAnnamaria Bootsreviewed tracing ? ? ?

## 2021-10-19 ENCOUNTER — Encounter: Payer: Medicaid Other | Admitting: Family Medicine

## 2021-10-20 ENCOUNTER — Ambulatory Visit (INDEPENDENT_AMBULATORY_CARE_PROVIDER_SITE_OTHER): Payer: Medicaid Other | Admitting: Family Medicine

## 2021-10-20 VITALS — BP 127/59 | HR 91 | Wt 168.0 lb

## 2021-10-20 DIAGNOSIS — O34219 Maternal care for unspecified type scar from previous cesarean delivery: Secondary | ICD-10-CM

## 2021-10-20 DIAGNOSIS — O365990 Maternal care for other known or suspected poor fetal growth, unspecified trimester, not applicable or unspecified: Secondary | ICD-10-CM

## 2021-10-20 DIAGNOSIS — O361930 Maternal care for other isoimmunization, third trimester, not applicable or unspecified: Secondary | ICD-10-CM

## 2021-10-20 DIAGNOSIS — Z3A30 30 weeks gestation of pregnancy: Secondary | ICD-10-CM

## 2021-10-20 DIAGNOSIS — O09899 Supervision of other high risk pregnancies, unspecified trimester: Secondary | ICD-10-CM

## 2021-10-20 DIAGNOSIS — A539 Syphilis, unspecified: Secondary | ICD-10-CM

## 2021-10-20 NOTE — Progress Notes (Signed)
? ?  PRENATAL VISIT NOTE ? ?Subjective:  ?Courtney Strong is a 31 y.o. [phone] at 74w3dbeing seen today for ongoing prenatal care.  She is currently monitored for the following issues for this high-risk pregnancy and has Previous cesarean delivery affecting pregnancy; Supervision of other high risk pregnancy, antepartum, unspecified trimester; Maternal atypical antibody complicating pregnancy; Syphilis; and Fetal growth restriction antepartum on their problem list. ? ?Patient reports no complaints.  Contractions: Not present. Vag. Bleeding: None.  Movement: Present. Denies leaking of fluid.  ? ?The following portions of the patient's history were reviewed and updated as appropriate: allergies, current medications, past family history, past medical history, past social history, past surgical history and problem list.  ? ?Objective:  ? ?Vitals:  ? 10/20/21 1536  ?BP: (!) 127/59  ?Pulse: 91  ?Weight: 168 lb (76.2 kg)  ? ? ?Fetal Status: Fetal Heart Rate (bpm): 150 Fundal Height: 28 cm Movement: Present    ? ?General:  Alert, oriented and cooperative. Patient is in no acute distress.  ?Skin: Skin is warm and dry. No rash noted.   ?Cardiovascular: Normal heart rate noted  ?Respiratory: Normal respiratory effort, no problems with respiration noted  ?Abdomen: Soft, gravid, appropriate for gestational age.  Pain/Pressure: Absent     ?Pelvic: Cervical exam deferred        ?Extremities: Normal range of motion.  Edema: None  ?Mental Status: Normal mood and affect. Normal behavior. Normal judgment and thought content.  ? ?Assessment and Plan:  ?Pregnancy: [id number] 341w3d1. [redacted] weeks gestation of pregnancy ?FHT and Fh normal ? ?2. Supervision of other high risk pregnancy, antepartum, unspecified trimester ?PP BTL papers signed ? ?3. Previous cesarean delivery affecting pregnancy ?Desires TOLAC - had successful VBAC x2. ?Hasn't signed form yet ? ?4. Maternal atypical antibody affecting pregnancy in third trimester, single  or unspecified fetus ?Antibody screen next appt ? ?5. Fetal growth restriction antepartum ?Growth ok. ?EFW 8% ?Delivery at 37 weeks per MFM ?BPP normal ?Cord dopplers normal ? ?6. Syphilis ?Titer stable ? ?Preterm labor symptoms and general obstetric precautions including but not limited to vaginal bleeding, contractions, leaking of fluid and fetal movement were reviewed in detail with the patient. ?Please refer to After Visit Summary for other counseling recommendations.  ? ?No follow-ups on file. ? ?Future Appointments  ?Date Time Provider [place]?10/24/2021  1:30 PM [facility] NURSE [facility]  ?10/24/2021  1:45 PM [facility] US5 [facility]  ?10/24/2021  3:15 PM [facility] NST [facility]  ?11/01/2021  2:15 PM [facility] NURSE [facility]  ?11/01/2021  2:30 PM [facility] US2 [facility] [place]?11/01/2021  3:15 PM [facility] NST [facility]  ?11/04/2021  9:35 AM Anyanwu, UgSallyanne HaversMD [facility] None  ?11/16/2021  9:15 AM StTruett MainlandDO [facility] None  ?11/30/2021  8:15 AM StNehemiah SettleaTanna SavoyDO [facility] None  ?12/07/2021  8:15 AM StNehemiah SettleaTanna SavoyDO [facility] None  ?12/14/2021  8:15 AM StNehemiah SettleaTanna SavoyDO [facility] None  ?12/21/2021  8:15 AM DuRadene GunningMD [facility] None  ? ? ?JaTruett MainlandDO ? ? ? ?

## 2021-10-24 ENCOUNTER — Ambulatory Visit: Payer: Medicaid Other | Attending: Obstetrics

## 2021-10-24 ENCOUNTER — Ambulatory Visit: Payer: Medicaid Other | Admitting: *Deleted

## 2021-10-24 ENCOUNTER — Other Ambulatory Visit: Payer: Self-pay | Admitting: Obstetrics

## 2021-10-24 VITALS — BP 117/64 | HR 95

## 2021-10-24 DIAGNOSIS — O365990 Maternal care for other known or suspected poor fetal growth, unspecified trimester, not applicable or unspecified: Secondary | ICD-10-CM

## 2021-10-24 DIAGNOSIS — O09899 Supervision of other high risk pregnancies, unspecified trimester: Secondary | ICD-10-CM

## 2021-10-24 DIAGNOSIS — O361930 Maternal care for other isoimmunization, third trimester, not applicable or unspecified: Secondary | ICD-10-CM

## 2021-10-24 DIAGNOSIS — O34219 Maternal care for unspecified type scar from previous cesarean delivery: Secondary | ICD-10-CM | POA: Diagnosis not present

## 2021-10-24 DIAGNOSIS — O361130 Maternal care for Anti-A sensitization, third trimester, not applicable or unspecified: Secondary | ICD-10-CM | POA: Diagnosis not present

## 2021-10-24 DIAGNOSIS — O365930 Maternal care for other known or suspected poor fetal growth, third trimester, not applicable or unspecified: Secondary | ICD-10-CM

## 2021-10-24 DIAGNOSIS — Z3A31 31 weeks gestation of pregnancy: Secondary | ICD-10-CM | POA: Diagnosis not present

## 2021-10-24 NOTE — Procedures (Signed)
Courtney Strong ?[birth year] ?[redacted]w[redacted]d? ?Fetus A Non-Stress Test Interpretation for 10/24/21 ? ?Indication: IUGR ? ?Fetal Heart Rate A ?Mode: External ?Baseline Rate (A): 145 bpm ?Variability: Moderate ?Accelerations: 15 x 15 ?Decelerations: None ?Multiple birth?: No ? ?Uterine Activity ?Mode: Palpation, Toco ?Contraction Frequency (min): None ? ?Interpretation (Fetal Testing) ?Nonstress Test Interpretation: Reactive ?Comments: Dr. FAnnamaria Bootsreviewed tracing. ? ? ?

## 2021-11-01 ENCOUNTER — Other Ambulatory Visit: Payer: Self-pay | Admitting: Obstetrics and Gynecology

## 2021-11-01 ENCOUNTER — Ambulatory Visit: Payer: Medicaid Other | Attending: Obstetrics and Gynecology

## 2021-11-01 ENCOUNTER — Ambulatory Visit: Payer: Medicaid Other

## 2021-11-01 ENCOUNTER — Ambulatory Visit: Payer: Medicaid Other | Admitting: *Deleted

## 2021-11-01 VITALS — BP 120/63 | HR 118

## 2021-11-01 DIAGNOSIS — O34219 Maternal care for unspecified type scar from previous cesarean delivery: Secondary | ICD-10-CM | POA: Diagnosis not present

## 2021-11-01 DIAGNOSIS — O09899 Supervision of other high risk pregnancies, unspecified trimester: Secondary | ICD-10-CM | POA: Insufficient documentation

## 2021-11-01 DIAGNOSIS — O365930 Maternal care for other known or suspected poor fetal growth, third trimester, not applicable or unspecified: Secondary | ICD-10-CM | POA: Diagnosis not present

## 2021-11-01 DIAGNOSIS — O361930 Maternal care for other isoimmunization, third trimester, not applicable or unspecified: Secondary | ICD-10-CM

## 2021-11-01 DIAGNOSIS — O365990 Maternal care for other known or suspected poor fetal growth, unspecified trimester, not applicable or unspecified: Secondary | ICD-10-CM

## 2021-11-01 DIAGNOSIS — Z362 Encounter for other antenatal screening follow-up: Secondary | ICD-10-CM | POA: Diagnosis not present

## 2021-11-01 DIAGNOSIS — Z3A32 32 weeks gestation of pregnancy: Secondary | ICD-10-CM

## 2021-11-04 ENCOUNTER — Ambulatory Visit (INDEPENDENT_AMBULATORY_CARE_PROVIDER_SITE_OTHER): Payer: Medicaid Other | Admitting: Obstetrics & Gynecology

## 2021-11-04 ENCOUNTER — Encounter: Payer: Self-pay | Admitting: General Practice

## 2021-11-04 VITALS — BP 108/65 | HR 94 | Wt 172.0 lb

## 2021-11-04 DIAGNOSIS — O361930 Maternal care for other isoimmunization, third trimester, not applicable or unspecified: Secondary | ICD-10-CM

## 2021-11-04 DIAGNOSIS — O0993 Supervision of high risk pregnancy, unspecified, third trimester: Secondary | ICD-10-CM

## 2021-11-04 DIAGNOSIS — O34219 Maternal care for unspecified type scar from previous cesarean delivery: Secondary | ICD-10-CM

## 2021-11-04 DIAGNOSIS — Z2839 Other underimmunization status: Secondary | ICD-10-CM | POA: Insufficient documentation

## 2021-11-04 DIAGNOSIS — O365990 Maternal care for other known or suspected poor fetal growth, unspecified trimester, not applicable or unspecified: Secondary | ICD-10-CM

## 2021-11-04 DIAGNOSIS — Z3A32 32 weeks gestation of pregnancy: Secondary | ICD-10-CM

## 2021-11-04 NOTE — Progress Notes (Signed)
? ?PRENATAL VISIT NOTE ? ?Subjective:  ?Courtney Strong is a 31 y.o. [phone] at 73w4dbeing seen today for ongoing prenatal care.  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 ? ?Patient reports no complaints.  Contractions: Not present. Vag. Bleeding: None.  Movement: Present. Denies leaking of fluid.  ? ?The following portions of the patient's history were reviewed and updated as appropriate: allergies, current medications, past family history, past medical history, past social history, past surgical history and problem list.  ? ?Objective:  ? ?Vitals:  ? 11/04/21 0923  ?BP: 108/65  ?Pulse: 94  ?Weight: 172 lb (78 kg)  ? ? ?Fetal Status: Fetal Heart Rate (bpm): 157   Movement: Present    ? ?General:  Alert, oriented and cooperative. Patient is in no acute distress.  ?Skin: Skin is warm and dry. No rash noted.   ?Cardiovascular: Normal heart rate noted  ?Respiratory: Normal respiratory effort, no problems with respiration noted  ?Abdomen: Soft, gravid, appropriate for gestational age.  Pain/Pressure: Present     ?Pelvic: Cervical exam deferred        ?Extremities: Normal range of motion.  Edema: Trace  ?Mental Status: Normal mood and affect. Normal behavior. Normal judgment and thought content.  ? ?Imaging: ?[place]MFM FETAL BPP WO NON STRESS ? ?Result Date: 11/01/2021 ?----------------------------------------------------------------------  OBSTETRICS REPORT                       (Signed Final 11/01/2021 03:57 pm) ---------------------------------------------------------------------- Patient Info  ID #:       [id number]                         D.O.B.:  [birth year](30 yrs)   Name:       Courtney Strong                      Visit Date: 11/01/2021 02:30 pm              [place] ---------------------------------------------------------------------- Performed By  Attending:        CSander Nephew     Ref. Address:     [address]                   [place]                                                             [place]  [id number]  Performed By:     Jacob Moores BS,       [facility]                    RDMS, RVT                                [facility] at                                                             [place] [facility]                                                             Women  Referred By:      Seabron Spates CNM ---------------------------------------------------------------------- Orders  #  Description                           Code        Ordered By  1  [place] MFM OB FOLLOW UP                   76816.01    RAVI SHANKAR  2  [place] MFM UA CORD DOPPLER                76820.02    [place]  3  [place] MFM MCA DOPPLER                    76821.01    RAVI SHANKAR  4  [place] MFM FETAL BPP WO NON               76819.01    RAVI [facility]     STRESS ----------------------------------------------------------------------  #  Order #                     Accession #                Episode #  1  [id number]                   [id number]                 [id number]  2  [id number]                   [id number]                 [id number]  3  [id number]                   [id number]                 [id number]  4  [id number]                   [id number]  [id number] ---------------------------------------------------------------------- Indications  [redacted] weeks gestation of pregnancy                Z3A.71  Maternal care for known or suspected poor      O36.5930  fetal growth, third trimester, not applicable or  unspecified IUGR   Maternal care for other isoimmunization,       O36.1930  third trimester, unspecified (Anti-small c)  History of cesarean delivery, currently        O34.219  pregnant  Low Risk NIPS  Neg Horizon  Neg AFP  Encounter for other antenatal screening        Z36.2  follow-up ---------------------------------------------------------------------- Fetal Evaluation  Num Of Fetuses:         1  Fetal Heart Rate(bpm):  150  Cardiac Activity:       Observed  Presentation:           Cephalic  Placenta:               Anterior  P. Cord Insertion:      Previously Visualized  Amniotic Fluid  AFI FV:      Within normal limits  AFI Sum(cm)     %Tile       Largest Pocket(cm)  10.8            22          4.2  RUQ(cm)       RLQ(cm)       LUQ(cm)        LLQ(cm)  1.2           3.7           4.2            1.7 ---------------------------------------------------------------------- Biophysical Evaluation  Amniotic F.V:   Pocket => 2 cm             F. Tone:        Observed  F. Movement:    Observed                   Score:          8/8  F. Breathing:   Observed ---------------------------------------------------------------------- Biometry  BPD:      73.7  mm     G. Age:  29w 4d        < 1  %    CI:         66.5   %    70 - 86                                                          FL/HC:      19.4   %    19.1 - 21.3  HC:      289.7  mm     G. Age:  31w 6d         11  %    HC/AC:      1.06        0.96 - 1.17  AC:      272.4  mm     G. Age:  31w 2d         25  %    FL/BPD:     76.1   %    71 - 87  FL:       56.1  mm     G. Age:  29w 4d        1.1  %    FL/AC:      20.6   %    20 - 24  CER:      42.7  mm     G. Age:  33w 5d         69  %  LV:        3.3  mm  CM:        6.9  mm  Est. FW:    1617  gm      3 lb 9 oz      7  % ---------------------------------------------------------------------- OB History  Gravidity:    6         Term:   3        Prem:   0        SAB:   2  TOP:          0       Ectopic:  0        Living: 3  ---------------------------------------------------------------------- Gestational Age  LMP:           32w 1d        Date:  03/21/21                 EDD:   12/26/21  U/S Today:     30w 4d                                        EDD:   01/06/22  Best:          32w 1d     Det. By:  LMP  (03/21/21)          EDD:   12/26/21 ---------------------------------------------------------------------- Anatomy  Cranium:               Appears normal         LVOT:                   Previously seen  Cavum:                 Appears normal         Aortic Arch:            Previously seen  Ventricles:            Appears normal         Ductal Arch:            Previously seen  Choroid Plexus:        Previously seen        Diaphragm:              Appears normal  Cerebellum:            Previously seen        Stomach:                Appears normal, left  sided  Posterior Fossa:       Previously seen        Abdomen:                Previously seen  Nuchal Fold:           Not applicable (>15    Abdominal Wall:         Previously seen                         wks GA)  Face:                  Orbits and profile     Cord Vessels:           Previously seen                         previously seen  Lips:                  Previously seen        Kidneys:                Appear normal  Palate:                Not well visualized    Bladder:                Appears normal  Thoracic:              Appears normal         Spine:                  Previously seen  Heart:                 Appears normal         Upper Extremities:      Previously seen                         (4CH, axis, and                         situs)  RVOT:                  Previously seen        Lower Extremities:      Previously seen  Other:  Female gender previously seen previously. Feet, Nasal Bone, VC,          and 3VV visualized previously. Technically difficult due to advanced          GA and fetal position.  ---------------------------------------------------------------------- Doppler - Fetal Vessels  Umbilical Artery   S/D     %tile      RI    %tile      PI    %tile            ADFV    RDFV   2.75       55    0.64       63    0.96       62

## 2021-11-04 NOTE — Patient Instructions (Signed)
Return to office for any scheduled appointments. Call the office or go to the MAU at [facility] if: You begin to have strong, frequent contractions Your water breaks.  Sometimes it is a big gush of fluid, sometimes it is just a trickle that keeps getting your underwear wet or running down your legs You have vaginal bleeding.  It is normal to have a small amount of spotting if your cervix was checked.  You do not feel your baby moving like normal.  If you do not, get something to eat and drink and lay down and focus on feeling your baby move.   If your baby is still not moving like normal, you should call the office or go to MAU. Any other obstetric concerns.  

## 2021-11-08 LAB — AB SCR+ANTIBODY ID
Antibody Screen: POSITIVE — AB
Coombs Titer #1: 4

## 2021-11-08 LAB — ANTIBODY SCREEN

## 2021-11-10 ENCOUNTER — Encounter: Payer: Self-pay | Admitting: *Deleted

## 2021-11-10 ENCOUNTER — Ambulatory Visit: Payer: Medicaid Other | Attending: Obstetrics

## 2021-11-10 ENCOUNTER — Ambulatory Visit: Payer: Medicaid Other | Admitting: *Deleted

## 2021-11-10 VITALS — BP 118/61 | HR 72

## 2021-11-10 DIAGNOSIS — O34219 Maternal care for unspecified type scar from previous cesarean delivery: Secondary | ICD-10-CM

## 2021-11-10 DIAGNOSIS — Z2839 Other underimmunization status: Secondary | ICD-10-CM | POA: Insufficient documentation

## 2021-11-10 DIAGNOSIS — O361930 Maternal care for other isoimmunization, third trimester, not applicable or unspecified: Secondary | ICD-10-CM | POA: Insufficient documentation

## 2021-11-10 DIAGNOSIS — Z3A33 33 weeks gestation of pregnancy: Secondary | ICD-10-CM

## 2021-11-10 DIAGNOSIS — O365930 Maternal care for other known or suspected poor fetal growth, third trimester, not applicable or unspecified: Secondary | ICD-10-CM | POA: Diagnosis present

## 2021-11-10 DIAGNOSIS — O0993 Supervision of high risk pregnancy, unspecified, third trimester: Secondary | ICD-10-CM | POA: Insufficient documentation

## 2021-11-10 DIAGNOSIS — O09899 Supervision of other high risk pregnancies, unspecified trimester: Secondary | ICD-10-CM | POA: Diagnosis present

## 2021-11-10 DIAGNOSIS — O365990 Maternal care for other known or suspected poor fetal growth, unspecified trimester, not applicable or unspecified: Secondary | ICD-10-CM

## 2021-11-10 DIAGNOSIS — Z362 Encounter for other antenatal screening follow-up: Secondary | ICD-10-CM | POA: Diagnosis not present

## 2021-11-16 ENCOUNTER — Ambulatory Visit: Payer: Medicaid Other | Attending: Obstetrics

## 2021-11-16 ENCOUNTER — Ambulatory Visit (HOSPITAL_BASED_OUTPATIENT_CLINIC_OR_DEPARTMENT_OTHER): Payer: Medicaid Other | Admitting: *Deleted

## 2021-11-16 ENCOUNTER — Ambulatory Visit: Payer: Medicaid Other | Admitting: *Deleted

## 2021-11-16 ENCOUNTER — Telehealth (INDEPENDENT_AMBULATORY_CARE_PROVIDER_SITE_OTHER): Payer: Medicaid Other | Admitting: Family Medicine

## 2021-11-16 ENCOUNTER — Other Ambulatory Visit: Payer: Self-pay | Admitting: *Deleted

## 2021-11-16 ENCOUNTER — Other Ambulatory Visit: Payer: Self-pay | Admitting: Obstetrics

## 2021-11-16 VITALS — BP 127/74 | HR 87

## 2021-11-16 VITALS — BP 132/70 | HR 105

## 2021-11-16 DIAGNOSIS — O09899 Supervision of other high risk pregnancies, unspecified trimester: Secondary | ICD-10-CM | POA: Diagnosis present

## 2021-11-16 DIAGNOSIS — O365990 Maternal care for other known or suspected poor fetal growth, unspecified trimester, not applicable or unspecified: Secondary | ICD-10-CM

## 2021-11-16 DIAGNOSIS — O283 Abnormal ultrasonic finding on antenatal screening of mother: Secondary | ICD-10-CM | POA: Diagnosis present

## 2021-11-16 DIAGNOSIS — Z3A34 34 weeks gestation of pregnancy: Secondary | ICD-10-CM

## 2021-11-16 DIAGNOSIS — O34219 Maternal care for unspecified type scar from previous cesarean delivery: Secondary | ICD-10-CM

## 2021-11-16 DIAGNOSIS — Z2839 Other underimmunization status: Secondary | ICD-10-CM

## 2021-11-16 DIAGNOSIS — O0993 Supervision of high risk pregnancy, unspecified, third trimester: Secondary | ICD-10-CM | POA: Diagnosis present

## 2021-11-16 DIAGNOSIS — O365930 Maternal care for other known or suspected poor fetal growth, third trimester, not applicable or unspecified: Secondary | ICD-10-CM | POA: Diagnosis present

## 2021-11-16 DIAGNOSIS — O365931 Maternal care for other known or suspected poor fetal growth, third trimester, fetus 1: Secondary | ICD-10-CM

## 2021-11-16 DIAGNOSIS — O361930 Maternal care for other isoimmunization, third trimester, not applicable or unspecified: Secondary | ICD-10-CM | POA: Insufficient documentation

## 2021-11-16 DIAGNOSIS — O09893 Supervision of other high risk pregnancies, third trimester: Secondary | ICD-10-CM

## 2021-11-16 NOTE — Progress Notes (Signed)
Patient agrees to virtual visit. Kathrene Alu RN  ?

## 2021-11-16 NOTE — Progress Notes (Signed)
? ?OBSTETRICS PRENATAL VIRTUAL VISIT ENCOUNTER NOTE ? ?Provider location: [facility] [employer] [facility]  ? ?Patient location: Home ? ?I connected with Courtney Strong on 11/16/21 at  9:15 AM EDT by MyChart Video Encounter and verified that I am speaking with the correct person using two identifiers. I discussed the limitations, risks, security and privacy concerns of performing an evaluation and management service virtually and the availability of in person appointments. I also discussed with the patient that there may be a patient responsible charge related to this service. The patient expressed understanding and agreed to proceed. ?Subjective:  ?Courtney Strong is a 31 y.o. [phone] at 26w2dbeing seen today for ongoing prenatal care.  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 ? ?Patient reports  sharp pain in pelvis when she stands up - like a lightening bolt .  Contractions: Not present. Vag. Bleeding: None.  Movement: Present. Denies any leaking of fluid.  ? ?The following portions of the patient's history were reviewed and updated as appropriate: allergies, current medications, past family history, past medical history, past social history, past surgical history and problem list.  ? ?Objective:  ? ?Vitals:  ? 11/16/21 0905  ?BP: 127/74  ?Pulse: 87  ? ? ?Fetal Status:     Movement: Present    ? ?General:  Alert, oriented and cooperative. Patient is in no acute distress.  ?Respiratory: Normal respiratory effort, no problems with respiration noted  ?Mental Status: Normal mood and affect. Normal behavior. Normal judgment and thought  content.  ?Rest of physical exam deferred due to type of encounter ? ?Imaging: ?[place]MFM FETAL BPP W/NONSTRESS ? ?Result Date: 10/24/2021 ?----------------------------------------------------------------------  OBSTETRICS REPORT                       (Signed Final 10/24/2021 03:46 pm) ---------------------------------------------------------------------- Patient Info  ID #:       [id number]                         D.O.B.:  022-Apr-1992(30 yrs)  Name:       Courtney Strong                      Visit Date: 10/24/2021 01:40 pm              [name] ---------------------------------------------------------------------- Performed By  Attending:        VJohnell ComingsMD         Ref. Address:     [address]  [place]  Performed By:     Margaretann Loveless     Location[facility]         [facility]                    RDMS                                     [facility] at                                                             [place] for                                                             Women  Referred By:      Seabron Spates CNM ---------------------------------------------------------------------- Orders  #  Description                           Code        Ordered By  1  [place] MFM UA CORD DOPPLER                76820.02    [place]  2  [place] MFM FETAL BPP                      72536.6     RAVI [facility]     W/NONSTRESS ----------------------------------------------------------------------  #  Order #                     Accession #                Episode #  1  [id number]                   [id number]                 [id number]  2  [id number]                   [id number]                 [id number] ---------------------------------------------------------------------- Indications  Maternal care for known or suspected  poor      O36.5930  fetal growth, third trimester, not applicable or  unspecified IUGR  Maternal care for other isoimmunization,       O36.1930  third trimester, unspecified (Anti-small c)  History of cesarean delivery, currently        O45.219  pregnant  Low Risk NIPS  Neg Horizon  Neg AFP  [redacted] weeks gestation of pregnancy                Z3A.31 ---------------------------------------------------------------------- Fetal Evaluation  Num Of Fetuses:         1  Fetal Heart Rate(bpm):  148  Cardiac Activity:       Observed  Presentation:           Cephalic  Placenta:               Anterior  P. Cord Insertion:      Previously Visualized  Amniotic Fluid  AFI FV:      Within normal limits  AFI Sum(cm)     %Tile       Largest Pocket(cm)  9.85            13          3.49  RUQ(cm)       RLQ(cm)       LUQ(cm)        LLQ(cm)  3.25          1.9           3.49           1.21 ---------------------------------------------------------------------- Biophysical Evaluation  Amniotic F.V:   Pocket => 2 cm             F. Tone:        Observed  F. Movement:    Not Observed               N.S.T:          Reactive  F. Breathing:   Observed                   Score:          8/10 ---------------------------------------------------------------------- Biometry  LV:          4  mm ---------------------------------------------------------------------- OB History  Gravidity:    6         Term:   3        Prem:   0        SAB:   2  TOP:          0       Ectopic:  0        Living: 3 ---------------------------------------------------------------------- Gestational Age  LMP:           31w 0d        Date:  03/21/21                 EDD:   12/26/21  Best:          Courtney Strong 0d     Det. By:  LMP  (03/21/21)          EDD:   12/26/21 ---------------------------------------------------------------------- Anatomy  Cranium:               Appears normal         Stomach:                Appears normal, left  sided  Ventricles:            Appears normal         Kidneys:                Appear normal  Heart:                 Appears normal         Bladder:                Appears normal                         (4CH, axis, and                         situs)  Diaphragm:             Appears normal  Other:  Technically difficult due to fetal position. ---------------------------------------------------------------------- Doppler - Fetal Vessels  Umbilical Artery   S/D     %tile      RI    %tile      PI    %tile     PSV    ADFV    RDFV                                                     (cm/s)   2.82       54    0.64       55    0.87       36    46.69      No      No ---------------------------------------------------------------------- Cervix Uterus Adnexa  Cervix  Not visualized (advanced GA >24wks)  Uterus  Normal shape and size.  Right Ovary  Within normal limits.  Left Ovary  Within normal limits. ---------------------------------------------------------------------- Comments  Lenore Manner was seen due to fetal growth restriction.  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  She denies any problems since her last exam and reports  feeling vigorous fetal movements throughout the day.  There was normal amniotic fluid noted.  The patient had a biophysical profile that was 6 out of 8.  She  received a -2 for fetal breathing movements that did not meet  criteria.  She subsequently had a reactive NST making her  total biophysical profile score 8 out of 10.  Doppler studies of the umbilical arteries showed a normal  S/D ratio of 2.82. There were no signs of absent or reversed  end-diastolic flow.  Due to fetal growth restriction, she will return in 1 week for  another BPP, growth scan, and umbilical artery Doppler study. ----------------------------------------------------------------------                   Johnell Comings, MD Electronically Signed Final  Report   10/24/2021 03:46 pm ---------------------------------------------------------------------- ? ?[place] MFM FETAL BPP WO NON STRESS ? ?Result Date: 11/10/2021 ?----------------------------------------------

## 2021-11-16 NOTE — Procedures (Signed)
Cyndia Diver Mcglinchey ?27-Mar-1991 ?[redacted]w[redacted]d? ?Fetus A Non-Stress Test Interpretation for 11/16/21 ? ?Indication: Unsatisfactory BPP ? ?Fetal Heart Rate A ?Mode: External ?Baseline Rate (A): 150 bpm ?Variability: Moderate ?Accelerations: 15 x 15 ?Decelerations: Variable ?Multiple birth?: No ? ?Uterine Activity ?Mode: Palpation, Toco ?Contraction Frequency (min): none ?Resting Tone Palpated: Relaxed ? ?Interpretation (Fetal Testing) ?Nonstress Test Interpretation: Reactive ?Overall Impression: Reassuring for gestational age ?Comments: Dr. BGertie Exonreviewed tracing ? ? ?

## 2021-11-16 NOTE — Addendum Note (Signed)
Addended by: Vikki Ports on: 11/16/2021 06:17 PM ? ? Modules accepted: Level of Service ? ?

## 2021-11-23 ENCOUNTER — Ambulatory Visit: Payer: Medicaid Other | Admitting: *Deleted

## 2021-11-23 ENCOUNTER — Encounter: Payer: Self-pay | Admitting: *Deleted

## 2021-11-23 ENCOUNTER — Ambulatory Visit: Payer: Medicaid Other | Attending: Obstetrics

## 2021-11-23 VITALS — BP 127/56 | HR 80

## 2021-11-23 DIAGNOSIS — O361930 Maternal care for other isoimmunization, third trimester, not applicable or unspecified: Secondary | ICD-10-CM | POA: Insufficient documentation

## 2021-11-23 DIAGNOSIS — Z2839 Other underimmunization status: Secondary | ICD-10-CM | POA: Insufficient documentation

## 2021-11-23 DIAGNOSIS — O09899 Supervision of other high risk pregnancies, unspecified trimester: Secondary | ICD-10-CM | POA: Insufficient documentation

## 2021-11-23 DIAGNOSIS — Z3A35 35 weeks gestation of pregnancy: Secondary | ICD-10-CM

## 2021-11-23 DIAGNOSIS — O365990 Maternal care for other known or suspected poor fetal growth, unspecified trimester, not applicable or unspecified: Secondary | ICD-10-CM | POA: Diagnosis present

## 2021-11-23 DIAGNOSIS — O34219 Maternal care for unspecified type scar from previous cesarean delivery: Secondary | ICD-10-CM

## 2021-11-23 DIAGNOSIS — O0993 Supervision of high risk pregnancy, unspecified, third trimester: Secondary | ICD-10-CM | POA: Diagnosis present

## 2021-11-23 DIAGNOSIS — O365930 Maternal care for other known or suspected poor fetal growth, third trimester, not applicable or unspecified: Secondary | ICD-10-CM | POA: Insufficient documentation

## 2021-11-30 ENCOUNTER — Ambulatory Visit: Payer: Medicaid Other | Attending: Maternal & Fetal Medicine

## 2021-11-30 ENCOUNTER — Ambulatory Visit (INDEPENDENT_AMBULATORY_CARE_PROVIDER_SITE_OTHER): Payer: Medicaid Other | Admitting: Family Medicine

## 2021-11-30 ENCOUNTER — Telehealth (HOSPITAL_COMMUNITY): Payer: Self-pay | Admitting: *Deleted

## 2021-11-30 ENCOUNTER — Encounter (HOSPITAL_COMMUNITY): Payer: Self-pay | Admitting: *Deleted

## 2021-11-30 ENCOUNTER — Ambulatory Visit: Payer: Medicaid Other | Admitting: *Deleted

## 2021-11-30 VITALS — BP 110/60 | HR 94 | Wt 175.0 lb

## 2021-11-30 VITALS — BP 130/70 | HR 102

## 2021-11-30 DIAGNOSIS — O365990 Maternal care for other known or suspected poor fetal growth, unspecified trimester, not applicable or unspecified: Secondary | ICD-10-CM

## 2021-11-30 DIAGNOSIS — O361930 Maternal care for other isoimmunization, third trimester, not applicable or unspecified: Secondary | ICD-10-CM | POA: Diagnosis not present

## 2021-11-30 DIAGNOSIS — O365931 Maternal care for other known or suspected poor fetal growth, third trimester, fetus 1: Secondary | ICD-10-CM | POA: Insufficient documentation

## 2021-11-30 DIAGNOSIS — O0993 Supervision of high risk pregnancy, unspecified, third trimester: Secondary | ICD-10-CM

## 2021-11-30 DIAGNOSIS — O34219 Maternal care for unspecified type scar from previous cesarean delivery: Secondary | ICD-10-CM | POA: Diagnosis not present

## 2021-11-30 DIAGNOSIS — O09899 Supervision of other high risk pregnancies, unspecified trimester: Secondary | ICD-10-CM

## 2021-11-30 DIAGNOSIS — Z2839 Other underimmunization status: Secondary | ICD-10-CM | POA: Insufficient documentation

## 2021-11-30 DIAGNOSIS — Z3A36 36 weeks gestation of pregnancy: Secondary | ICD-10-CM

## 2021-11-30 DIAGNOSIS — O365930 Maternal care for other known or suspected poor fetal growth, third trimester, not applicable or unspecified: Secondary | ICD-10-CM

## 2021-11-30 NOTE — Progress Notes (Signed)
? ?  PRENATAL VISIT NOTE ? ?Subjective:  ?Courtney Strong is a 31 y.o. [phone] at 6w2dbeing seen today for ongoing prenatal care.  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 ? ?Patient reports  increased vaginal pain/pressure .  Contractions: Not present. Vag. Bleeding: None.  Movement: Present. Denies leaking of fluid.  ? ?The following portions of the patient's history were reviewed and updated as appropriate: allergies, current medications, past family history, past medical history, past social history, past surgical history and problem list.  ? ?Objective:  ? ?Vitals:  ? 11/30/21 0845  ?BP: 110/60  ?Pulse: 94  ?Weight: 175 lb (79.4 kg)  ? ? ?Fetal Status: Fetal Heart Rate (bpm): 150 Fundal Height: 36 cm Movement: Present  Presentation: Vertex ? ?General:  Alert, oriented and cooperative. Patient is in no acute distress.  ?Skin: Skin is warm and dry. No rash noted.   ?Cardiovascular: Normal heart rate noted  ?Respiratory: Normal respiratory effort, no problems with respiration noted  ?Abdomen: Soft, gravid, appropriate for gestational age.  Pain/Pressure: Present     ?Pelvic: Cervical exam performed in the presence of a chaperone Dilation: Fingertip Effacement (%): 50 Station: -3  ?Extremities: Normal range of motion.  Edema: None  ?Mental Status: Normal mood and affect. Normal behavior. Normal judgment and thought content.  ? ?Assessment and Plan:  ?Pregnancy: [id number] 364w2d1. [redacted] weeks gestation of pregnancy ? ?2. Supervision of high-risk pregnancy, third trimester ?FHT and FH normal ? ?3. Fetal growth restriction antepartum ?Fetal weight approx 13%.  ?MFM  recommended induction at 38 weeks. ?Will scheduled ? ?4. Maternal atypical antibody affecting pregnancy in third trimester, single or unspecified fetus ? ?5. Hx successful VBAC (vaginal birth after cesarean) x 2, currently pregnant ?Desires TOLAC ? ?6. Rubella non-immune status, antepartum ?MMR post delivery ? ? ?Preterm labor symptoms and general obstetric precautions including but not limited to vaginal bleeding, contractions, leaking of fluid and fetal movement were reviewed in detail with the patient. ?Please refer to After Visit Summary for other counseling recommendations.  ? ?No follow-ups on file. ? ?Future Appointments  ?Date Time Provider [place]?11/30/2021  2:15 PM [facility] NURSE [facility]  ?11/30/2021  2:30 PM [facility] US3 [facility]  ?12/07/2021  8:15 AM StNehemiah SettleaTanna SavoyDO [facility] None  ?12/07/2021  2:15 PM [facility] NURSE [facility]  ?12/07/2021  2:30 PM [facility] US3 [facility]  ?12/14/2021  8:15 AM StNehemiah SettleaTanna SavoyDO [facility] None  ?12/21/2021  8:15 AM DuRadene GunningMD [facility] None  ? ? ?JaTruett MainlandDO ?

## 2021-11-30 NOTE — Telephone Encounter (Signed)
Preadmission screen  

## 2021-12-01 LAB — GC/CHLAMYDIA PROBE AMP (~~LOC~~) NOT AT ARMC
Chlamydia: NEGATIVE
Comment: NEGATIVE
Comment: NORMAL
Neisseria Gonorrhea: NEGATIVE

## 2021-12-05 LAB — CULTURE, BETA STREP (GROUP B ONLY): Strep Gp B Culture: NEGATIVE

## 2021-12-07 ENCOUNTER — Ambulatory Visit: Payer: Medicaid Other | Admitting: *Deleted

## 2021-12-07 ENCOUNTER — Ambulatory Visit (INDEPENDENT_AMBULATORY_CARE_PROVIDER_SITE_OTHER): Payer: Medicaid Other | Admitting: Family Medicine

## 2021-12-07 ENCOUNTER — Ambulatory Visit: Payer: Medicaid Other | Attending: Maternal & Fetal Medicine

## 2021-12-07 VITALS — BP 113/64 | HR 89 | Wt 177.0 lb

## 2021-12-07 VITALS — BP 117/77 | HR 104

## 2021-12-07 DIAGNOSIS — O34219 Maternal care for unspecified type scar from previous cesarean delivery: Secondary | ICD-10-CM

## 2021-12-07 DIAGNOSIS — O0993 Supervision of high risk pregnancy, unspecified, third trimester: Secondary | ICD-10-CM

## 2021-12-07 DIAGNOSIS — Z3A37 37 weeks gestation of pregnancy: Secondary | ICD-10-CM

## 2021-12-07 DIAGNOSIS — Z2839 Other underimmunization status: Secondary | ICD-10-CM

## 2021-12-07 DIAGNOSIS — O361930 Maternal care for other isoimmunization, third trimester, not applicable or unspecified: Secondary | ICD-10-CM | POA: Diagnosis not present

## 2021-12-07 DIAGNOSIS — O09899 Supervision of other high risk pregnancies, unspecified trimester: Secondary | ICD-10-CM

## 2021-12-07 DIAGNOSIS — O365990 Maternal care for other known or suspected poor fetal growth, unspecified trimester, not applicable or unspecified: Secondary | ICD-10-CM | POA: Diagnosis not present

## 2021-12-07 DIAGNOSIS — O365931 Maternal care for other known or suspected poor fetal growth, third trimester, fetus 1: Secondary | ICD-10-CM | POA: Diagnosis not present

## 2021-12-07 DIAGNOSIS — O365930 Maternal care for other known or suspected poor fetal growth, third trimester, not applicable or unspecified: Secondary | ICD-10-CM

## 2021-12-07 NOTE — Progress Notes (Signed)
? ?  PRENATAL VISIT NOTE ? ?Subjective:  ?Courtney Strong is a 31 y.o. [phone] at 57w2dbeing seen today for ongoing prenatal care.  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 ? ?Patient reports  occasional vaginal pain .  Contractions: Not present. Vag. Bleeding: None.  Movement: Present. Denies leaking of fluid.  ? ?The following portions of the patient's history were reviewed and updated as appropriate: allergies, current medications, past family history, past medical history, past social history, past surgical history and problem list.  ? ?Objective:  ? ?Vitals:  ? 12/07/21 0836  ?BP: 113/64  ?Pulse: 89  ?Weight: 177 lb (80.3 kg)  ? ? ?Fetal Status: Fetal Heart Rate (bpm): 148 Fundal Height: 36 cm Movement: Present    ? ?General:  Alert, oriented and cooperative. Patient is in no acute distress.  ?Skin: Skin is warm and dry. No rash noted.   ?Cardiovascular: Normal heart rate noted  ?Respiratory: Normal respiratory effort, no problems with respiration noted  ?Abdomen: Soft, gravid, appropriate for gestational age.  Pain/Pressure: Present     ?Pelvic: Cervical exam deferred        ?Extremities: Normal range of motion.  Edema: None  ?Mental Status: Normal mood and affect. Normal behavior. Normal judgment and thought content.  ? ?Assessment and Plan:  ?Pregnancy: [id number] 378w2d1. Fetal growth restriction antepartum ?Improved.  ?IOL at 38 weeks per MFM ? ?2. Supervision of high-risk pregnancy, third trimester ? ?3. Hx successful VBAC (vaginal birth after cesarean), currently pregnant ?Desires TOLAC. ? ?4. Rubella non-immune status, antepartum ? ? ?Term labor symptoms  and general obstetric precautions including but not limited to vaginal bleeding, contractions, leaking of fluid and fetal movement were reviewed in detail with the patient. ?Please refer to After Visit Summary for other counseling recommendations.  ? ?No follow-ups on file. ? ?Future Appointments  ?Date Time Provider [place]?12/07/2021  2:15 PM [facility] NURSE [facility]  ?12/07/2021  2:30 PM [facility] US3 [facility]  ?12/13/2021  7:00 AM MC-LD SCHED ROOM [facility]-INDC None  ?01/26/2022  9:15 AM StTruett MainlandDO [facility] None  ? ? ?JaTruett MainlandDO ?

## 2021-12-08 ENCOUNTER — Other Ambulatory Visit: Payer: Self-pay | Admitting: Advanced Practice Midwife

## 2021-12-13 ENCOUNTER — Other Ambulatory Visit: Payer: Self-pay

## 2021-12-13 ENCOUNTER — Inpatient Hospital Stay (HOSPITAL_COMMUNITY): Payer: Medicaid Other | Admitting: Anesthesiology

## 2021-12-13 ENCOUNTER — Encounter (HOSPITAL_COMMUNITY): Payer: Self-pay | Admitting: Family Medicine

## 2021-12-13 ENCOUNTER — Inpatient Hospital Stay (HOSPITAL_COMMUNITY): Payer: Medicaid Other

## 2021-12-13 ENCOUNTER — Encounter (HOSPITAL_COMMUNITY)
Admission: AD | Disposition: A | Discharge status: Home / Self Care | Payer: Self-pay | Source: Home / Self Care | Attending: Family Medicine

## 2021-12-13 ENCOUNTER — Inpatient Hospital Stay (HOSPITAL_COMMUNITY)
Admission: AD | Admit: 2021-12-13 | Discharge: 2021-12-14 | DRG: 798 | Disposition: A | Discharge status: Home / Self Care | Payer: Medicaid Other | Attending: Family Medicine | Admitting: Family Medicine

## 2021-12-13 DIAGNOSIS — O365990 Maternal care for other known or suspected poor fetal growth, unspecified trimester, not applicable or unspecified: Secondary | ICD-10-CM | POA: Diagnosis present

## 2021-12-13 DIAGNOSIS — Z3490 Encounter for supervision of normal pregnancy, unspecified, unspecified trimester: Secondary | ICD-10-CM | POA: Diagnosis present

## 2021-12-13 DIAGNOSIS — Z302 Encounter for sterilization: Secondary | ICD-10-CM | POA: Diagnosis not present

## 2021-12-13 DIAGNOSIS — O365930 Maternal care for other known or suspected poor fetal growth, third trimester, not applicable or unspecified: Secondary | ICD-10-CM | POA: Diagnosis present

## 2021-12-13 DIAGNOSIS — O34219 Maternal care for unspecified type scar from previous cesarean delivery: Secondary | ICD-10-CM | POA: Diagnosis present

## 2021-12-13 DIAGNOSIS — Z3A38 38 weeks gestation of pregnancy: Secondary | ICD-10-CM

## 2021-12-13 DIAGNOSIS — O361990 Maternal care for other isoimmunization, unspecified trimester, not applicable or unspecified: Secondary | ICD-10-CM | POA: Diagnosis present

## 2021-12-13 DIAGNOSIS — O365931 Maternal care for other known or suspected poor fetal growth, third trimester, fetus 1: Secondary | ICD-10-CM | POA: Diagnosis not present

## 2021-12-13 DIAGNOSIS — O34211 Maternal care for low transverse scar from previous cesarean delivery: Secondary | ICD-10-CM | POA: Diagnosis not present

## 2021-12-13 DIAGNOSIS — A539 Syphilis, unspecified: Secondary | ICD-10-CM | POA: Diagnosis present

## 2021-12-13 DIAGNOSIS — Z87891 Personal history of nicotine dependence: Secondary | ICD-10-CM | POA: Diagnosis not present

## 2021-12-13 DIAGNOSIS — Z9851 Tubal ligation status: Secondary | ICD-10-CM

## 2021-12-13 DIAGNOSIS — Z2839 Other underimmunization status: Secondary | ICD-10-CM

## 2021-12-13 DIAGNOSIS — O09899 Supervision of other high risk pregnancies, unspecified trimester: Secondary | ICD-10-CM

## 2021-12-13 HISTORY — PX: TUBAL LIGATION: SHX77

## 2021-12-13 LAB — CBC
HCT: 34.1 % — ABNORMAL LOW (ref 36.0–46.0)
Hemoglobin: 11.8 g/dL — ABNORMAL LOW (ref 12.0–15.0)
MCH: 31.6 pg (ref 26.0–34.0)
MCHC: 34.6 g/dL (ref 30.0–36.0)
MCV: 91.4 fL (ref 80.0–100.0)
Platelets: 218 10*3/uL (ref 150–400)
RBC: 3.73 MIL/uL — ABNORMAL LOW (ref 3.87–5.11)
RDW: 13.4 % (ref 11.5–15.5)
WBC: 11.7 10*3/uL — ABNORMAL HIGH (ref 4.0–10.5)
nRBC: 0 % (ref 0.0–0.2)

## 2021-12-13 LAB — RPR
RPR Ser Ql: REACTIVE — AB
RPR Titer: 1:4 {titer}

## 2021-12-13 SURGERY — LIGATION, FALLOPIAN TUBE, POSTPARTUM
Anesthesia: Epidural | Laterality: Bilateral

## 2021-12-13 SURGERY — LIGATION, FALLOPIAN TUBE, POSTPARTUM
Anesthesia: Choice | Laterality: Bilateral

## 2021-12-13 MED ORDER — ONDANSETRON HCL 4 MG/2ML IJ SOLN
INTRAMUSCULAR | Status: AC
  Filled 2021-12-13: qty 2

## 2021-12-13 MED ORDER — IBUPROFEN 600 MG PO TABS
600.0000 mg | ORAL_TABLET | Freq: Four times a day (QID) | ORAL | Status: DC
  Administered 2021-12-13 – 2021-12-14 (×3): 600 mg via ORAL
  Filled 2021-12-13 (×3): qty 1

## 2021-12-13 MED ORDER — LIDOCAINE-EPINEPHRINE (PF) 2 %-1:200000 IJ SOLN
INTRAMUSCULAR | Status: DC | PRN
Start: 2021-12-13 — End: 2021-12-13
  Administered 2021-12-13: 5 mL via EPIDURAL

## 2021-12-13 MED ORDER — TRANEXAMIC ACID-NACL 1000-0.7 MG/100ML-% IV SOLN
INTRAVENOUS | Status: AC
  Administered 2021-12-13: 1000 mg
  Filled 2021-12-13: qty 100

## 2021-12-13 MED ORDER — BUPIVACAINE HCL (PF) 0.25 % IJ SOLN
INTRAMUSCULAR | Status: AC
  Filled 2021-12-13: qty 30

## 2021-12-13 MED ORDER — TERBUTALINE SULFATE 1 MG/ML IJ SOLN: 0.2500 mg | Freq: Once | INTRAMUSCULAR | Status: DC | PRN

## 2021-12-13 MED ORDER — OXYTOCIN BOLUS FROM INFUSION
333.0000 mL | Freq: Once | INTRAVENOUS | Status: AC
  Administered 2021-12-13: 333 mL via INTRAVENOUS

## 2021-12-13 MED ORDER — LACTATED RINGERS IV SOLN
500.0000 mL | Freq: Once | INTRAVENOUS | Status: AC
  Administered 2021-12-13: 500 mL via INTRAVENOUS

## 2021-12-13 MED ORDER — ONDANSETRON HCL 4 MG PO TABS: 4.0000 mg | ORAL_TABLET | ORAL | Status: DC | PRN

## 2021-12-13 MED ORDER — FENTANYL CITRATE (PF) 100 MCG/2ML IJ SOLN
INTRAMUSCULAR | Status: DC | PRN
  Administered 2021-12-13: 100 ug via INTRAVENOUS

## 2021-12-13 MED ORDER — LIDOCAINE-EPINEPHRINE (PF) 2 %-1:200000 IJ SOLN
INTRAMUSCULAR | Status: AC
  Filled 2021-12-13: qty 20

## 2021-12-13 MED ORDER — DIPHENHYDRAMINE HCL 50 MG/ML IJ SOLN: 12.5000 mg | INTRAMUSCULAR | Status: DC | PRN

## 2021-12-13 MED ORDER — LIDOCAINE HCL (PF) 1 % IJ SOLN: 30.0000 mL | INTRAMUSCULAR | Status: DC | PRN

## 2021-12-13 MED ORDER — ACETAMINOPHEN 325 MG PO TABS: 650.0000 mg | ORAL_TABLET | ORAL | Status: DC | PRN

## 2021-12-13 MED ORDER — OXYTOCIN-SODIUM CHLORIDE 30-0.9 UT/500ML-% IV SOLN
1.0000 m[IU]/min | INTRAVENOUS | Status: DC
  Administered 2021-12-13: 8 m[IU]/min via INTRAVENOUS

## 2021-12-13 MED ORDER — FENTANYL-BUPIVACAINE-NACL 0.5-0.125-0.9 MG/250ML-% EP SOLN
12.0000 mL/h | EPIDURAL | Status: DC | PRN
  Administered 2021-12-13: 12 mL/h via EPIDURAL
  Filled 2021-12-13: qty 250

## 2021-12-13 MED ORDER — EPHEDRINE 5 MG/ML INJ
10.0000 mg | INTRAVENOUS | Status: DC | PRN
  Filled 2021-12-13: qty 5

## 2021-12-13 MED ORDER — OXYTOCIN-SODIUM CHLORIDE 30-0.9 UT/500ML-% IV SOLN: 2.5000 [IU]/h | INTRAVENOUS | Status: DC

## 2021-12-13 MED ORDER — OXYTOCIN-SODIUM CHLORIDE 30-0.9 UT/500ML-% IV SOLN
1.0000 m[IU]/min | INTRAVENOUS | Status: DC
  Administered 2021-12-13: 1 m[IU]/min via INTRAVENOUS

## 2021-12-13 MED ORDER — FENTANYL CITRATE (PF) 100 MCG/2ML IJ SOLN
INTRAMUSCULAR | Status: AC
Start: 2021-12-13 — End: ?
  Filled 2021-12-13: qty 2

## 2021-12-13 MED ORDER — OXYTOCIN-SODIUM CHLORIDE 30-0.9 UT/500ML-% IV SOLN
1.0000 m[IU]/min | INTRAVENOUS | Status: DC
  Filled 2021-12-13: qty 500

## 2021-12-13 MED ORDER — BENZOCAINE-MENTHOL 20-0.5 % EX AERO: 1.0000 "application " | INHALATION_SPRAY | CUTANEOUS | Status: DC | PRN

## 2021-12-13 MED ORDER — EPHEDRINE 5 MG/ML INJ: 10.0000 mg | INTRAVENOUS | Status: DC | PRN

## 2021-12-13 MED ORDER — LIDOCAINE-EPINEPHRINE 2 %-1:200000 IJ SOLN
INTRAMUSCULAR | Status: AC
Start: 2021-12-13 — End: ?
  Filled 2021-12-13: qty 20

## 2021-12-13 MED ORDER — FENTANYL CITRATE (PF) 100 MCG/2ML IJ SOLN: 25.0000 ug | INTRAMUSCULAR | Status: DC | PRN

## 2021-12-13 MED ORDER — WITCH HAZEL-GLYCERIN EX PADS: 1.0000 "application " | MEDICATED_PAD | CUTANEOUS | Status: DC | PRN

## 2021-12-13 MED ORDER — BUPIVACAINE HCL (PF) 0.25 % IJ SOLN
INTRAMUSCULAR | Status: DC | PRN
  Administered 2021-12-13: 20 mL

## 2021-12-13 MED ORDER — FENTANYL CITRATE (PF) 100 MCG/2ML IJ SOLN
100.0000 ug | INTRAMUSCULAR | Status: DC | PRN
  Administered 2021-12-13: 100 ug via INTRAVENOUS
  Filled 2021-12-13: qty 2

## 2021-12-13 MED ORDER — ONDANSETRON HCL 4 MG/2ML IJ SOLN: 4.0000 mg | INTRAMUSCULAR | Status: DC | PRN

## 2021-12-13 MED ORDER — SOD CITRATE-CITRIC ACID 500-334 MG/5ML PO SOLN
30.0000 mL | ORAL | Status: DC | PRN
  Administered 2021-12-13: 30 mL via ORAL
  Filled 2021-12-13 (×2): qty 30

## 2021-12-13 MED ORDER — OXYCODONE-ACETAMINOPHEN 5-325 MG PO TABS: 2.0000 | ORAL_TABLET | ORAL | Status: DC | PRN

## 2021-12-13 MED ORDER — DIPHENHYDRAMINE HCL 25 MG PO CAPS: 25.0000 mg | ORAL_CAPSULE | Freq: Four times a day (QID) | ORAL | Status: DC | PRN

## 2021-12-13 MED ORDER — PHENYLEPHRINE 80 MCG/ML (10ML) SYRINGE FOR IV PUSH (FOR BLOOD PRESSURE SUPPORT): 80.0000 ug | PREFILLED_SYRINGE | INTRAVENOUS | Status: DC | PRN

## 2021-12-13 MED ORDER — SENNOSIDES-DOCUSATE SODIUM 8.6-50 MG PO TABS
2.0000 | ORAL_TABLET | Freq: Every day | ORAL | Status: DC
  Administered 2021-12-14: 2 via ORAL
  Filled 2021-12-13: qty 2

## 2021-12-13 MED ORDER — MEASLES, MUMPS & RUBELLA VAC IJ SOLR: 0.5000 mL | Freq: Once | INTRAMUSCULAR | Status: DC

## 2021-12-13 MED ORDER — ACETAMINOPHEN 325 MG PO TABS
650.0000 mg | ORAL_TABLET | ORAL | Status: DC | PRN
  Administered 2021-12-14 (×3): 650 mg via ORAL
  Filled 2021-12-13 (×3): qty 2

## 2021-12-13 MED ORDER — PHENYLEPHRINE 80 MCG/ML (10ML) SYRINGE FOR IV PUSH (FOR BLOOD PRESSURE SUPPORT)
80.0000 ug | PREFILLED_SYRINGE | INTRAVENOUS | Status: DC | PRN
  Filled 2021-12-13: qty 10

## 2021-12-13 MED ORDER — LIDOCAINE-EPINEPHRINE (PF) 2 %-1:200000 IJ SOLN
INTRAMUSCULAR | Status: DC | PRN
  Administered 2021-12-13 (×3): 5 mL via EPIDURAL

## 2021-12-13 MED ORDER — PRENATAL MULTIVITAMIN CH
1.0000 | ORAL_TABLET | Freq: Every day | ORAL | Status: DC
  Administered 2021-12-14: 1 via ORAL
  Filled 2021-12-13: qty 1

## 2021-12-13 MED ORDER — TRANEXAMIC ACID-NACL 1000-0.7 MG/100ML-% IV SOLN: 1000.0000 mg | Freq: Once | INTRAVENOUS | Status: DC

## 2021-12-13 MED ORDER — TETANUS-DIPHTH-ACELL PERTUSSIS 5-2.5-18.5 LF-MCG/0.5 IM SUSY: 0.5000 mL | PREFILLED_SYRINGE | Freq: Once | INTRAMUSCULAR | Status: DC

## 2021-12-13 MED ORDER — ONDANSETRON HCL 4 MG/2ML IJ SOLN
INTRAMUSCULAR | Status: DC | PRN
Start: 2021-12-13 — End: 2021-12-13
  Administered 2021-12-13: 4 mg via INTRAVENOUS

## 2021-12-13 MED ORDER — LACTATED RINGERS IV SOLN
500.0000 mL | INTRAVENOUS | Status: DC | PRN
  Administered 2021-12-13: 1000 mL via INTRAVENOUS

## 2021-12-13 MED ORDER — SIMETHICONE 80 MG PO CHEW: 80.0000 mg | CHEWABLE_TABLET | ORAL | Status: DC | PRN

## 2021-12-13 MED ORDER — OXYCODONE HCL 5 MG PO TABS
5.0000 mg | ORAL_TABLET | Freq: Four times a day (QID) | ORAL | Status: DC | PRN
  Administered 2021-12-13 – 2021-12-14 (×4): 5 mg via ORAL
  Filled 2021-12-13 (×4): qty 1

## 2021-12-13 MED ORDER — DIBUCAINE (PERIANAL) 1 % EX OINT: 1.0000 "application " | TOPICAL_OINTMENT | CUTANEOUS | Status: DC | PRN

## 2021-12-13 MED ORDER — LACTATED RINGERS IV SOLN: INTRAVENOUS | Status: DC

## 2021-12-13 MED ORDER — COCONUT OIL OIL: 1.0000 "application " | TOPICAL_OIL | Status: DC | PRN

## 2021-12-13 MED ORDER — MEDROXYPROGESTERONE ACETATE 150 MG/ML IM SUSP: 150.0000 mg | INTRAMUSCULAR | Status: DC | PRN

## 2021-12-13 MED ORDER — ONDANSETRON HCL 4 MG/2ML IJ SOLN
4.0000 mg | Freq: Four times a day (QID) | INTRAMUSCULAR | Status: DC | PRN
Start: 2021-12-13 — End: 2021-12-13
  Administered 2021-12-13: 4 mg via INTRAVENOUS
  Filled 2021-12-13: qty 2

## 2021-12-13 MED ORDER — LACTATED RINGERS IV SOLN: INTRAVENOUS | Status: DC | PRN

## 2021-12-13 MED ORDER — OXYCODONE-ACETAMINOPHEN 5-325 MG PO TABS: 1.0000 | ORAL_TABLET | ORAL | Status: DC | PRN

## 2021-12-13 SURGICAL SUPPLY — 24 items
ADH SKN CLS APL DERMABOND .7 (GAUZE/BANDAGES/DRESSINGS) ×1
BLADE SURG 11 STRL SS (BLADE) ×3 IMPLANT
DERMABOND ADVANCED (GAUZE/BANDAGES/DRESSINGS) ×1
DERMABOND ADVANCED .7 DNX12 (GAUZE/BANDAGES/DRESSINGS) IMPLANT
DRSG OPSITE POSTOP 3X4 (GAUZE/BANDAGES/DRESSINGS) ×3 IMPLANT
DURAPREP 26ML APPLICATOR (WOUND CARE) ×3 IMPLANT
GLOVE BIOGEL PI IND STRL 7.0 (GLOVE) ×2 IMPLANT
GLOVE BIOGEL PI IND STRL 7.5 (GLOVE) ×2 IMPLANT
GLOVE BIOGEL PI INDICATOR 7.0 (GLOVE) ×1
GLOVE BIOGEL PI INDICATOR 7.5 (GLOVE) ×1
GLOVE ECLIPSE 7.5 STRL STRAW (GLOVE) ×3 IMPLANT
GOWN STRL REUS W/TWL LRG LVL3 (GOWN DISPOSABLE) ×6 IMPLANT
HIBICLENS CHG 4% 4OZ BTL (MISCELLANEOUS) ×3 IMPLANT
LIGASURE IMPACT 36 18CM CVD LR (INSTRUMENTS) ×1 IMPLANT
NEEDLE HYPO 22GX1.5 SAFETY (NEEDLE) ×3 IMPLANT
NS IRRIG 1000ML POUR BTL (IV SOLUTION) ×3 IMPLANT
PACK ABDOMINAL MINOR (CUSTOM PROCEDURE TRAY) ×3 IMPLANT
PROTECTOR NERVE ULNAR (MISCELLANEOUS) ×3 IMPLANT
SPONGE LAP 4X18 RFD (DISPOSABLE) IMPLANT
SUT VICRYL 0 UR6 27IN ABS (SUTURE) ×3 IMPLANT
SUT VICRYL 4-0 PS2 18IN ABS (SUTURE) ×3 IMPLANT
SYR CONTROL 10ML LL (SYRINGE) ×3 IMPLANT
TOWEL OR 17X24 6PK STRL BLUE (TOWEL DISPOSABLE) ×6 IMPLANT
TRAY FOLEY W/BAG SLVR 14FR (SET/KITS/TRAYS/PACK) ×3 IMPLANT

## 2021-12-13 NOTE — Transfer of Care (Signed)
Immediate Anesthesia Transfer of Care Note  Patient: Courtney Strong  Procedure(s) Performed: POST PARTUM TUBAL LIGATION (Bilateral)  Patient Location: PACU  Anesthesia Type:Epidural  Level of Consciousness: awake  Airway & Oxygen Therapy: Patient Spontanous Breathing  Post-op Assessment: Report given to RN and Post -op Vital signs reviewed and stable  Post vital signs: Reviewed and stable  Last Vitals:  Vitals Value Taken Time  BP    Temp    Pulse    Resp 19 12/13/21 1756  SpO2    Vitals shown include unvalidated device data.  Last Pain:  Vitals:   12/13/21 1616  TempSrc:   PainSc: 0-No pain         Complications: No notable events documented.

## 2021-12-13 NOTE — H&P (Signed)
OBSTETRIC ADMISSION HISTORY AND PHYSICAL  Courtney Strong is a 31 y.o. female [phone] with IUP at 73w1dby LMP presenting for IOL and TOLAC for FGR. She reports +FMs, No LOF, and no VB. She plans on breast feeding. She requests BTL for birth control. She received her prenatal care at [facility]  Dating: By LMP --->  Estimated Date of Delivery: 12/26/21  Sono:   '@[redacted]w[redacted]d'$ , normal anatomy '@[redacted]w[redacted]d'$ , 1617g, 7%ile, EFW 3'9 '@[redacted]w[redacted]d'$ , cephalic, 25277O 124%MPN EFW 5'13, normal dopplers   Prenatal History/Complications:  - Fetal growth restriction  - Rubella nonimmune - previous CS - previous VBAC x2 - typical antibody- small c - Syphilis dx at 140week-treated  Past Medical History: Past Medical History:  Diagnosis Date   Abnormal Pap smear 2007   ASC-US    Bacterial vaginosis    Chlamydia    x3 , treated    Hx of trichomoniasis 2015   Migraines    UTI (lower urinary tract infection)    Vaginal Pap smear, abnormal     Past Surgical History: Past Surgical History:  Procedure Laterality Date   CESAREAN SECTION      Obstetrical History: OB History     Gravida  5   Para  3   Term  3   Preterm      AB  1   Living  3      SAB  1   IAB  0   Ectopic      Multiple  0   Live Births  3           Social History Social History   Socioeconomic History   Marital status: Single    Spouse name: Not on file   Number of children: Not on file   Years of education: Not on file   Highest education level: Not on file  Occupational History   Not on file  Tobacco Use   Smoking status: Former    Packs/day: 0.25    Years: 10.00    Pack years: 2.50    Types: Cigarettes   Smokeless tobacco: Never  Vaping Use   Vaping Use: Former   Quit date: 04/22/2021   Substances: Nicotine, Flavoring  Substance and Sexual Activity   Alcohol use: Not Currently   Drug use: Not Currently   Sexual activity: Yes    Birth control/protection: None  Other Topics Concern   Not on file   Social History Narrative   Not on file   Social Determinants of Health   Financial Resource Strain: Not on file  Food Insecurity: Not on file  Transportation Needs: Not on file  Physical Activity: Not on file  Stress: Not on file  Social Connections: Not on file    Family History: Family History  Problem Relation Age of Onset   Other Neg Hx    Hearing loss Neg Hx     Allergies: No Known Allergies  Medications Prior to Admission  Medication Sig Dispense Refill Last Dose   Prenatal Vit-Fe Fumarate-FA (PRENATAL VITAMINS PO) Take by mouth.   12/12/2021     Review of Systems   All systems reviewed and negative except as stated in HPI  Blood pressure 118/76, pulse 83, resp. rate 17, height '5\' 4"'$  (1.626 m), weight 79.9 kg, last menstrual period 03/21/2021, unknown if currently breastfeeding. General appearance: alert, cooperative, and no distress Lungs: normal work of breathing Heart: normal heart rate noted Abdomen: gravid Presentation: vertex by bedside ultrasound Fetal monitoringBaseline: 155  bpm, Variability: Good {> 6 bpm), Accelerations: Reactive, and Decelerations: Absent Uterine activity Frequency: rare Dilation: Closed Exam by:: Drake Leach, CNM  Prenatal labs: ABO, Rh: --/--/PENDING (05/23 0110) Antibody: PENDING (05/23 0110) Rubella: <0.90 (12/06 0925) RPR: Reactive (03/15 0854)  HBsAg: Negative (12/06 0925)  HIV: Non Reactive (03/15 0854)  GBS: Negative/-- (05/10 0907)  2 hr Glucola normal Genetic screening - normal AFP, negative Panorama, negative Horizon Anatomy US normal  Prenatal Transfer Tool  Maternal Diabetes: No Genetic Screening: Normal Maternal Ultrasounds/Referrals: IUGR Fetal Ultrasounds or other Referrals:  Referred to Materal Fetal Medicine  Maternal Substance Abuse:  No Significant Maternal Medications:  None Significant Maternal Lab Results: Other:  Rubella non-immune, maternal atypical antibody  Results for orders placed or performed  during the hospital encounter of 12/13/21 (from the past 24 hour(s))  Type and screen   Collection Time: 12/13/21  1:10 AM  Result Value Ref Range   ABO/RH(D) PENDING    Antibody Screen PENDING    Sample Expiration      12/16/2021,2359 Performed at [facility], 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 A6T0160 at 82w1dhere for IOL and TOLAC due to IUGR with EFW 13%. Consent for TOLAC has been signed (11/04/21).  #Labor: Latent phase. Will start low-dose Pitocin. Continue to monitor #Pain: PRN #FWB: Category 1 #ID: GBS Neg #MOF: Breastfeeding #MOC: BTL, consent signed (10/05/21)  ALeonette Nutting Medical Student  12/13/2021, 1:32 AM  Midwife attestation: I have seen and examined this patient; I agree with above documentation in the student's note.   PE: Gen: calm comfortable, NAD Resp: normal effort and rate Abd: gravid  ROS, labs, PMH reviewed  Assessment/Plan: [redacted] weeks gestation TOLAC Labor: latent FWB: Cat I GBS: neg Admit to LD Cervical ripening with low dose Pit Analgesia prn Anticipate VBAC  MJulianne Handler CNM  12/13/2021, 2:12 AM

## 2021-12-13 NOTE — Discharge Summary (Signed)
Postpartum Discharge Summary  Date of Service updated***     Patient Name: Courtney Strong DOB: 10/22/1990 MRN: [id number]  Date of admission: 12/13/2021 Delivery date:12/13/2021  Delivering provider:   Date of discharge: 12/13/2021  Admitting diagnosis: Encounter for induction of labor [Z34.90] Intrauterine pregnancy: [redacted]w[redacted]d    Secondary diagnosis:  Active Problems:   Encounter for induction of labor  Additional problems: none    Discharge diagnosis: Term Pregnancy Delivered                                              Post partum procedures:postpartum tubal ligation Augmentation: AROM, Pitocin, and IP Foley Complications: None  Hospital course: Induction of Labor With Vaginal Delivery   31y.o. yo [id number] 359w1das admitted to the hospital 12/13/2021 for induction of labor.  Indication for induction:  FGR .  Patient had an uncomplicated labor course as follows: Membrane Rupture Time/Date: 12:01 PM ,12/13/2021   Delivery Method:  Episiotomy:   Lacerations:    Details of delivery can be found in separate delivery note.  Patient had a routine postpartum course. Patient is discharged home 12/13/21.  Newborn Data: Birth date:12/13/2021  Birth time:3:22 PM  Gender:Female  Living status:  Apgars: ,  Weight:   Magnesium Sulfate received: No BMZ received: No Rhophylac:N/A MMR:N/A T-DaP:Given prenatally Flu: N/A Transfusion:No  Physical exam  Vitals:   12/13/21 1331 12/13/21 1401 12/13/21 1431 12/13/21 1501  BP: 116/66 111/64 124/72 125/65  Pulse: 66 70 71 74  Resp: 16  18   Temp: 97.8 F (36.6 C)     TempSrc: Oral     SpO2:      Weight:      Height:       General: {Exam; general:21111117} Lochia: {Desc; appropriate/inappropriate:30686::"appropriate"} Uterine Fundus: {Desc; firm/soft:30687} Incision: {Exam; incision:21111123} DVT Evaluation: {Exam; dvt:2111122} Labs: Lab Results  Component Value Date   WBC 11.7 (H) 12/13/2021   HGB 11.8 (L)  12/13/2021   HCT 34.1 (L) 12/13/2021   MCV 91.4 12/13/2021   PLT 218 12/13/2021      Latest Ref Rng & Units 03/07/2021    9:04 AM  CMP  Glucose 70 - 99 mg/dL 97    BUN 6 - 20 mg/dL 13    Creatinine 0.44 - 1.00 mg/dL 0.55    Sodium 135 - 145 mmol/L 137    Potassium 3.5 - 5.1 mmol/L 4.1    Chloride 98 - 111 mmol/L 103    CO2 22 - 32 mmol/L 26    Calcium 8.9 - 10.3 mg/dL 9.4    Total Protein 6.5 - 8.1 g/dL 7.9    Total Bilirubin 0.3 - 1.2 mg/dL 0.5    Alkaline Phos 38 - 126 U/L 48    AST 15 - 41 U/L 22    ALT 0 - 44 U/L 28     Edinburgh Score:    08/26/2018   10:38 AM  Edinburgh Postnatal Depression Scale Screening Tool  I have been able to laugh and see the funny side of things. 0  I have looked forward with enjoyment to things. 0  I have blamed myself unnecessarily when things went wrong. 0  I have been anxious or worried for no good reason. 0  I have felt scared or panicky for no good reason. 0  Things have been getting on  top of me. 0  I have been so unhappy that I have had difficulty sleeping. 0  I have felt sad or miserable. 0  I have been so unhappy that I have been crying. 0  The thought of harming myself has occurred to me. 0  Edinburgh Postnatal Depression Scale Total 0     After visit meds:  Allergies as of 12/13/2021   No Known Allergies   Med Rec must be completed prior to using this [facility]***        Discharge home in stable condition Infant Feeding: {Baby feeding:23562} Infant Disposition:{CHL IP OB HOME WITH KCLEXN:17001} Discharge instruction: per After Visit Summary and Postpartum booklet. Activity: Advance as tolerated. Pelvic rest for 6 weeks.  Diet: {OB VCBS:49675916} Future Appointments: Future Appointments  Date Time Provider [place]  01/26/2022  9:15 AM Truett Mainland, DO [facility] None   Follow up Visit: patient already scheduled for PPV.  12/13/2021 Truett Mainland, DO

## 2021-12-13 NOTE — Progress Notes (Addendum)
Courtney Strong is a 31 y.o. [id number] at 85w1dby LMP admitted for induction of labor due to Poor fetal growth.  Subjective: Comfortable with epidural  Objective: BP 140/65 (BP Location: Left Arm)   Pulse 80   Temp 97.9 F (36.6 C) (Oral)   Resp 16   Ht '5\' 4"'$  (1.626 m)   Wt 79.9 kg   LMP 03/21/2021   SpO2 100%   BMI 30.24 kg/m  No intake/output data recorded. No intake/output data recorded.  FHT:  FHR: 140 bpm, variability: moderate,  accelerations:  Present,  decelerations:  Absent UC:   regular, every 3-4 minutes SVE:   Dilation: 4 Effacement (%): 50 Station: -2 Exam by:: MMargarite Gouge RN  Labs: Lab Results  Component Value Date   WBC 11.7 (H) 12/13/2021   HGB 11.8 (L) 12/13/2021   HCT 34.1 (L) 12/13/2021   MCV 91.4 12/13/2021   PLT 218 12/13/2021    Assessment / Plan: Induction of labor due to IUGR,  progressing well on pitocin  Labor: Progressing normally. AROM with clear fluid. IUPC placed Preeclampsia:  no signs or symptoms of toxicity Fetal Wellbeing:  Category I Pain Control:  Epidural I/D:  n/a Anticipated MOD:  NSVD  JTruett Mainland5/23/2023, 12:20 PM

## 2021-12-13 NOTE — Anesthesia Preprocedure Evaluation (Addendum)
Anesthesia Evaluation  Patient identified by MRN, date of birth, ID band Patient awake    Reviewed: Allergy & Precautions, NPO status , Patient's Chart, lab work & pertinent test results  Airway Mallampati: II  TM Distance: >3 FB Neck ROM: Full    Dental no notable dental hx. (+) Dental Advisory Given   Pulmonary neg pulmonary ROS, former smoker,    Pulmonary exam normal breath sounds clear to auscultation       Cardiovascular negative cardio ROS Normal cardiovascular exam Rhythm:Regular Rate:Normal     Neuro/Psych  Headaches, negative psych ROS   GI/Hepatic negative GI ROS, Neg liver ROS,   Endo/Other  negative endocrine ROS  Renal/GU negative Renal ROS  negative genitourinary   Musculoskeletal negative musculoskeletal ROS (+)   Abdominal   Peds  Hematology negative hematology ROS (+)   Anesthesia Other Findings IOL for FGR, TOLAC  Reproductive/Obstetrics (+) Pregnancy                             Anesthesia Physical Anesthesia Plan  ASA: 2  Anesthesia Plan: Epidural   Post-op Pain Management:    Induction:   PONV Risk Score and Plan: Treatment may vary due to age or medical condition  Airway Management Planned: Natural Airway  Additional Equipment:   Intra-op Plan:   Post-operative Plan:   Informed Consent: I have reviewed the patients History and Physical, chart, labs and discussed the procedure including the risks, benefits and alternatives for the proposed anesthesia with the patient or authorized representative who has indicated his/her understanding and acceptance.       Plan Discussed with: Anesthesiologist  Anesthesia Plan Comments: (Patient identified. Risks, benefits, options discussed with patient including but not limited to bleeding, infection, nerve damage, paralysis, failed block, incomplete pain control, headache, blood pressure changes, nausea, vomiting,  reactions to medication, itching, and post partum back pain. Confirmed with bedside nurse the patient's most recent platelet count. Confirmed with the patient that they are not taking any anticoagulation, have any bleeding history or any family history of bleeding disorders. Patient expressed understanding and wishes to proceed. All questions were answered. )        Anesthesia Quick Evaluation

## 2021-12-13 NOTE — Progress Notes (Signed)
Patient ID: Courtney Strong, female   DOB: [birth year], 31 y.o.   MRN: [id number]  Risks of procedure discussed with patient including but not limited to: risk of regret, permanence of method, bleeding, infection, injury to surrounding organs and need for additional procedures.  Failure risk of 1 -2 % with increased risk of ectopic gestation if pregnancy occurs was also discussed with patient.    Truett Mainland, DO 12/13/2021 3:40 PM

## 2021-12-13 NOTE — Progress Notes (Addendum)
Labor Progress Note Courtney Strong is a 31 y.o. R1M2111 at 82w1dpresented for FGR  S:  Feeling some ctx. Declines pain meds.  O:  BP (!) 117/58   Pulse 79   Resp 17   Ht '5\' 4"'$  (1.626 m)   Wt 79.9 kg   LMP 03/21/2021   BMI 30.24 kg/m  EFM: baseline 135 bpm/ mod variability/ + accels/ no decels  Toco/IUPC: 3-4 SVE: Dilation: 1.5 Effacement (%): 50 Station: -2 Presentation: Vertex Exam by:: MColman CaterCNM Pitocin: 6 mu/min  A/P: 31y.o. [id number]. Labor: latent 2. FWB: Cat I 3. Pain: analgesia/anesthesia/NO prn   Consented for FB insertion, tolerated well. Continue low dose Pit for ripening. Anticipate labor progress and SVD.  MeJulianne HandlerCNM 6:32 AM

## 2021-12-13 NOTE — Anesthesia Preprocedure Evaluation (Signed)
Anesthesia Evaluation  Patient identified by MRN, date of birth, ID band Patient awake    Reviewed: Allergy & Precautions, NPO status , Patient's Chart, lab work & pertinent test results  Airway Mallampati: II  TM Distance: >3 FB Neck ROM: Full    Dental no notable dental hx.    Pulmonary neg pulmonary ROS, former smoker,    Pulmonary exam normal breath sounds clear to auscultation       Cardiovascular negative cardio ROS Normal cardiovascular exam Rhythm:Regular Rate:Normal     Neuro/Psych  Headaches, negative psych ROS   GI/Hepatic negative GI ROS, Neg liver ROS,   Endo/Other  negative endocrine ROS  Renal/GU negative Renal ROS  negative genitourinary   Musculoskeletal negative musculoskeletal ROS (+)   Abdominal   Peds  Hematology negative hematology ROS (+)   Anesthesia Other Findings PPTL. Epidural in place and worked well for delivery.   Reproductive/Obstetrics                             Anesthesia Physical Anesthesia Plan  ASA: 2  Anesthesia Plan: Epidural   Post-op Pain Management: Toradol IV (intra-op)* and Ofirmev IV (intra-op)*   Induction:   PONV Risk Score and Plan: 2 and Treatment may vary due to age or medical condition, Ondansetron and Dexamethasone  Airway Management Planned: Natural Airway  Additional Equipment:   Intra-op Plan:   Post-operative Plan:   Informed Consent: I have reviewed the patients History and Physical, chart, labs and discussed the procedure including the risks, benefits and alternatives for the proposed anesthesia with the patient or authorized representative who has indicated his/her understanding and acceptance.       Plan Discussed with: Anesthesiologist  Anesthesia Plan Comments: (Plan to use pre-existing epidural. )        Anesthesia Quick Evaluation

## 2021-12-13 NOTE — Lactation Note (Signed)
This note was copied from a baby's chart. Lactation Consultation Note Declines Lactation services.  Patient Name: Courtney Strong Date: 12/13/2021   Age:31 hours  Maternal Data    Feeding    LATCH Score                    Lactation Tools Discussed/Used    Interventions    Discharge    Consult Status Consult Status: Complete    Theodoro Kalata 12/13/2021, 7:20 PM

## 2021-12-13 NOTE — Op Note (Signed)
Courtney Strong 12/13/2021  PREOPERATIVE DIAGNOSIS:  Undesired fertility  POSTOPERATIVE DIAGNOSIS:  Undesired fertility  PROCEDURE:  Postpartum Bilateral Salpingectomy  SURGEON:  Dr Loma Boston  ASSISTANT: Dr. Renard Matter  ANESTHESIA:  Epidural  COMPLICATIONS:  None immediate.  ESTIMATED BLOOD LOSS:  25cc  FLUIDS: 1000cc LR.  URINE OUTPUT:  25 mL of clear urine.  INDICATIONS: 31 y.o. yo [id number]  with undesired fertility,status post vaginal delivery, desires permanent sterilization. Risks and benefits of procedure discussed with patient including permanence of method, bleeding, infection, injury to surrounding organs and need for additional procedures. Risk failure of 0.5-1% with increased risk of ectopic gestation if pregnancy occurs was also discussed with patient.   FINDINGS:  Normal uterus, tubes, and ovaries.  TECHNIQUE:  The patient was taken to the operating room where her epidural anesthesia was dosed up to surgical level and found to be adequate. She was then placed in the dorsal supine position and prepped and draped in sterile fashion.  An adequate timeout was performed. At that time 20cc total of 0.25% marcaine was injected (10cc on lateral edge of incision site on each side). Attention was turned to the patient's abdomen where a small transverse skin incision was made under the umbilical fold. The incision was taken down to the layer of fascia using the scalpel, and fascia was incised, and extended bilaterally using Mayo scissors. The peritoneum was entered in a sharp fashion. Attention was then turned to the patient's uterus, and right fallopian tube was identified and followed out to the fimbriated end.    The patient's right  fallopian tube was then identified, and the Babcock clamp was then used to grasp the tube. A ligasure device was then utilized to cauterize and then remove approximately ~4-5 cm segment of the tube (done in three different segments). Excellent  hemostasis noted. The left fallopian tube was then identified and the same procedure was carried through on the left side.  Good hemostasis was noted overall.  The instruments were then removed from the patient's abdomen and the fascial incision was repaired with 0 Vicryl. The skin was closed with a 3-0 vicryl subcuticular stitch. The patient tolerated the procedure well.  Sponge, lap, and needle counts were correct times two.  The patient was then taken to the recovery room awake and in stable condition.  Renard Matter, MD, MPH OB Fellow, Faculty Practice

## 2021-12-13 NOTE — Anesthesia Procedure Notes (Addendum)
Epidural Patient location during procedure: OB Start time: 12/13/2021 11:10 AM End time: 12/13/2021 11:20 AM  Staffing Anesthesiologist: Freddrick March, MD Performed: anesthesiologist   Preanesthetic Checklist Completed: patient identified, IV checked, risks and benefits discussed, monitors and equipment checked, pre-op evaluation and timeout performed  Epidural Patient position: sitting Prep: DuraPrep and site prepped and draped Patient monitoring: continuous pulse ox, blood pressure, heart rate and cardiac monitor Approach: midline Location: L3-L4 Injection technique: LOR air  Needle:  Needle type: Tuohy  Needle gauge: 17 G Needle length: 9 cm Needle insertion depth: 5 cm Catheter type: closed end flexible Catheter size: 19 Gauge Catheter at skin depth: 10 cm Test dose: negative  Assessment Sensory level: T8 Events: blood not aspirated, injection not painful, no injection resistance, no paresthesia and negative IV test  Additional Notes Patient identified. Risks/Benefits/Options discussed with patient including but not limited to bleeding, infection, nerve damage, paralysis, failed block, incomplete pain control, headache, blood pressure changes, nausea, vomiting, reactions to medication both or allergic, itching and postpartum back pain. Confirmed with bedside nurse the patient's most recent platelet count. Confirmed with patient that they are not currently taking any anticoagulation, have any bleeding history or any family history of bleeding disorders. Patient expressed understanding and wished to proceed. All questions were answered. Sterile technique was used throughout the entire procedure. Please see nursing notes for vital signs. Test dose was given through epidural catheter and negative prior to continuing to dose epidural or start infusion. Warning signs of high block given to the patient including shortness of breath, tingling/numbness in hands, complete motor block,  or any concerning symptoms with instructions to call for help. Patient was given instructions on fall risk and not to get out of bed. All questions and concerns addressed with instructions to call with any issues or inadequate analgesia.  Reason for block:procedure for pain

## 2021-12-14 ENCOUNTER — Encounter (HOSPITAL_COMMUNITY): Payer: Self-pay | Admitting: Family Medicine

## 2021-12-14 ENCOUNTER — Encounter: Payer: Medicaid Other | Admitting: Family Medicine

## 2021-12-14 ENCOUNTER — Other Ambulatory Visit (HOSPITAL_COMMUNITY): Payer: Self-pay

## 2021-12-14 DIAGNOSIS — Z9851 Tubal ligation status: Secondary | ICD-10-CM

## 2021-12-14 LAB — CBC
HCT: 29.5 % — ABNORMAL LOW (ref 36.0–46.0)
Hemoglobin: 10.3 g/dL — ABNORMAL LOW (ref 12.0–15.0)
MCH: 31.9 pg (ref 26.0–34.0)
MCHC: 34.9 g/dL (ref 30.0–36.0)
MCV: 91.3 fL (ref 80.0–100.0)
Platelets: 184 10*3/uL (ref 150–400)
RBC: 3.23 MIL/uL — ABNORMAL LOW (ref 3.87–5.11)
RDW: 13.7 % (ref 11.5–15.5)
WBC: 11.1 10*3/uL — ABNORMAL HIGH (ref 4.0–10.5)
nRBC: 0 % (ref 0.0–0.2)

## 2021-12-14 LAB — T.PALLIDUM AB, TOTAL: T Pallidum Abs: REACTIVE — AB

## 2021-12-14 MED ORDER — IBUPROFEN 600 MG PO TABS
600.0000 mg | ORAL_TABLET | Freq: Four times a day (QID) | ORAL | 0 refills | Status: DC | PRN
Start: 2021-12-14 — End: 2022-03-24
  Filled 2021-12-14: qty 60, 15d supply, fill #0

## 2021-12-14 MED ORDER — ACETAMINOPHEN 325 MG PO TABS: 650.0000 mg | ORAL_TABLET | ORAL | Status: DC | PRN

## 2021-12-14 MED ORDER — OXYCODONE HCL 5 MG PO TABS
5.0000 mg | ORAL_TABLET | Freq: Four times a day (QID) | ORAL | 0 refills | Status: DC | PRN
  Filled 2021-12-14: qty 10, 3d supply, fill #0

## 2021-12-14 NOTE — Anesthesia Postprocedure Evaluation (Signed)
Anesthesia Post Note  Patient: Courtney Strong  Procedure(s) Performed: POST PARTUM TUBAL LIGATION (Bilateral)     Patient location during evaluation: PACU Anesthesia Type: Epidural Level of consciousness: oriented and awake and alert Pain management: pain level controlled Vital Signs Assessment: post-procedure vital signs reviewed and stable Respiratory status: spontaneous breathing, respiratory function stable and patient connected to nasal cannula oxygen Cardiovascular status: blood pressure returned to baseline and stable Postop Assessment: no headache, no backache, no apparent nausea or vomiting and epidural receding Anesthetic complications: no   No notable events documented.  Last Vitals:  Vitals:   12/14/21 0251 12/14/21 0300  BP:  112/66  Pulse:  76  Resp:  18  Temp:  36.7 C  SpO2: 98%     Last Pain:  Vitals:   12/14/21 1014  TempSrc:   PainSc: 6                  Ellamay Fors L Tranae Laramie

## 2021-12-14 NOTE — Anesthesia Postprocedure Evaluation (Signed)
Anesthesia Post Note  Patient: Courtney Strong  Procedure(s) Performed: AN AD HOC LABOR EPIDURAL     Patient location during evaluation: Mother Baby Anesthesia Type: Epidural Level of consciousness: awake and alert Pain management: pain level controlled Vital Signs Assessment: post-procedure vital signs reviewed and stable Respiratory status: spontaneous breathing, nonlabored ventilation and respiratory function stable Cardiovascular status: stable Postop Assessment: no headache, no backache and epidural receding Anesthetic complications: no   No notable events documented.  Last Vitals:  Vitals:   12/14/21 0251 12/14/21 0300  BP:  112/66  Pulse:  76  Resp:  18  Temp:  36.7 C  SpO2: 98%     Last Pain:  Vitals:   12/14/21 0422  TempSrc:   PainSc: 3    Pain Goal:                   Jazalyn Mondor

## 2021-12-16 LAB — TYPE AND SCREEN
ABO/RH(D): A POS
Antibody Screen: POSITIVE
Donor AG Type: NEGATIVE
Donor AG Type: NEGATIVE
Unit division: 0
Unit division: 0

## 2021-12-16 LAB — BPAM RBC
Blood Product Expiration Date: 202306092359
Blood Product Expiration Date: 202306102359
Unit Type and Rh: 6200
Unit Type and Rh: 6200

## 2021-12-16 LAB — SURGICAL PATHOLOGY

## 2021-12-21 ENCOUNTER — Telehealth (HOSPITAL_COMMUNITY): Payer: Self-pay | Admitting: *Deleted

## 2021-12-21 ENCOUNTER — Encounter: Payer: Medicaid Other | Admitting: Obstetrics and Gynecology

## 2021-12-21 NOTE — Telephone Encounter (Signed)
Left phone voicemail message.  Odis Hollingshead, RN 12-21-2021 at 2:52pm

## 2022-01-26 ENCOUNTER — Ambulatory Visit: Payer: Medicaid Other | Admitting: Family Medicine

## 2022-01-26 NOTE — Progress Notes (Deleted)
    [facility] Partum Visit Note  Courtney Strong is a 31 y.o. [phone] female who presents for a postpartum visit. She is 6 weeks postpartum following a normal spontaneous vaginal delivery.  I have fully reviewed the prenatal and intrapartum course. The delivery was at 38.1 gestational weeks.  Anesthesia: epidural. Postpartum course has been uneventful. Baby is doing well. Baby is feeding by {breastmilk/bottle:69}. Bleeding {vag bleed:12292}. Bowel function is {normal:32111}. Bladder function is {normal:32111}. Patient {is/is not:9024} sexually active. Contraception method is tubal ligation. Postpartum depression screening: {gen negative/positive:315881}.   The pregnancy intention screening data noted above was reviewed. Potential methods of contraception were discussed. The patient elected to proceed with No data recorded.    Health Maintenance Due  Topic Date Due   COVID-19 Vaccine (1) Never done   PAP-Cervical Cytology Screening  01/16/2021   PAP SMEAR-Modifier  01/16/2021    {Common ambulatory SmartLinks:19316}  Review of Systems {ros; complete:30496}  Objective:  LMP 03/21/2021    General:  {gen appearance:16600}   Breasts:  {desc; normal/abnormal/not indicated:14647}  Lungs: {lung exam:16931}  Heart:  {heart exam:5510}  Abdomen: {abdomen exam:16834}   Wound {Wound assessment:11097}  GU exam:  {desc; normal/abnormal/not indicated:14647}       Assessment:    There are no diagnoses linked to this encounter.  *** postpartum exam.   Plan:   Essential components of care per ACOG recommendations:  1.  Mood and well being: Patient with {gen negative/positive:315881} depression screening today. Reviewed local resources for support.  - Patient tobacco use? {tobacco use:25506}  - hx of drug use? {yes/no:25505}    2. Infant care and feeding:  -Patient currently breastmilk feeding? {yes/no:25502}  -Social determinants of health (SDOH) reviewed in EPIC. No concerns***The  following needs were identified***  3. Sexuality, contraception and birth spacing - Patient {DOES_DOES ZES:92330} want a pregnancy in the next year.  Desired family size is {NUMBER 1-10:22536} children.  - Reviewed reproductive life planning. Reviewed contraceptive methods based on pt preferences and effectiveness.  Patient desired {Upstream End Methods:24109} today.   - Discussed birth spacing of 18 months  4. Sleep and fatigue -Encouraged family/partner/community support of 4 hrs of uninterrupted sleep to help with mood and fatigue  5. Physical Recovery  - Discussed patients delivery and complications. She describes her labor as {description:25511} - Patient had a {CHL AMB [phone]}. Patient had a {laceration:25518} laceration. Perineal healing reviewed. Patient expressed understanding - Patient has urinary incontinence? {yes/no:25515} - Patient {ACTION; IS/IS QTM:22633354} safe to resume physical and sexual activity  6.  Health Maintenance - HM due items addressed {Yes or If no, why not?:20788} - Last pap smear  Diagnosis  Date Value Ref Range Status  01/16/2018   Final   NEGATIVE FOR INTRAEPITHELIAL LESIONS OR MALIGNANCY.   Pap smear {done:10129} at today's visit.  -Breast Cancer screening indicated? {indicated:25516}  7. Chronic Disease/Pregnancy Condition follow up: {Follow up:25499}  - PCP follow up  Kathrene Alu, [facility] [employer], [place]

## 2022-03-24 ENCOUNTER — Emergency Department (HOSPITAL_BASED_OUTPATIENT_CLINIC_OR_DEPARTMENT_OTHER): Payer: Medicaid Other

## 2022-03-24 ENCOUNTER — Other Ambulatory Visit: Payer: Self-pay

## 2022-03-24 ENCOUNTER — Emergency Department (HOSPITAL_BASED_OUTPATIENT_CLINIC_OR_DEPARTMENT_OTHER)
Admission: EM | Admit: 2022-03-24 | Discharge: 2022-03-24 | Disposition: A | Discharge status: Home / Self Care | Payer: Medicaid Other | Attending: Emergency Medicine | Admitting: Emergency Medicine

## 2022-03-24 ENCOUNTER — Encounter (HOSPITAL_BASED_OUTPATIENT_CLINIC_OR_DEPARTMENT_OTHER): Payer: Self-pay

## 2022-03-24 DIAGNOSIS — M545 Low back pain, unspecified: Secondary | ICD-10-CM | POA: Diagnosis not present

## 2022-03-24 DIAGNOSIS — R001 Bradycardia, unspecified: Secondary | ICD-10-CM | POA: Insufficient documentation

## 2022-03-24 DIAGNOSIS — K0889 Other specified disorders of teeth and supporting structures: Secondary | ICD-10-CM | POA: Diagnosis not present

## 2022-03-24 DIAGNOSIS — R109 Unspecified abdominal pain: Secondary | ICD-10-CM | POA: Insufficient documentation

## 2022-03-24 LAB — URINALYSIS, ROUTINE W REFLEX MICROSCOPIC
Bilirubin Urine: NEGATIVE
Glucose, UA: NEGATIVE mg/dL
Hgb urine dipstick: NEGATIVE
Ketones, ur: NEGATIVE mg/dL
Nitrite: NEGATIVE
Protein, ur: NEGATIVE mg/dL
Specific Gravity, Urine: 1.022 (ref 1.005–1.030)
pH: 5.5 (ref 5.0–8.0)

## 2022-03-24 LAB — COMPREHENSIVE METABOLIC PANEL
ALT: 25 U/L (ref 0–44)
AST: 22 U/L (ref 15–41)
Albumin: 5.1 g/dL — ABNORMAL HIGH (ref 3.5–5.0)
Alkaline Phosphatase: 66 U/L (ref 38–126)
Anion gap: 9 (ref 5–15)
BUN: 10 mg/dL (ref 6–20)
CO2: 25 mmol/L (ref 22–32)
Calcium: 10.2 mg/dL (ref 8.9–10.3)
Chloride: 104 mmol/L (ref 98–111)
Creatinine, Ser: 0.72 mg/dL (ref 0.44–1.00)
GFR, Estimated: >60 mL/min (ref >60)
Glucose, Bld: 91 mg/dL (ref 70–99)
Potassium: 4.1 mmol/L (ref 3.5–5.1)
Sodium: 138 mmol/L (ref 135–145)
Total Bilirubin: 0.4 mg/dL (ref 0.3–1.2)
Total Protein: 8.3 g/dL — ABNORMAL HIGH (ref 6.5–8.1)

## 2022-03-24 LAB — CBC
HCT: 41.4 % (ref 36.0–46.0)
Hemoglobin: 14.1 g/dL (ref 12.0–15.0)
MCH: 30.2 pg (ref 26.0–34.0)
MCHC: 34.1 g/dL (ref 30.0–36.0)
MCV: 88.7 fL (ref 80.0–100.0)
Platelets: 241 10*3/uL (ref 150–400)
RBC: 4.67 MIL/uL (ref 3.87–5.11)
RDW: 13.8 % (ref 11.5–15.5)
WBC: 8.2 10*3/uL (ref 4.0–10.5)
nRBC: 0 % (ref 0.0–0.2)

## 2022-03-24 LAB — LIPASE, BLOOD: Lipase: <10 U/L — ABNORMAL LOW (ref 11–51)

## 2022-03-24 LAB — PREGNANCY, URINE: Preg Test, Ur: NEGATIVE

## 2022-03-24 MED ORDER — KETOROLAC TROMETHAMINE 15 MG/ML IJ SOLN
15.0000 mg | Freq: Once | INTRAMUSCULAR | Status: AC
  Administered 2022-03-24: 15 mg via INTRAVENOUS
  Filled 2022-03-24: qty 1

## 2022-03-24 MED ORDER — AMOXICILLIN-POT CLAVULANATE 875-125 MG PO TABS
1.0000 | ORAL_TABLET | Freq: Two times a day (BID) | ORAL | 0 refills | Status: DC
Start: 2022-03-24 — End: 2022-05-07

## 2022-03-24 MED ORDER — LIDOCAINE 5 % EX PTCH
2.0000 | MEDICATED_PATCH | CUTANEOUS | Status: DC
  Administered 2022-03-24: 2 via TRANSDERMAL
  Filled 2022-03-24: qty 2

## 2022-03-24 MED ORDER — IBUPROFEN 600 MG PO TABS: 600.0000 mg | ORAL_TABLET | Freq: Four times a day (QID) | ORAL | 0 refills | Status: DC | PRN

## 2022-03-24 MED ORDER — METHOCARBAMOL 500 MG PO TABS: 500.0000 mg | ORAL_TABLET | Freq: Two times a day (BID) | ORAL | 0 refills | Status: DC

## 2022-03-24 MED ORDER — IOHEXOL 300 MG/ML  SOLN
100.0000 mL | Freq: Once | INTRAMUSCULAR | Status: AC | PRN
Start: 2022-03-24 — End: 2022-03-24
  Administered 2022-03-24: 80 mL via INTRAVENOUS

## 2022-03-24 NOTE — ED Notes (Signed)
Patient transported to CT 

## 2022-03-24 NOTE — ED Notes (Signed)
Reviewed AVS/discharge instruction with patient. Time allotted for and all questions answered. Patient is agreeable for d/c and escorted to ed exit by staff.  

## 2022-03-24 NOTE — ED Provider Notes (Signed)
[facility] Provider Note   CSN: [id number] Arrival date & time: 03/24/22  1258     History Chief Complaint  Patient presents with   Back Pain    Courtney Strong is a 31 y.o. female otherwise healthy presents to the emergency department for evaluation of lower back pain/suprapubic abdominal pain since 0500 this morning.  She reports that she was picking up her daughter to breast-feed her when she started to have the pain.  She reports that ibuprofen relieves this pain.  She denies any nausea, vomiting, dysuria, hematuria, nausea, vomiting, fevers, urinary incontinence, fecal incontinence, saddle anesthesia, IV drug use, problems walking/talking.  Denies any vaginal complaints. She reports that she was seen in urgent care but was sent over here for further evaluation.  Additionally, she mentions she has been having some lower dental pain on the left.  Denies any trouble swallowing or decreased appetite or eating.  Patient had a recent VBAC in May that was uncomplicated.  No known drug allergies.  Occasional marijuana use.   Back Pain Associated symptoms: abdominal pain   Associated symptoms: no chest pain, no dysuria, no fever, no headaches and no weakness        Home Medications Prior to Admission medications   Medication Sig Start Date End Date Taking? Authorizing Provider  acetaminophen (TYLENOL) 325 MG tablet Take 2 tablets (650 mg total) by mouth every 4 (four) hours as needed (for pain scale < 4). 12/14/21   Patriciaann Clan, DO  ibuprofen (ADVIL) 600 MG tablet Take 1 tablet (600 mg total) by mouth every 6 (six) hours as needed. 12/14/21   Patriciaann Clan, DO  oxyCODONE (OXY IR/ROXICODONE) 5 MG immediate release tablet Take 1 tablet (5 mg total) by mouth every 6 (six) hours as needed for severe pain. 12/14/21   Patriciaann Clan, DO      Allergies    Patient has no known allergies.    Review of Systems   Review of Systems  Constitutional:   Negative for chills and fever.  Respiratory:  Negative for shortness of breath.   Cardiovascular:  Negative for chest pain.  Gastrointestinal:  Positive for abdominal pain. Negative for constipation, diarrhea, nausea and vomiting.  Genitourinary:  Negative for dysuria, frequency, hematuria, urgency, vaginal bleeding, vaginal discharge and vaginal pain.  Musculoskeletal:  Positive for back pain.  Neurological:  Negative for dizziness, syncope, weakness, light-headedness and headaches.    Physical Exam Updated Vital Signs BP 101/75   Pulse (!) 58   Temp 98 F (36.7 C) (Oral)   Resp 16   Ht '5\' 4"'$  (1.626 m)   Wt 79.9 kg   SpO2 97%   BMI 30.24 kg/m  Physical Exam Vitals and nursing note reviewed.  Constitutional:      General: She is not in acute distress.    Appearance: Normal appearance. She is not ill-appearing or toxic-appearing.  HENT:     Head: Normocephalic and atraumatic.     Right Ear: Tympanic membrane, ear canal and external ear normal.     Left Ear: Tympanic membrane, ear canal and external ear normal.     Mouth/Throat:     Mouth: Mucous membranes are moist.     Pharynx: No oropharyngeal exudate or posterior oropharyngeal erythema.     Comments: Dental caries present on patient's back left molar.  No draining abscess.  No induration or fluctuance seen.  Uvula midline.  No soft palate deviation.  No trismus.  Greater than  3 fingerbreadths wide.  No facial swelling noted. Eyes:     General: No scleral icterus. Cardiovascular:     Rate and Rhythm: Normal rate and regular rhythm.  Pulmonary:     Effort: Pulmonary effort is normal.     Breath sounds: Normal breath sounds.  Abdominal:     General: Bowel sounds are normal.     Palpations: Abdomen is soft.     Tenderness: There is no abdominal tenderness. There is no guarding or rebound.     Comments: No abdominal tenderness to palpation. NBS. No overlying skin changes seen other than tattoos. No masses of  hepatosplenomegaly palpated.   Musculoskeletal:        General: No deformity.     Cervical back: Normal range of motion.     Comments: Bilateral lower lumbar paraspinal tenderness to palpation. No overlying skin changes or warmth, other than tattoos, seen. No bony step offs or deformities. No midline cervical, thoracic, or lumbar tenderness to palpation. Ambulatory. Sensation intact. Strength 5/5 in patient's upper and lower bilateral extremities. Compartments soft. Pulses intact.   Skin:    General: Skin is warm and dry.  Neurological:     General: No focal deficit present.     Mental Status: She is alert. Mental status is at baseline.     Sensory: No sensory deficit.     Motor: No weakness.     Gait: Gait normal.     ED Results / Procedures / Treatments   Labs (all labs ordered are listed, but only abnormal results are displayed) Labs Reviewed  LIPASE, BLOOD - Abnormal; Notable for the following components:      Result Value   Lipase <10 (*)    All other components within normal limits  COMPREHENSIVE METABOLIC PANEL - Abnormal; Notable for the following components:   Total Protein 8.3 (*)    Albumin 5.1 (*)    All other components within normal limits  URINALYSIS, ROUTINE W REFLEX MICROSCOPIC - Abnormal; Notable for the following components:   APPearance CLOUDY (*)    Leukocytes,Ua SMALL (*)    All other components within normal limits  CBC  PREGNANCY, URINE    EKG None  Radiology CT L-SPINE NO CHARGE  Result Date: 03/24/2022 CLINICAL DATA:  Back pain EXAM: CT LUMBAR SPINE WITHOUT CONTRAST TECHNIQUE: Multidetector CT imaging of the lumbar spine was performed without intravenous contrast administration. Multiplanar CT image reconstructions were also generated. RADIATION DOSE REDUCTION: This exam was performed according to the departmental dose-optimization program which includes automated exposure control, adjustment of the mA and/or kV according to patient size and/or use  of iterative reconstruction technique. COMPARISON:  CT 03/17/2017. FINDINGS: Segmentation: 5 lumbar type vertebrae. Alignment: Straightening of the lumbar lordosis. No significant listhesis. Vertebrae: There is no evidence of acute fracture or aggressive osseous lesion. There is an anterior superior limbus vertebrae of L4. Paraspinal and other soft tissues: Negative. Disc levels: There is degenerative disc disease at T11-T12 and T12-L1 with mild disc bulging but no significant stenosis of these levels. Mild degenerative disc disease at L1-L2, L2-L3, L3-L4, and L5-S1 without significant spinal canal or neural foraminal stenosis. Minimal multilevel facet arthropathy. IMPRESSION: No acute lumbar spine fracture. Mild multilevel degenerative disc disease and facet arthropathy. No significant spinal canal or neural foraminal stenosis. Electronically Signed   By: Maurine Simmering M.D.   On: 03/24/2022 15:17   CT ABDOMEN PELVIS W CONTRAST  Result Date: 03/24/2022 CLINICAL DATA:  Acute nonlocalized abdominal pain. Patient reports  lower abdominal and mid back pain since this morning. EXAM: CT ABDOMEN AND PELVIS WITH CONTRAST TECHNIQUE: Multidetector CT imaging of the abdomen and pelvis was performed using the standard protocol following bolus administration of intravenous contrast. RADIATION DOSE REDUCTION: This exam was performed according to the departmental dose-optimization program which includes automated exposure control, adjustment of the mA and/or kV according to patient size and/or use of iterative reconstruction technique. CONTRAST:  95m OMNIPAQUE IOHEXOL 300 MG/ML  SOLN COMPARISON:  Noncontrast CT 09/21/2020 FINDINGS: Lower chest: Clear lung bases. Hepatobiliary: No focal liver abnormality is seen. No gallstones, gallbladder wall thickening, or biliary dilatation. Pancreas: No ductal dilatation or inflammation. Spleen: Normal in size without focal abnormality. Splenule inferiorly. Adrenals/Urinary Tract: Normal  adrenal glands. No hydronephrosis, perinephric edema, or renal calculi. No suspicious renal lesion. Urinary bladder is nondistended, not well assessed. Stomach/Bowel: The stomach is decompressed. The duodenum crosses the midline, however the majority of the small bowel is located in the right abdomen. The cecum is on a lax mesentery and located in the central abdomen just to the left of midline. This is similar in appearance to prior exam, slightly more elevated on the current exam likely due to stool distension. Normal appendix is visualized in the central abdomen. There is no bowel obstruction or inflammatory change. No bowel pneumatosis. Vascular/Lymphatic: Normal caliber abdominal aorta. No acute vascular findings. Patent portal, splenic, mesenteric vasculature. No abdominopelvic adenopathy. Reproductive: Uterus and bilateral adnexa are unremarkable. Other: Trace free fluid in the pelvis, likely physiologic. No upper abdominal ascites. No free air. Tiny fat containing umbilical hernia. Musculoskeletal: Lumbar spine assessed on concurrent lumbar spine exam, reported separately. No fracture or focal lesion involving the bony pelvis. Included ribs are intact. IMPRESSION: 1. No acute abnormality in the abdomen/pelvis. 2. 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 This is similar in appearance to prior exam, slightly more elevated current exam likely due to stool distension. No bowel obstruction or inflammatory change. Electronically Signed   By: MKeith RakeM.D.   On: 03/24/2022 15:07    Procedures Procedures   Medications Ordered in ED Medications  ketorolac (TORADOL) 15 MG/ML injection 15 mg (has no administration in time range)  lidocaine (LIDODERM) 5 % 2 patch (has no administration in time range)  iohexol (OMNIPAQUE) 300 MG/ML solution 100 mL (80 mLs Intravenous Contrast Given 03/24/22  1442)    ED Course/ Medical Decision Making/ A&P                           Medical Decision Making Amount and/or Complexity of Data Reviewed Labs: ordered. Radiology: ordered.  Risk Prescription drug management.   31year old female presents the emergency department for evaluation of lower abdominal pain in bilateral lower back pain.  Differential diagnosis includes but is not limited to UTI, cauda equina, nephrolithiasis, sciatica, epidural abscess, musculoskeletal pain.  Vital signs are unremarkable.  Physical exam as noted above.  Given the pack pain radiates into her abdomen, suspicious for any UTI, kidney stone, cannot rule out appendicitis.  Will order CT abdomen and add on CT of L-spine.  I independently reviewed and interpreted the patient's labs.  Urinalysis shows cloudy urine with small amount of leukocytes however only 0-5 white blood cells and no bacteria or nitrite seen.  Doubt any UTI.  CMP shows mildly increased total protein and albumin otherwise no  electrolyte or LFT abnormalities.  Lipase is less than 10.  Pregnancy test negative.  CBC without leukocytosis or anemia.  Normal platelets.  CT Abd/Pelvis - 1. No acute abnormality in the abdomen/pelvis. 2. 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 This is similar in appearance to prior exam, slightly more elevated current exam likely due to stool distension. No bowel obstruction or inflammatory change.   CT Lumbar -  No acute lumbar spine fracture. Mild multilevel degenerative disc disease and facet arthropathy. No significant spinal canal or neural foraminal stenosis.  Imaging reassuring, this is likely MSK pain. Doubt any epidural abscess or cauda equina given no leukocytosis, fever, or red flag symptoms.  Nursing staff alerted me that the patient was going down into the 40s with the lowest being 38 when she slept.  When the patient awoke, she was in the 46s. The patient denies any signs of symptomatic bradycardia. Will place consult page to cardiology just incase given her post partum status.   5:24 PM Spoke with Dr. Harrell Gave with cardiology who does not think that the patient needs to follow up with a cardiologist at this time and can discuss this with her PCP. If she were to start having symptoms, she can follow up with cardiology.   Spoke with Lavella Lemons, [facility] about muscle relaxers.  We will give robaxin.  My attending assessed at bedside and agrees with discharge home.  I discussed with the patient her lab and imaging results.  Discussed that this is likely musculoskeletal pain.  Recommended take 600 mg of ibuprofen every 6 hours.  Discussed that she could use the muscle relaxers although there is questionable literature that this may cross over into her breastmilk.  I asked that she uses at her own discretion since she is breast-feeding.  Additionally, we will give her Augmentin for her dental infection.  Dental resources given.  Recommended follow-up with a dentist.  We discussed strict return precautions and red flag symptoms.  Patient verbalizes her understanding and agrees to the plan.  Patient is stable being discharged home in good condition.  I discussed this case with my attending physician who cosigned this note including patient's presenting symptoms, physical exam, and planned diagnostics and interventions. Attending physician stated agreement with plan or made changes to plan which were implemented.   Attending physician assessed patient at bedside.   Final Clinical Impression(s) / ED Diagnoses Final diagnoses:  Bradycardia  Acute bilateral low back pain without sciatica  Pain, dental    Rx / DC Orders ED Discharge Orders          Ordered    methocarbamol (ROBAXIN) 500 MG tablet  2 times daily        03/24/22 1757    ibuprofen (ADVIL) 600 MG tablet  Every 6 hours PRN        03/24/22  1757    amoxicillin-clavulanate (AUGMENTIN) 875-125 MG tablet  Every 12 hours        03/24/22 1757              Sherrell Puller, PA-C 03/28/22 2150    Fransico Meadow, MD 03/30/22 [phone]

## 2022-03-24 NOTE — Discharge Instructions (Addendum)
You were seen in the ER for evaluation of your lower back pain.  Your lab work and imaging were unremarkable.  I likely think that you have a lumbar strain.  For this, you can take ibuprofen 600 mg every 6 hours as needed for pain.  You can add on 1000 mg of Tylenol to this for worsening pain.  I recommend picking up over-the-counter lidocaine patches to apply to the area as well.  You can take Robaxin 500 mg every 12 hours, although there are some studies that think that this may get into your breastmilk.  I have spoken to the pharmacist who thinks that this should be okay however I have included more information on this.  Use to your discretion.  Additionally, it was noted that you have a lower heart rate.  I spoke with cardiology and they are nonconcerning.  You can follow-up with your primary care doctor as directed.  Additionally, I am placing you on Augmentin for you to take twice daily for the next 7 days.  I included information for dentist in the area for you to follow-up with.  If you have any fevers, unable to control your bowels or bladder, worsening pain, numbness, tingling, weakness, please return to the nearest emergency department for reevaluation.  Contact a health care provider if: You have pain that is not relieved with rest or medicine. You have increasing pain going down into your legs or buttocks. Your pain does not improve after 2 weeks. You have pain at night. You lose weight without trying. You have a fever or chills. You develop nausea or vomiting. You develop abdominal pain. Get help right away if: You develop new bowel or bladder control problems. You have unusual weakness or numbness in your arms or legs. You feel faint. These symptoms may represent a serious problem that is an emergency. Do not wait to see if the symptoms will go away. Get medical help right away. Call your local emergency services (911 in the [place] Do not drive yourself to the hospital.

## 2022-03-24 NOTE — ED Triage Notes (Signed)
Patient here POV from Home.  Endorses Lower ABD Pain and Mid Back Pain that began at 0500 this AM. No Known Injury. No N/V/D. No Dysuria.   Seen at [facility] Today and UA was clear. Sent for Evaluation.  NAD noted during Triage. A&Ox4. GCS 15. Ambulatory.

## 2022-04-22 ENCOUNTER — Ambulatory Visit (HOSPITAL_COMMUNITY)
Admission: EM | Admit: 2022-04-22 | Discharge: 2022-04-22 | Disposition: A | Discharge status: Home / Self Care | Payer: Medicaid Other | Attending: Internal Medicine | Admitting: Internal Medicine

## 2022-04-22 ENCOUNTER — Encounter (HOSPITAL_COMMUNITY): Payer: Self-pay

## 2022-04-22 DIAGNOSIS — J029 Acute pharyngitis, unspecified: Secondary | ICD-10-CM

## 2022-04-22 LAB — POCT RAPID STREP A, ED / UC: Streptococcus, Group A Screen (Direct): NEGATIVE

## 2022-04-22 MED ORDER — LIDOCAINE VISCOUS HCL 2 % MT SOLN: 15.0000 mL | OROMUCOSAL | 0 refills | Status: DC | PRN

## 2022-04-22 MED ORDER — PREDNISONE 20 MG PO TABS: 40.0000 mg | ORAL_TABLET | Freq: Every day | ORAL | 0 refills | Status: DC

## 2022-04-22 NOTE — Discharge Instructions (Signed)
Your strep test was negative for bacteria to the throat and therefore we must treat your symptoms as a viral process meaning they will need to resolve with time for the next week  You may gargle and spit lidocaine solution every 4 hours as needed to provide a temporary relief of pain  Even though it is not routine treatment we will attempt use of prednisone every morning for the next 5 days to reduce the swelling to the lymph nodes  You may use over-the-counter Tylenol 500 to 1000 mg every 6 hours  May attempt to gargle salt water, Listerine gargles, throat lozenges softer foods and warm liquids  Until you are able to tolerate food is normal you will need to increase your fluid intake to maintain your hydration  The lymph nodes are part of our drainage system and holding warm compresses and massaging them to help them to drain and will help to reduce their size  May follow-up with urgent care as needed

## 2022-04-22 NOTE — ED Provider Notes (Signed)
[place]    CSN: [id number] Arrival date & time: 04/22/22  1503      History   Chief Complaint Chief Complaint  Patient presents with   Sore Throat   Lymphadenopathy    HPI Courtney Strong is a 31 y.o. female.   Patient presents with sore throat and swollen lymph nodes for 2 days, endorses symptoms are worsening.  Painful to swallow with decreased intake and poor fluid intake.  Has attempted use of 1 old antibiotic pill which was ineffective.  No known sick contacts.  Currently lactating.  Denies fever, chills, body aches, ear pain, congestion, coughing.    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 95/03/3266   Syphilis 06/30/2021   Supervision of high-risk pregnancy, third trimester 06/28/2021   Hx successful VBAC (vaginal birth after cesarean) x 2, currently pregnant 11/20/2012    Past Surgical History:  Procedure Laterality Date   CESAREAN SECTION     TUBAL LIGATION Bilateral 12/13/2021   Procedure: POST PARTUM TUBAL LIGATION;  Surgeon: Truett Mainland, DO;  Location: [facility];  Service: Gynecology;  Laterality: Bilateral;    OB History     Gravida  5   Para  4   Term  4   Preterm      AB  1   Living  4      SAB  1   IAB  0   Ectopic      Multiple  0   Live Births  4            Home Medications    Prior to Admission medications   Medication Sig Start Date End Date Taking? Authorizing Provider  acetaminophen (TYLENOL) 325 MG  tablet Take 2 tablets (650 mg total) by mouth every 4 (four) hours as needed (for pain scale < 4). 12/14/21   Patriciaann Clan, DO  amoxicillin-clavulanate (AUGMENTIN) 875-125 MG tablet Take 1 tablet by mouth every 12 (twelve) hours. 03/24/22   Sherrell Puller, PA-C  ibuprofen (ADVIL) 600 MG tablet Take 1 tablet (600 mg total) by mouth every 6 (six) hours as needed. 03/24/22   Sherrell Puller, PA-C  methocarbamol (ROBAXIN) 500 MG tablet Take 1 tablet (500 mg total) by mouth 2 (two) times daily. 03/24/22   Sherrell Puller, PA-C  oxyCODONE (OXY IR/ROXICODONE) 5 MG immediate release tablet Take 1 tablet (5 mg total) by mouth every 6 (six) hours as needed for severe pain. 12/14/21   Patriciaann Clan, DO    Family History Family History  Problem Relation Age of Onset   Other Neg Hx    Hearing loss Neg Hx     Social History Social History   Tobacco Use   Smoking status: Former    Packs/day: 0.25    Years: 10.00    Total pack years: 2.50    Types: Cigarettes   Smokeless tobacco: Never  Vaping Use  Vaping Use: Former   Quit date: 04/22/2021   Substances: Nicotine, Flavoring  Substance Use Topics   Alcohol use: Not Currently   Drug use: Yes    Frequency: 1.0 times per week    Types: Marijuana     Allergies   Patient has no known allergies.   Review of Systems Review of Systems Defer to HPI   Physical Exam Triage Vital Signs ED Triage Vitals  Enc Vitals Group     BP 04/22/22 1548 109/68     Pulse Rate 04/22/22 1548 88     Resp 04/22/22 1548 12     Temp 04/22/22 1548 99.1 F (37.3 C)     Temp Source 04/22/22 1548 Oral     SpO2 04/22/22 1548 99 %     Weight 04/22/22 1547 175 lb (79.4 kg)     Height 04/22/22 1547 '5\' 5"'$  (1.651 m)     Head Circumference --      Peak Flow --      Pain Score 04/22/22 1546 8     Pain Loc --      Pain Edu? --      Excl. in [place]? --    No data found.  Updated Vital Signs BP 109/68 (BP Location: Left Arm)   Pulse 88   Temp 99.1 F (37.3 C) (Oral)    Resp 12   Ht '5\' 5"'$  (1.651 m)   Wt 175 lb (79.4 kg)   SpO2 99%   BMI 29.12 kg/m   Visual Acuity Right Eye Distance:   Left Eye Distance:   Bilateral Distance:    Right Eye Near:   Left Eye Near:    Bilateral Near:     Physical Exam Constitutional:      Appearance: She is well-developed.  HENT:     Right Ear: Tympanic membrane and ear canal normal.     Left Ear: Tympanic membrane and ear canal normal.     Nose: No congestion or rhinorrhea.     Mouth/Throat:     Pharynx: No posterior oropharyngeal erythema.     Tonsils: No tonsillar exudate. 0 on the right. 0 on the left.  Cardiovascular:     Rate and Rhythm: Normal rate and regular rhythm.     Heart sounds: Normal heart sounds.  Pulmonary:     Effort: Pulmonary effort is normal.     Breath sounds: Normal breath sounds.  Musculoskeletal:     Cervical back: Normal range of motion.  Lymphadenopathy:     Cervical: Cervical adenopathy present.  Skin:    General: Skin is warm and dry.  Neurological:     General: No focal deficit present.     Mental Status: She is alert and oriented to person, place, and time.  Psychiatric:        Mood and Affect: Mood normal.        Behavior: Behavior normal.      UC Treatments / Results  Labs (all labs ordered are listed, but only abnormal results are displayed) Labs Reviewed - No data to display  EKG   Radiology No results found.  Procedures Procedures (including critical care time)  Medications Ordered in UC Medications - No data to display  Initial Impression / Assessment and Plan / UC Course  I have reviewed the triage vital signs and the nursing notes.  Pertinent labs & imaging results that were available during my care of the patient were reviewed by me and considered in my medical  decision making (see chart for details).  Viral pharyngitis  Patient is in no signs of distress nor toxic appearing.  Vital signs are stable.  Low suspicion for pneumonia,  pneumothorax or bronchitis and therefore will defer imaging.  Strep test negative, prescribed prednisone due to extent of discomfort and viscous lidocaine   May use additional over-the-counter medications as needed for supportive care.  May follow-up with urgent care as needed if symptoms persist or worsen.  Note given.   Final Clinical Impressions(s) / UC Diagnoses   Final diagnoses:  None   Discharge Instructions   None    ED Prescriptions   None    PDMP not reviewed this encounter.   Hans Eden, [place] 04/22/22 [phone]

## 2022-04-22 NOTE — ED Triage Notes (Signed)
Pt is here for swollen lymphnodes causing pain and discomfort in the throat x2days

## 2022-05-07 ENCOUNTER — Encounter (HOSPITAL_COMMUNITY): Payer: Self-pay | Admitting: Emergency Medicine

## 2022-05-07 ENCOUNTER — Ambulatory Visit (HOSPITAL_COMMUNITY)
Admission: EM | Admit: 2022-05-07 | Discharge: 2022-05-07 | Disposition: A | Discharge status: Home / Self Care | Payer: Medicaid Other | Attending: Physician Assistant | Admitting: Physician Assistant

## 2022-05-07 DIAGNOSIS — N61 Mastitis without abscess: Secondary | ICD-10-CM | POA: Diagnosis not present

## 2022-05-07 MED ORDER — AMOXICILLIN-POT CLAVULANATE 875-125 MG PO TABS: 1.0000 | ORAL_TABLET | Freq: Two times a day (BID) | ORAL | 0 refills | Status: DC

## 2022-05-07 NOTE — ED Provider Notes (Signed)
[place]    CSN: [id number] Arrival date & time: 05/07/22  1303      History   Chief Complaint Chief Complaint  Patient presents with   Possible Mastitis    HPI Courtney Strong is a 31 y.o. female.   Patient here today for evaluation of possible infection to her left breast.  She reports that her daughter currently has thrush and she has noticed that she has more significant pain and swelling to her left breast with a skin lesion that has not appeared.  She reports she has felt feverish at times but has not measured temperature.  She has tried pumping as well as breast-feeding without significant improvement and difficulty with same due to pain.  The history is provided by the patient.    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 46/96/2952   Syphilis 06/30/2021   Supervision of high-risk pregnancy, third trimester 06/28/2021   Hx successful VBAC (vaginal birth after cesarean) x 2, currently pregnant 11/20/2012    Past Surgical History:  Procedure Laterality Date   CESAREAN SECTION     TUBAL LIGATION Bilateral 12/13/2021   Procedure: POST PARTUM TUBAL LIGATION;  Surgeon: Truett Mainland, DO;  Location: [facility];  Service: Gynecology;  Laterality: Bilateral;    OB History     Gravida  5   Para  4   Term  4   Preterm      AB  1   Living  4      SAB  1   IAB  0   Ectopic      Multiple  0   Live Births  4            Home  Medications    Prior to Admission medications   Medication Sig Start Date End Date Taking? Authorizing Provider  amoxicillin-clavulanate (AUGMENTIN) 875-125 MG tablet Take 1 tablet by mouth every 12 (twelve) hours. 05/07/22  Yes Francene Finders, PA-C  acetaminophen (TYLENOL) 325 MG tablet Take 2 tablets (650 mg total) by mouth every 4 (four) hours as needed (for pain scale < 4). 12/14/21   Patriciaann Clan, DO  ibuprofen (ADVIL) 600 MG tablet Take 1 tablet (600 mg total) by mouth every 6 (six) hours as needed. 03/24/22   Sherrell Puller, PA-C  lidocaine (XYLOCAINE) 2 % solution Use as directed 15 mLs in the mouth or throat every 4 (four) hours as needed for mouth pain. 04/22/22   White, Leitha Schuller, NP  methocarbamol (ROBAXIN) 500 MG tablet Take 1 tablet (500 mg total) by mouth 2 (two) times daily. 03/24/22   Sherrell Puller, PA-C  oxyCODONE (OXY IR/ROXICODONE) 5 MG immediate release tablet Take 1 tablet (5 mg total) by mouth every 6 (six) hours as needed for severe pain. 12/14/21   Patriciaann Clan, DO  predniSONE (DELTASONE) 20 MG tablet Take 2 tablets (40 mg total) by mouth  daily. 04/22/22   Hans Eden, NP    Family History Family History  Problem Relation Age of Onset   Other Neg Hx    Hearing loss Neg Hx     Social History Social History   Tobacco Use   Smoking status: Former    Packs/day: 0.25    Years: 10.00    Total pack years: 2.50    Types: Cigarettes   Smokeless tobacco: Never  Vaping Use   Vaping Use: Former   Quit date: 04/22/2021   Substances: Nicotine, Flavoring  Substance Use Topics   Alcohol use: Not Currently   Drug use: Yes    Frequency: 1.0 times per week    Types: Marijuana     Allergies   Patient has no known allergies.   Review of Systems Review of Systems  Constitutional:  Negative for chills and fever.  Eyes:  Negative for discharge and redness.  Gastrointestinal:  Negative for nausea and vomiting.  Skin:  Positive for color change. Negative for  wound.     Physical Exam Triage Vital Signs ED Triage Vitals [05/07/22 1414]  Enc Vitals Group     BP (!) 100/59     Pulse Rate 69     Resp 16     Temp 97.9 F (36.6 C)     Temp Source Oral     SpO2 96 %     Weight      Height      Head Circumference      Peak Flow      Pain Score 9     Pain Loc      Pain Edu?      Excl. in [place]?    No data found.  Updated Vital Signs BP (!) 100/59 (BP Location: Left Arm)   Pulse 69   Temp 97.9 F (36.6 C) (Oral)   Resp 16   SpO2 96%   Breastfeeding Yes      Physical Exam Vitals and nursing note reviewed.  Constitutional:      General: She is not in acute distress.    Appearance: Normal appearance. She is not ill-appearing.  HENT:     Head: Normocephalic and atraumatic.  Eyes:     Conjunctiva/sclera: Conjunctivae normal.  Cardiovascular:     Rate and Rhythm: Normal rate.  Pulmonary:     Effort: Pulmonary effort is normal.  Skin:    Comments: Diffuse erythema surrounding left nipple with small pustule noted to medial left nipple area  Neurological:     Mental Status: She is alert.  Psychiatric:        Mood and Affect: Mood normal.        Behavior: Behavior normal.        Thought Content: Thought content normal.      UC Treatments / Results  Labs (all labs ordered are listed, but only abnormal results are displayed) Labs Reviewed - No data to display  EKG   Radiology No results found.  Procedures Procedures (including critical care time)  Medications Ordered in UC Medications - No data to display  Initial Impression / Assessment and Plan / UC Course  I have reviewed the triage vital signs and the nursing notes.  Pertinent labs & imaging results that were available during my care of the patient were reviewed by me and considered in my medical decision making (see chart for details).    We will treat to cover infection with Augmentin.  Recommended follow-up if  no gradual improvement or with any further  concerns.  Patient expresses understanding.  Final Clinical Impressions(s) / UC Diagnoses   Final diagnoses:  Mastitis, left, acute   Discharge Instructions   None    ED Prescriptions     Medication Sig Dispense Auth. Provider   amoxicillin-clavulanate (AUGMENTIN) 875-125 MG tablet Take 1 tablet by mouth every 12 (twelve) hours. 14 tablet Francene Finders, PA-C      PDMP not reviewed this encounter.   Francene Finders, PA-C 05/07/22 1454

## 2022-05-07 NOTE — ED Triage Notes (Signed)
Pt reports an infection on the left breast. 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 States the left breast is swollen, red and has a blister around it.

## 2022-08-15 ENCOUNTER — Ambulatory Visit: Payer: Medicaid Other

## 2022-08-24 ENCOUNTER — Ambulatory Visit: Payer: Medicaid Other | Admitting: Family Medicine

## 2022-09-03 ENCOUNTER — Encounter (HOSPITAL_COMMUNITY): Payer: Self-pay

## 2022-09-03 ENCOUNTER — Ambulatory Visit (HOSPITAL_COMMUNITY)
Admission: EM | Admit: 2022-09-03 | Discharge: 2022-09-03 | Disposition: A | Discharge status: Home / Self Care | Payer: Medicaid Other | Attending: Emergency Medicine | Admitting: Emergency Medicine

## 2022-09-03 DIAGNOSIS — N12 Tubulo-interstitial nephritis, not specified as acute or chronic: Secondary | ICD-10-CM | POA: Insufficient documentation

## 2022-09-03 LAB — POCT URINALYSIS DIPSTICK, ED / UC
Bilirubin Urine: NEGATIVE
Glucose, UA: NEGATIVE mg/dL
Ketones, ur: NEGATIVE mg/dL
Nitrite: POSITIVE — AB
Protein, ur: NEGATIVE mg/dL
Specific Gravity, Urine: 1.02 (ref 1.005–1.030)
Urobilinogen, UA: 0.2 mg/dL (ref 0.0–1.0)
pH: 5.5 (ref 5.0–8.0)

## 2022-09-03 LAB — POC URINE PREG, ED: Preg Test, Ur: NEGATIVE

## 2022-09-03 MED ORDER — CEFTRIAXONE SODIUM 1 G IJ SOLR
1.0000 g | Freq: Once | INTRAMUSCULAR | Status: AC
Start: 2022-09-03 — End: 2022-09-03
  Administered 2022-09-03: 1 g via INTRAMUSCULAR

## 2022-09-03 MED ORDER — PHENAZOPYRIDINE HCL 200 MG PO TABS: 200.0000 mg | ORAL_TABLET | Freq: Three times a day (TID) | ORAL | 0 refills | Status: AC | PRN

## 2022-09-03 MED ORDER — CEFTRIAXONE SODIUM 1 G IJ SOLR
INTRAMUSCULAR | Status: AC
  Filled 2022-09-03: qty 10

## 2022-09-03 MED ORDER — CEPHALEXIN 500 MG PO CAPS: 500.0000 mg | ORAL_CAPSULE | Freq: Four times a day (QID) | ORAL | 0 refills | Status: AC

## 2022-09-03 MED ORDER — LIDOCAINE HCL (PF) 1 % IJ SOLN
INTRAMUSCULAR | Status: AC
  Filled 2022-09-03: qty 2

## 2022-09-03 NOTE — ED Provider Notes (Addendum)
HPI  SUBJECTIVE:  Courtney Strong is a 32 y.o. female who presents with odorous urine for the past week, 4 days of discomfort with urination, feels that she still needs to urinate, and is urinating small amounts at a time, urinary urgency, frequency, cloudy urine.  She reports left back pain starting today.  No fevers, nausea, vomiting, abdominal, pelvic pain, vaginal odor, bleeding, change in her baseline vaginal discharge.  She states that she has not had intercourse in over 3 months.  No antibiotics in the past month.  No antipyretic in the past 6 hours.  She has not tried anything for symptoms.  Her back pain is worse with standing up, better with sitting still.  She has a past medical history of UTI, pyelonephritis for which she was admitted, nonobstructing nephrolithiasis.  She states this does not feel like previous episodes of nephrolithiasis.  She also has a history of syphilis, trichomonas, BV, yeast.  No history of other STDs, diabetes.  LMP: Status post bilateral tubal ligation.  Denies possibility being pregnant.  She is 10 months postpartum and is breast-feeding.  PCP: [facility].   Past Medical History:  Diagnosis Date   Abnormal Pap smear 2007   ASC-US    Bacterial vaginosis    Chlamydia    x3 , treated    Hx of trichomoniasis 2015   Migraines    UTI (lower urinary tract infection)    Vaginal Pap smear, abnormal     Past Surgical History:  Procedure Laterality Date   CESAREAN SECTION     TUBAL LIGATION Bilateral 12/13/2021   Procedure: POST PARTUM TUBAL LIGATION;  Surgeon: Truett Mainland, DO;  Location: [facility];  Service: Gynecology;  Laterality: Bilateral;    Family History  Problem Relation Age of Onset   Other Neg Hx    Hearing loss Neg Hx     Social History   Tobacco Use   Smoking status: Former    Packs/day: 0.25    Years: 10.00    Total pack years: 2.50    Types: Cigarettes   Smokeless tobacco: Never  Vaping Use   Vaping Use: Former    Quit date: 04/22/2021   Substances: Nicotine, Flavoring  Substance Use Topics   Alcohol use: Not Currently   Drug use: Yes    Frequency: 1.0 times per week    Types: Marijuana    No current facility-administered medications for this encounter.  Current Outpatient Medications:    cephALEXin (KEFLEX) 500 MG capsule, Take 1 capsule (500 mg total) by mouth 4 (four) times daily for 10 days., Disp: 40 capsule, Rfl: 0   phenazopyridine (PYRIDIUM) 200 MG tablet, Take 1 tablet (200 mg total) by mouth 3 (three) times daily as needed for pain., Disp: 6 tablet, Rfl: 0   Prenatal Vit-Fe Fumarate-FA (MULTIVITAMIN-PRENATAL) 27-0.8 MG TABS tablet, Take 1 tablet by mouth daily at 12 noon., Disp: , Rfl:   No Known Allergies   ROS  As noted in HPI.   Physical Exam  BP 124/71 (BP Location: Right Arm)   Pulse 77   Temp 98.2 F (36.8 C) (Oral)   Resp 12   LMP  (LMP Unknown)   SpO2 97%   Constitutional: Well developed, well nourished, no acute distress Eyes:  EOMI, conjunctiva normal bilaterally HENT: Normocephalic, atraumatic,mucus membranes moist Respiratory: Normal inspiratory effort Cardiovascular: Normal rate GI: nondistended.  No suprapubic, flank tenderness. Back: Positive left CVAT skin: No rash, skin intact Musculoskeletal: no deformities Neurologic:  Alert & oriented x 3, no focal neuro deficits Psychiatric: Speech and behavior appropriate   ED Course   Medications  cefTRIAXone (ROCEPHIN) injection 1 g (1 g Intramuscular Given 09/03/22 1240)    Orders Placed This Encounter  Procedures   Urine Culture    Standing Status:   Standing    Number of Occurrences:   1    Order Specific Question:   Indication    Answer:   Dysuria   POCT Urinalysis Dipstick (ED/UC)    Standing Status:   Standing    Number of Occurrences:   1   POC urine preg, ED (not at [facility])    Standing Status:   Standing    Number of Occurrences:   1    Results for orders placed or performed during the  hospital encounter of 09/03/22 (from the past 24 hour(s))  POCT Urinalysis Dipstick (ED/UC)     Status: Abnormal   Collection Time: 09/03/22 12:07 PM  Result Value Ref Range   Glucose, UA NEGATIVE NEGATIVE mg/dL   Bilirubin Urine NEGATIVE NEGATIVE   Ketones, ur NEGATIVE NEGATIVE mg/dL   Specific Gravity, Urine 1.020 1.005 - 1.030   Hgb urine dipstick MODERATE (A) NEGATIVE   pH 5.5 5.0 - 8.0   Protein, ur NEGATIVE NEGATIVE mg/dL   Urobilinogen, UA 0.2 0.0 - 1.0 mg/dL   Nitrite POSITIVE (A) NEGATIVE   Leukocytes,Ua SMALL (A) NEGATIVE  POC urine preg, ED (not at [facility])     Status: None   Collection Time: 09/03/22 12:17 PM  Result Value Ref Range   Preg Test, Ur NEGATIVE NEGATIVE   No results found.  ED Clinical Impression  1. Pyelonephritis      ED Assessment/Plan      History, physical, UA consistent with a UTI/early pyelonephritis with positive nitrites, small esterase, moderate hematuria.  Was sent urine off for culture to confirm antibiotic choice and diagnosis.  Previous labs reviewed.  Last UTI in March 3/22 was positive for E. coli that was sensitive to everything, specifically cephalosporins, except ampicillin.  Concern for pyelonephritis with left CVAT.  She appears nontoxic, is afebrile, has not taken an antipyretic in the past 6 hours, and otherwise has normal vitals.  Doubt obstructing nephrolithiasis at this time.  Will give 1 g of Rocephin for pyelonephritis, and send home with Keflex for 10 days, Pyridium for symptom relief.  Follow-up with PCP as needed.  ER return precautions given.  Discussed labs,  MDM, treatment plan, and plan for follow-up with patient. Discussed sn/sx that should prompt return to the ED. patient agrees with plan.   Meds ordered this encounter  Medications   cefTRIAXone (ROCEPHIN) injection 1 g   cephALEXin (KEFLEX) 500 MG capsule    Sig: Take 1 capsule (500 mg total) by mouth 4 (four) times daily for 10 days.    Dispense:  40 capsule     Refill:  0   phenazopyridine (PYRIDIUM) 200 MG tablet    Sig: Take 1 tablet (200 mg total) by mouth 3 (three) times daily as needed for pain.    Dispense:  6 tablet    Refill:  0      *This clinic note was created using [employer]. Therefore, there may be occasional mistakes despite careful proofreading.  ?    Melynda Ripple, MD 09/03/22 1239    Melynda Ripple, MD 09/03/22 1306

## 2022-09-03 NOTE — ED Triage Notes (Signed)
Pt is here for possible UTI x 4days .

## 2022-09-03 NOTE — Discharge Instructions (Signed)
I am concerned that you could have an early kidney infection.  I have given you a shot of Rocephin here.  I am sending your urine off for culture to make sure that we have you on the correct antibiotic.  Continue drinking plenty of fluids, finish the Keflex, even if you feel better.  The Pyridium will turn your urine orange, but will help with your symptoms.  You may also take 600 mg of ibuprofen with 1000 mg of Tylenol 3 times a day as needed for pain.

## 2022-09-05 LAB — URINE CULTURE: Culture: >=100000 — AB
